# Patient Record
Sex: Female | Born: 1965 | Race: Black or African American | Hispanic: No | Marital: Married | Smoking: Never smoker
Health system: Southern US, Community
[De-identification: ages and names within clinical notes are randomized; demographics above are authoritative.]

## PROBLEM LIST (undated history)

## (undated) DIAGNOSIS — I10 Essential (primary) hypertension: Secondary | ICD-10-CM

## (undated) DIAGNOSIS — K219 Gastro-esophageal reflux disease without esophagitis: Secondary | ICD-10-CM

## (undated) DIAGNOSIS — Z8489 Family history of other specified conditions: Secondary | ICD-10-CM

## (undated) HISTORY — PX: TUBAL LIGATION: SHX77

## (undated) HISTORY — DX: Gastro-esophageal reflux disease without esophagitis: K21.9

---
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
## 2007-08-26 ENCOUNTER — Ambulatory Visit: Payer: Self-pay

## 2007-09-29 ENCOUNTER — Ambulatory Visit: Payer: Self-pay

## 2008-07-21 ENCOUNTER — Ambulatory Visit: Payer: Self-pay

## 2009-09-23 ENCOUNTER — Ambulatory Visit: Payer: Self-pay | Admitting: Internal Medicine

## 2010-03-29 ENCOUNTER — Emergency Department: Payer: Self-pay | Admitting: Emergency Medicine

## 2010-07-21 ENCOUNTER — Ambulatory Visit: Payer: Self-pay | Admitting: Family Medicine

## 2011-06-06 ENCOUNTER — Ambulatory Visit: Payer: Self-pay | Admitting: Family Medicine

## 2011-06-07 ENCOUNTER — Ambulatory Visit: Payer: Self-pay | Admitting: Family Medicine

## 2011-06-26 ENCOUNTER — Ambulatory Visit: Payer: Self-pay | Admitting: Family Medicine

## 2011-09-20 ENCOUNTER — Ambulatory Visit: Payer: Self-pay | Admitting: Family Medicine

## 2012-05-21 LAB — HM PAP SMEAR: HM PAP: NEGATIVE

## 2012-09-26 ENCOUNTER — Ambulatory Visit: Payer: Self-pay | Admitting: Family Medicine

## 2013-01-06 ENCOUNTER — Ambulatory Visit: Payer: Self-pay | Admitting: Family Medicine

## 2013-01-10 ENCOUNTER — Ambulatory Visit: Payer: Self-pay | Admitting: Family Medicine

## 2013-01-15 ENCOUNTER — Ambulatory Visit: Payer: Self-pay | Admitting: Family Medicine

## 2013-10-02 ENCOUNTER — Ambulatory Visit: Payer: Self-pay | Admitting: Family Medicine

## 2013-10-17 ENCOUNTER — Emergency Department: Payer: Self-pay | Admitting: Emergency Medicine

## 2013-10-17 LAB — CBC
HCT: 39.1 % (ref 35.0–47.0)
HGB: 13.3 g/dL (ref 12.0–16.0)
MCV: 93 fL (ref 80–100)
Platelet: 247 10*3/uL (ref 150–440)
RDW: 13 % (ref 11.5–14.5)
WBC: 5.3 10*3/uL (ref 3.6–11.0)

## 2013-10-17 LAB — TROPONIN I: Troponin-I: <0.02 ng/mL

## 2013-10-17 LAB — BASIC METABOLIC PANEL
Calcium, Total: 8.4 mg/dL — ABNORMAL LOW (ref 8.5–10.1)
Chloride: 108 mmol/L — ABNORMAL HIGH (ref 98–107)
Co2: 25 mmol/L (ref 21–32)
Glucose: 99 mg/dL (ref 65–99)
Osmolality: 275 (ref 275–301)

## 2014-01-20 ENCOUNTER — Ambulatory Visit: Payer: Self-pay | Admitting: Family Medicine

## 2014-01-21 HISTORY — PX: BIOPSY ENDOMETRIAL: PRO11

## 2014-02-26 ENCOUNTER — Emergency Department: Payer: Self-pay | Admitting: Emergency Medicine

## 2014-02-26 LAB — CBC
HCT: 37.9 % (ref 35.0–47.0)
HGB: 12.7 g/dL (ref 12.0–16.0)
MCH: 32.2 pg (ref 26.0–34.0)
MCHC: 33.4 g/dL (ref 32.0–36.0)
MCV: 96 fL (ref 80–100)
PLATELETS: 228 10*3/uL (ref 150–440)
RBC: 3.93 10*6/uL (ref 3.80–5.20)
RDW: 13.2 % (ref 11.5–14.5)
WBC: 6.8 10*3/uL (ref 3.6–11.0)

## 2014-02-26 LAB — COMPREHENSIVE METABOLIC PANEL
AST: 34 U/L (ref 15–37)
Albumin: 3.8 g/dL (ref 3.4–5.0)
Alkaline Phosphatase: 85 U/L
Anion Gap: 4 — ABNORMAL LOW (ref 7–16)
BUN: 10 mg/dL (ref 7–18)
Bilirubin,Total: 0.6 mg/dL (ref 0.2–1.0)
CO2: 26 mmol/L (ref 21–32)
Calcium, Total: 9 mg/dL (ref 8.5–10.1)
Chloride: 109 mmol/L — ABNORMAL HIGH (ref 98–107)
Creatinine: 0.77 mg/dL (ref 0.60–1.30)
EGFR (African American): >60
EGFR (Non-African Amer.): >60
GLUCOSE: 90 mg/dL (ref 65–99)
Osmolality: 276 (ref 275–301)
Potassium: 3.8 mmol/L (ref 3.5–5.1)
SGPT (ALT): 30 U/L (ref 12–78)
Sodium: 139 mmol/L (ref 136–145)
Total Protein: 7.4 g/dL (ref 6.4–8.2)

## 2014-02-26 LAB — TROPONIN I: Troponin-I: <0.02 ng/mL

## 2014-02-26 LAB — LIPASE, BLOOD: Lipase: 146 U/L (ref 73–393)

## 2014-03-17 LAB — HM COLONOSCOPY

## 2014-10-12 ENCOUNTER — Ambulatory Visit: Payer: Self-pay | Admitting: Family Medicine

## 2015-02-28 ENCOUNTER — Emergency Department
Admission: EM | Admit: 2015-02-28 | Discharge: 2015-02-28 | Disposition: A | Discharge status: Home / Self Care | Payer: 59 | Attending: Emergency Medicine | Admitting: Emergency Medicine

## 2015-02-28 ENCOUNTER — Emergency Department: Payer: 59

## 2015-02-28 ENCOUNTER — Encounter: Payer: Self-pay | Admitting: Emergency Medicine

## 2015-02-28 ENCOUNTER — Ambulatory Visit: Admit: 2015-02-28 | Payer: 59

## 2015-02-28 DIAGNOSIS — S99921A Unspecified injury of right foot, initial encounter: Secondary | ICD-10-CM | POA: Diagnosis present

## 2015-02-28 DIAGNOSIS — Y9389 Activity, other specified: Secondary | ICD-10-CM | POA: Insufficient documentation

## 2015-02-28 DIAGNOSIS — Y9289 Other specified places as the place of occurrence of the external cause: Secondary | ICD-10-CM | POA: Diagnosis not present

## 2015-02-28 DIAGNOSIS — Y998 Other external cause status: Secondary | ICD-10-CM | POA: Diagnosis not present

## 2015-02-28 DIAGNOSIS — M79671 Pain in right foot: Secondary | ICD-10-CM

## 2015-02-28 DIAGNOSIS — X58XXXA Exposure to other specified factors, initial encounter: Secondary | ICD-10-CM | POA: Diagnosis not present

## 2015-02-28 NOTE — ED Provider Notes (Signed)
[facility] Emergency Department Provider Note  ____________________________________________  Time seen: Approximately 3:15 AM  I have reviewed the triage vital signs and the nursing notes.   HISTORY  Chief Complaint Foot Pain    HPI Ashley Ballard is a 49 y.o. female with a medical history of only obesity who presents with right foot pain after rolling her foot about 20 hours ago.  She is ambulatory with a slight limp.  She describes the pain is mild to moderate.  She is able to weight-bear without difficulty.  She did not hear or feel a pop or snap.  She does not have any swelling of the foot or joint.  She did not sustain any other injuries.   History reviewed. No pertinent past medical history.  There are no active problems to display for this patient.   Past Surgical History  Procedure Laterality Date  . Cholecystectomy    . Tubal ligation      No current outpatient prescriptions on file.  Allergies Review of patient's allergies indicates no known allergies.  History reviewed. No pertinent family history.  Social History History  Substance Use Topics  . Smoking status: Never Smoker   . Smokeless tobacco: Not on file  . Alcohol Use: No    Review of Systems Cardiovascular: Denies chest pain. Respiratory: Denies shortness of breath. Gastrointestinal: No abdominal pain.  No nausea, no vomiting.  No diarrhea.  No constipation. Musculoskeletal: Right foot pain, no other injuries  ____________________________________________   PHYSICAL EXAM:  VITAL SIGNS: ED Triage Vitals  Enc Vitals Group     BP 02/28/15 0214 145/92 mmHg     Pulse Rate 02/28/15 0214 93     Resp 02/28/15 0214 18     Temp 02/28/15 0214 98.2 F (36.8 C)     Temp Source 02/28/15 0214 Oral     SpO2 02/28/15 0214 100 %     Weight 02/28/15 0214 230 lb (104.327 kg)     Height 02/28/15 0214 5\' 5"  (1.651 m)     Head Cir --      Peak Flow --      Pain Score 02/28/15  0214 6     Pain Loc --      Pain Edu? --      Excl. in GC? --     Constitutional: Alert and oriented. Well appearing and in no acute distress. Eyes: Conjunctivae are normal. PERRL. EOMI. Head: Atraumatic. Cardiovascular: Normal rate, regular rhythm.  Respiratory: Normal respiratory effort.  No retractions. Lungs CTAB. Musculoskeletal: Mild tenderness to palpation of the medial aspect of the right foot.  No swelling, no ecchymosis.  Normal range of motion.  Slight limp with ambulation.  No ligamentous laxity.   Neurologic:  Normal speech and language. No gross focal neurologic deficits are appreciated. Speech is normal. No gait instability. Skin:  Skin is warm, dry and intact. No rash or injury noted. Psychiatric: Mood and affect are normal. Speech and behavior are normal.  ____________________________________________   LABS (all labs ordered are listed, but only abnormal results are displayed)  Labs Reviewed - No data to display ____________________________________________  RADIOLOGY  Dg Foot Complete Right  02/28/2015   CLINICAL DATA:  Midfoot pain after walking in heels and twisting foot 1 day ago.  EXAM: RIGHT FOOT COMPLETE - 3+ VIEW  COMPARISON:  None.  FINDINGS: There is no evidence of fracture or dislocation. There is no evidence of arthropathy or other focal bone abnormality. Soft tissues are  unremarkable.  IMPRESSION: Negative.   Electronically Signed   By: Burman NievesWilliam  Stevens M.D.   On: 02/28/2015 02:40   ____________________________________________   INITIAL IMPRESSION / ASSESSMENT AND PLAN / ED COURSE  Pertinent labs & imaging results that were available during my care of the patient were reviewed by me and considered in my medical decision making (see chart for details).  Foot pain without any evidence of sprain, fracture, or ligamentous injury.  No concern for a Lisfranc fracture given minor mechanism.  Recommended RICE and outpatient  follow-up. ____________________________________________   FINAL CLINICAL IMPRESSION(S) / ED DIAGNOSES  Final diagnoses:  Foot pain, right     Loleta Roseory Fabrice Dyal, MD 02/28/15 [id number]

## 2015-02-28 NOTE — ED Notes (Signed)
Ice Pack applied to Right Foot and elevated on pillow.

## 2015-02-28 NOTE — ED Notes (Signed)
Pt states she rolled her foot accidently and is having pain. Pt ambulatory.

## 2015-02-28 NOTE — Discharge Instructions (Signed)
Fortunately did not sustain any fractures of the bones in your foot and it does not appear that you have an ankle sprain.  Please read through the included information about rest, ice, compression, elevation, and follow up with your primary care doctor as needed.  Take Tylenol and ibuprofen as needed for pain.

## 2015-04-07 DIAGNOSIS — M5440 Lumbago with sciatica, unspecified side: Secondary | ICD-10-CM | POA: Insufficient documentation

## 2015-04-07 DIAGNOSIS — R9431 Abnormal electrocardiogram [ECG] [EKG]: Secondary | ICD-10-CM | POA: Insufficient documentation

## 2015-04-07 DIAGNOSIS — R030 Elevated blood-pressure reading, without diagnosis of hypertension: Secondary | ICD-10-CM

## 2015-04-07 DIAGNOSIS — E669 Obesity, unspecified: Secondary | ICD-10-CM | POA: Insufficient documentation

## 2015-04-07 DIAGNOSIS — E559 Vitamin D deficiency, unspecified: Secondary | ICD-10-CM | POA: Insufficient documentation

## 2015-04-07 DIAGNOSIS — N6019 Diffuse cystic mastopathy of unspecified breast: Secondary | ICD-10-CM | POA: Insufficient documentation

## 2015-04-07 DIAGNOSIS — IMO0001 Reserved for inherently not codable concepts without codable children: Secondary | ICD-10-CM | POA: Insufficient documentation

## 2015-04-07 DIAGNOSIS — R52 Pain, unspecified: Secondary | ICD-10-CM | POA: Insufficient documentation

## 2015-04-07 DIAGNOSIS — K219 Gastro-esophageal reflux disease without esophagitis: Secondary | ICD-10-CM | POA: Insufficient documentation

## 2015-04-07 DIAGNOSIS — N950 Postmenopausal bleeding: Secondary | ICD-10-CM | POA: Insufficient documentation

## 2015-04-08 ENCOUNTER — Ambulatory Visit (INDEPENDENT_AMBULATORY_CARE_PROVIDER_SITE_OTHER): Payer: 59 | Admitting: Physician Assistant

## 2015-04-08 ENCOUNTER — Encounter: Payer: Self-pay | Admitting: Physician Assistant

## 2015-04-08 VITALS — BP 144/98 | HR 92 | Temp 97.9°F | Resp 18 | Wt 231.6 lb

## 2015-04-08 DIAGNOSIS — R030 Elevated blood-pressure reading, without diagnosis of hypertension: Secondary | ICD-10-CM | POA: Diagnosis not present

## 2015-04-08 DIAGNOSIS — IMO0001 Reserved for inherently not codable concepts without codable children: Secondary | ICD-10-CM

## 2015-04-08 DIAGNOSIS — J019 Acute sinusitis, unspecified: Secondary | ICD-10-CM | POA: Diagnosis not present

## 2015-04-08 DIAGNOSIS — B9689 Other specified bacterial agents as the cause of diseases classified elsewhere: Secondary | ICD-10-CM

## 2015-04-08 MED ORDER — AMOXICILLIN 500 MG PO CAPS: 500.0000 mg | ORAL_CAPSULE | Freq: Three times a day (TID) | ORAL | Status: DC

## 2015-04-08 MED ORDER — MONTELUKAST SODIUM 10 MG PO TABS: 10.0000 mg | ORAL_TABLET | Freq: Every day | ORAL | Status: DC

## 2015-04-08 MED ORDER — FLUTICASONE PROPIONATE 50 MCG/ACT NA SUSP: 2.0000 | Freq: Every day | NASAL | Status: DC

## 2015-04-08 NOTE — Patient Instructions (Signed)

## 2015-04-08 NOTE — Progress Notes (Signed)
   Subjective:    Patient ID: Ashley Ballard, female    DOB: Sep 14, 1966, 49 y.o.   MRN: [id number]  Sinusitis This is a new problem. The current episode started in the past 7 days. The problem is unchanged. There has been no fever. She is experiencing no pain. Associated symptoms include congestion, headaches and sinus pressure. Pertinent negatives include no chills, coughing, diaphoresis, ear pain, hoarse voice, neck pain, shortness of breath, sneezing, sore throat or swollen glands. Past treatments include oral decongestants. The treatment provided no relief.      Review of Systems  Constitutional: Negative for fever, chills, diaphoresis and fatigue.  HENT: Positive for congestion, sinus pressure and voice change (mild hoarseness). Negative for ear discharge, ear pain, hoarse voice, postnasal drip, rhinorrhea, sneezing, sore throat, tinnitus and trouble swallowing.   Eyes: Negative for photophobia, pain, discharge and redness.  Respiratory: Negative for cough, chest tightness, shortness of breath and wheezing.   Cardiovascular: Negative for chest pain and palpitations.  Gastrointestinal: Negative for nausea, vomiting, diarrhea and constipation.  Musculoskeletal: Negative for arthralgias, neck pain and neck stiffness.  Neurological: Positive for headaches. Negative for dizziness, seizures, syncope, weakness and light-headedness.       Objective:   Physical Exam  Constitutional: She appears well-developed and well-nourished. No distress.  HENT:  Head: Normocephalic and atraumatic.  Right Ear: Hearing, tympanic membrane, external ear and ear canal normal.  Left Ear: Hearing, tympanic membrane, external ear and ear canal normal.  Nose: Mucosal edema present. No rhinorrhea. Right sinus exhibits maxillary sinus tenderness. Right sinus exhibits no frontal sinus tenderness. Left sinus exhibits maxillary sinus tenderness. Left sinus exhibits no frontal sinus tenderness.  Mouth/Throat: Uvula is  midline, oropharynx is clear and moist and mucous membranes are normal. No oropharyngeal exudate.  Eyes: Conjunctivae are normal. Pupils are equal, round, and reactive to light. Right eye exhibits no discharge. Left eye exhibits no discharge.  Neck: Normal range of motion. Neck supple. No tracheal deviation present. No thyromegaly present.  Cardiovascular: Normal rate, regular rhythm and intact distal pulses.  Exam reveals no gallop and no friction rub.   No murmur heard. Pulmonary/Chest: Effort normal and breath sounds normal. No respiratory distress. She has no wheezes. She has no rales. She exhibits no tenderness.  Lymphadenopathy:    She has no cervical adenopathy.  Skin: She is not diaphoretic.  Vitals reviewed.         Assessment & Plan:  1. Acute bacterial sinusitis Will treat with amoxicillin to help clear sinuses.  Advised to try afrin nose spray but to not use for more than 3 consecutive days.  Added singulair to daily allergy routine of flonase and claritin for better symptom control. - fluticasone (FLONASE) 50 MCG/ACT nasal spray; Place 2 sprays into both nostrils daily.  Dispense: 120 g; Refill: 3 - amoxicillin (AMOXIL) 500 MG capsule; Take 1 capsule (500 mg total) by mouth 3 (three) times daily.  Dispense: 30 capsule; Refill: 0 - montelukast (SINGULAIR) 10 MG tablet; Take 1 tablet (10 mg total) by mouth at bedtime.  Dispense: 30 tablet; Refill: 3  2. Elevated BP Advised to check BP at home and keep a log of readings.  If consistently elevated like today she is to call the office for further discussion.

## 2015-04-15 ENCOUNTER — Encounter: Payer: Self-pay | Admitting: Family Medicine

## 2015-04-15 ENCOUNTER — Ambulatory Visit (INDEPENDENT_AMBULATORY_CARE_PROVIDER_SITE_OTHER): Payer: 59 | Admitting: Family Medicine

## 2015-04-15 VITALS — BP 126/84 | HR 100 | Temp 98.0°F | Resp 16 | Wt 226.0 lb

## 2015-04-15 DIAGNOSIS — J0110 Acute frontal sinusitis, unspecified: Secondary | ICD-10-CM

## 2015-04-15 MED ORDER — CEFDINIR 300 MG PO CAPS: 300.0000 mg | ORAL_CAPSULE | Freq: Two times a day (BID) | ORAL | Status: DC

## 2015-04-15 NOTE — Progress Notes (Signed)
Subjective:     Patient ID: Ashley Ballard, female   DOB: [birth year], 49 y.o.   MRN: [id number]  HPI  Chief Complaint  Patient presents with  . Sinusitis    Pt was seen on 04/08/2015 by Antony Contras for sinusitis. Pt was prescribed Amoxicillin, without relief.  States sinus pressure and congestion are still present but not draining. Used Afrin Nasal Spray for two days only.   Review of Systems  Constitutional: Negative for fever and chills.  Respiratory: Negative for cough.        Objective:   Physical Exam  Constitutional: She appears well-developed and well-nourished. No distress.  HENT:  Right Ear: Tympanic membrane is not erythematous (dull in appearance).  Left Ear: Tympanic membrane normal.  Pulmonary/Chest: Breath sounds normal.  mild frontal sinus tenderness on percussion     Assessment:    1. Acute frontal sinusitis, recurrence not specified - cefdinir (OMNICEF) 300 MG capsule; Take 1 capsule (300 mg total) by mouth 2 (two) times daily.  Dispense: 20 capsule; Refill: 0    Plan:     Add Mucinex D

## 2015-04-15 NOTE — Patient Instructions (Signed)
Discussed use of Mucinex D and holding Singulair for now

## 2015-04-27 ENCOUNTER — Ambulatory Visit (INDEPENDENT_AMBULATORY_CARE_PROVIDER_SITE_OTHER): Payer: 59 | Admitting: Family Medicine

## 2015-04-27 ENCOUNTER — Encounter: Payer: Self-pay | Admitting: Family Medicine

## 2015-04-27 VITALS — BP 128/88 | HR 80 | Temp 98.0°F | Resp 16 | Ht 65.0 in | Wt 231.0 lb

## 2015-04-27 DIAGNOSIS — J0100 Acute maxillary sinusitis, unspecified: Secondary | ICD-10-CM

## 2015-04-27 DIAGNOSIS — M5440 Lumbago with sciatica, unspecified side: Secondary | ICD-10-CM

## 2015-04-27 MED ORDER — IBUPROFEN 800 MG PO TABS: 800.0000 mg | ORAL_TABLET | Freq: Three times a day (TID) | ORAL | Status: DC | PRN

## 2015-04-27 MED ORDER — PREDNISONE 10 MG PO TABS: ORAL_TABLET | ORAL | Status: DC

## 2015-04-27 MED ORDER — DOXYCYCLINE HYCLATE 100 MG PO TABS: 100.0000 mg | ORAL_TABLET | Freq: Two times a day (BID) | ORAL | Status: DC

## 2015-04-27 NOTE — Progress Notes (Signed)
Subjective:    Patient ID: Ashley Ballard, female    DOB: 09-Dec-1965, 49 y.o.   MRN: [id number]  Sinusitis This is a recurrent problem. The current episode started 1 to 4 weeks ago. There has been no fever. Associated symptoms include congestion, coughing, headaches and sinus pressure. Pertinent negatives include no ear pain, hoarse voice, shortness of breath, sore throat or swollen glands. Past treatments include antibiotics. The treatment provided moderate relief.  Patient reports that Omnicef did help her symptoms but has not resolved completly. Patient reports congestion is mostly in her head. Patient reports she is using OTC Mucinex D once a day as needed.    Review of Systems  HENT: Positive for congestion and sinus pressure. Negative for ear pain, hoarse voice and sore throat.   Respiratory: Positive for cough. Negative for shortness of breath.   Neurological: Positive for headaches.   Patient Active Problem List   Diagnosis Date Noted  . Abnormal ECG 04/07/2015  . Blood pressure elevated 04/07/2015  . 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 Acid reflux 04/07/2015  . Low back pain with sciatica 04/07/2015  . Adiposity 04/07/2015  . Hemorrhage, postmenopausal 04/07/2015  . Pain 04/07/2015  . Avitaminosis D 04/07/2015   Past Medical History  Diagnosis Date  . GERD (gastroesophageal reflux disease)    Current Outpatient Prescriptions on File Prior to Visit  Medication Sig  . calcium carbonate (OS-CAL) 600 MG TABS tablet Take 1 tablet by mouth daily.  . cholecalciferol (VITAMIN D) 1000 UNITS tablet Take 5 tablets by mouth daily.  . cyclobenzaprine (FLEXERIL) 5 MG tablet 1 tablet daily as needed.  Marland Kitchen dexlansoprazole (DEXILANT) 60 MG capsule Take 1 capsule by mouth daily.  . fluticasone (FLONASE) 50 MCG/ACT nasal spray Place 2 sprays into both nostrils daily.  Marland Kitchen ibuprofen (ADVIL,MOTRIN) 800 MG tablet Take 1 tablet by mouth 3 (three) times daily.  Marland Kitchen loratadine (CLARITIN REDITABS) 10  MG dissolvable tablet Take 1 tablet by mouth daily.  . montelukast (SINGULAIR) 10 MG tablet Take 1 tablet (10 mg total) by mouth at bedtime.  Marland Kitchen amoxicillin (AMOXIL) 500 MG capsule Take 1 capsule (500 mg total) by mouth 3 (three) times daily. (Patient not taking: Reported on 04/27/2015)  . cefdinir (OMNICEF) 300 MG capsule Take 1 capsule (300 mg total) by mouth 2 (two) times daily. (Patient not taking: Reported on 04/27/2015)   No current facility-administered medications on file prior to visit.   No Known Allergies Past Surgical History  Procedure Laterality Date  . Cholecystectomy    . Tubal ligation    . Biopsy endometrial  01/21/2014  . Breast cyst excision Right 1983   History   Social History  . Marital Status: Married    Spouse Name: Casimiro Needle  . Number of Children: 2  . Years of Education: N/A   Occupational History  . full time    Social History Main Topics  . Smoking status: Never Smoker   . Smokeless tobacco: Never Used  . Alcohol Use: No  . Drug Use: No  . Sexual Activity: Not on file   Other Topics Concern  . Not on file   Social History Narrative   Family History  Problem Relation Age of Onset  . Hypertension Mother   . Diabetes Mother   . Heart attack Paternal Uncle   . Kidney cancer Maternal Grandfather   . Pneumonia Paternal Grandmother   . Leukemia Paternal Grandfather        Objective:   Physical Exam  Constitutional: She is oriented to person, place, and time. She appears well-developed and well-nourished.  HENT:  Head: Normocephalic and atraumatic.  Right Ear: External ear normal. Tympanic membrane is retracted.  Left Ear: Tympanic membrane and external ear normal.  Nose: Mucosal edema and rhinorrhea present. Right sinus exhibits no maxillary sinus tenderness. Left sinus exhibits no maxillary sinus tenderness.  Mouth/Throat: Uvula is midline and oropharynx is clear and moist.  Eyes: Conjunctivae and EOM are normal. Pupils are equal, round, and  reactive to light.  Neck: Normal range of motion. Neck supple.  Cardiovascular: Normal rate and regular rhythm.   Pulmonary/Chest: Effort normal and breath sounds normal. She has no wheezes. She has no rales.  Neurological: She is alert and oriented to person, place, and time.    Blood pressure 128/88, pulse 80, temperature 98 F (36.7 C), temperature source Oral, resp. rate 16, height 5\' 5"  (1.651 m), weight 231 lb (104.781 kg), last menstrual period 03/14/2015, SpO2 98 %.       Assessment & Plan:   1. Acute maxillary sinusitis, recurrence not specified Better but not resolved. Will try medication as below. Consider repeat Prednisone if improves.  ENT referral if not improved.  - doxycycline (VIBRA-TABS) 100 MG tablet; Take 1 tablet (100 mg total) by mouth 2 (two) times daily.  Dispense: 20 tablet; Refill: 0 - predniSONE (DELTASONE) 10 MG tablet; 6 po for 1 day and then 5 po for 1 day and then 4 po for 1 day and 3 po for 1 day and then 2 po for 1 day and then 1 po for 1 day.  Dispense: 21 tablet; Refill: 0  2. Low back pain with sciatica, sciatica laterality unspecified, unspecified back pain laterality To be used as needed. Knows not to take with Prednisone.  - ibuprofen (ADVIL,MOTRIN) 800 MG tablet; Take 1 tablet (800 mg total) by mouth every 8 (eight) hours as needed.  Dispense: 90 tablet; Refill: 0  Lorie PhenixNancy Aunya Lemler, MD

## 2015-05-02 ENCOUNTER — Telehealth: Payer: Self-pay | Admitting: Family Medicine

## 2015-05-02 DIAGNOSIS — J328 Other chronic sinusitis: Secondary | ICD-10-CM

## 2015-05-02 DIAGNOSIS — J329 Chronic sinusitis, unspecified: Secondary | ICD-10-CM | POA: Insufficient documentation

## 2015-05-02 NOTE — Telephone Encounter (Signed)
Not sure what else to do. Put in referral to ENT to evaluate and treat.

## 2015-05-02 NOTE — Telephone Encounter (Signed)
Patient advised and will attend ENT appointment.

## 2015-05-02 NOTE — Telephone Encounter (Signed)
Pt would like to come in for a OV with Dr. Elease HashimotoMaloney this week to recheck her sinus problems. Can see be worked in if so, when? Thanks TNP

## 2015-05-02 NOTE — Telephone Encounter (Signed)
Pt returned call. Thanks TNP °

## 2015-05-03 ENCOUNTER — Telehealth: Payer: Self-pay | Admitting: Family Medicine

## 2015-05-03 ENCOUNTER — Encounter: Payer: Self-pay | Admitting: Family Medicine

## 2015-05-03 ENCOUNTER — Ambulatory Visit (INDEPENDENT_AMBULATORY_CARE_PROVIDER_SITE_OTHER): Payer: 59 | Admitting: Family Medicine

## 2015-05-03 VITALS — BP 136/80 | HR 92 | Temp 98.1°F | Resp 16 | Wt 230.0 lb

## 2015-05-03 DIAGNOSIS — J328 Other chronic sinusitis: Secondary | ICD-10-CM | POA: Diagnosis not present

## 2015-05-03 MED ORDER — CEFDINIR 300 MG PO CAPS: 300.0000 mg | ORAL_CAPSULE | Freq: Two times a day (BID) | ORAL | Status: DC

## 2015-05-03 NOTE — Telephone Encounter (Signed)
Made appointment for 05/03/2015 at 3:45.   Thanks,   -Vernona RiegerLaura

## 2015-05-03 NOTE — Progress Notes (Signed)
Patient ID: Ashley NavyFelicia B Nowakowski, female   DOB: 26-Jun-1966, 49 y.o.   MRN: [id number]       Patient: Ashley Ballard Female    DOB: 26-Jun-1966   49 y.o.   MRN: [id number] Visit Date: 05/03/2015  Today's Provider: Lorie PhenixNancy Culver Feighner, MD   Chief Complaint  Patient presents with  . Sinusitis   Subjective:    Sinusitis This is a chronic problem. The current episode started more than 1 month ago. The problem has been gradually worsening since onset. There has been no fever. Associated symptoms include chills, congestion, coughing, ear pain (Worse on the right.), headaches, neck pain (Right sided neck pain), sinus pressure and a sore throat. Pertinent negatives include no diaphoresis, shortness of breath or sneezing. Past treatments include antibiotics and spray decongestants.   Did stop Claritin and Singulair while on antibiotics.      No Known Allergies Previous Medications   CALCIUM CARBONATE (OS-CAL) 600 MG TABS TABLET    Take 1 tablet by mouth daily.   CHOLECALCIFEROL (VITAMIN D) 1000 UNITS TABLET    Take 5 tablets by mouth daily.   CYCLOBENZAPRINE (FLEXERIL) 5 MG TABLET    1 tablet daily as needed.   DEXLANSOPRAZOLE (DEXILANT) 60 MG CAPSULE    Take 1 capsule by mouth daily.   DOXYCYCLINE (VIBRA-TABS) 100 MG TABLET    Take 1 tablet (100 mg total) by mouth 2 (two) times daily.   FLUTICASONE (FLONASE) 50 MCG/ACT NASAL SPRAY    Place 2 sprays into both nostrils daily.   IBUPROFEN (ADVIL,MOTRIN) 800 MG TABLET    Take 1 tablet (800 mg total) by mouth every 8 (eight) hours as needed.   LORATADINE (CLARITIN REDITABS) 10 MG DISSOLVABLE TABLET    Take 1 tablet by mouth daily.   MONTELUKAST (SINGULAIR) 10 MG TABLET    Take 1 tablet (10 mg total) by mouth at bedtime.    Review of Systems  Constitutional: Positive for chills and fatigue. Negative for fever, diaphoresis, activity change, appetite change and unexpected weight change.  HENT: Positive for congestion, ear pain (Worse on the right.), postnasal  drip, sinus pressure and sore throat. Negative for ear discharge, hearing loss, mouth sores, nosebleeds, rhinorrhea, sneezing, tinnitus, trouble swallowing and voice change.   Respiratory: Positive for cough. Negative for apnea, choking, chest tightness, shortness of breath, wheezing and stridor.   Cardiovascular: Negative for chest pain, palpitations and leg swelling.  Gastrointestinal: Negative for nausea, vomiting, abdominal pain, diarrhea, constipation, blood in stool, abdominal distention, anal bleeding and rectal pain.  Musculoskeletal: Positive for neck pain (Right sided neck pain).  Neurological: Positive for headaches. Negative for dizziness, facial asymmetry and light-headedness.    History  Substance Use Topics  . Smoking status: Never Smoker   . Smokeless tobacco: Never Used  . Alcohol Use: No   Objective:   BP 136/80 mmHg  Pulse 92  Temp(Src) 98.1 F (36.7 C) (Oral)  Resp 16  Wt 230 lb (104.327 kg)  LMP 03/14/2015 (Exact Date)  Physical Exam  Constitutional: She is oriented to person, place, and time. She appears well-developed and well-nourished.  HENT:  Head: Normocephalic and atraumatic.  Right Ear: External ear normal.  Left Ear: Tympanic membrane and external ear normal.  Nose: Mucosal edema and rhinorrhea present. Right sinus exhibits no maxillary sinus tenderness. Left sinus exhibits no maxillary sinus tenderness.  Mouth/Throat: Uvula is midline and oropharynx is clear and moist.  Eyes: Conjunctivae and EOM are normal. Pupils are equal, round, and reactive to  light.  Neck: Normal range of motion. Neck supple.  Tender over right sternocleidomastoid.   Cardiovascular: Normal rate and regular rhythm.   Pulmonary/Chest: Effort normal and breath sounds normal. She has no wheezes. She has no rales.  Neurological: She is alert and oriented to person, place, and time.        Assessment & Plan:     1. Other chronic sinusitis Will finish antibiotic and add back  the Singulair and Mucinex D. Will also restart Omnicef for 2 weeks course. Follow with ENT as scheduled.   Follow up: Follow up as needed.       Lorie Phenix, MD  [facility]

## 2015-05-03 NOTE — Telephone Encounter (Signed)
Pt called wantingto be seen this afternoon for sinus problems.  When can we work her in .  Thanks, TP

## 2015-07-15 ENCOUNTER — Ambulatory Visit: Payer: 59 | Admitting: Family Medicine

## 2015-07-22 ENCOUNTER — Encounter: Payer: Self-pay | Admitting: Family Medicine

## 2015-07-22 ENCOUNTER — Ambulatory Visit (INDEPENDENT_AMBULATORY_CARE_PROVIDER_SITE_OTHER): Payer: 59 | Admitting: Family Medicine

## 2015-07-22 VITALS — BP 122/98 | HR 99 | Temp 97.9°F | Resp 18 | Wt 237.6 lb

## 2015-07-22 DIAGNOSIS — IMO0001 Reserved for inherently not codable concepts without codable children: Secondary | ICD-10-CM

## 2015-07-22 DIAGNOSIS — E669 Obesity, unspecified: Secondary | ICD-10-CM | POA: Diagnosis not present

## 2015-07-22 DIAGNOSIS — J019 Acute sinusitis, unspecified: Secondary | ICD-10-CM | POA: Diagnosis not present

## 2015-07-22 DIAGNOSIS — J328 Other chronic sinusitis: Secondary | ICD-10-CM

## 2015-07-22 DIAGNOSIS — R030 Elevated blood-pressure reading, without diagnosis of hypertension: Secondary | ICD-10-CM | POA: Diagnosis not present

## 2015-07-22 DIAGNOSIS — B9689 Other specified bacterial agents as the cause of diseases classified elsewhere: Secondary | ICD-10-CM

## 2015-07-22 MED ORDER — MONTELUKAST SODIUM 10 MG PO TABS: 10.0000 mg | ORAL_TABLET | Freq: Every day | ORAL | Status: DC

## 2015-07-22 NOTE — Progress Notes (Signed)
Patient ID: Ashley Ballard, female   DOB: 1965-10-30, 49 y.o.   MRN: [id number]    Subjective:  HPI 49 yo female here regarding her increased BMI. [facility] [facility]. Also, walking at work. Also, starting to count her calories.   Is very frustrated as she has not lost any weight. Does think related to menopause. Has form for work. Also with increased blood pressure since she has gained weight  Does monitor as home but not sure if cuff is accurate.  Was elevated at last visit. Did not have increased blood pressure when weight was lower . Very  Motivated to loose weight.    Prior to Admission medications   Medication Sig Start Date End Date Taking? Authorizing Provider  calcium carbonate (OS-CAL) 600 MG TABS tablet Take 1 tablet by mouth daily. 11/08/11  Yes Historical Provider, MD  cholecalciferol (VITAMIN D) 1000 UNITS tablet Take 5 tablets by mouth daily. 05/26/12  Yes Historical Provider, MD  cyclobenzaprine (FLEXERIL) 5 MG tablet 1 tablet daily as needed. 01/22/15  Yes Historical Provider, MD  dexlansoprazole (DEXILANT) 60 MG capsule Take 1 capsule by mouth daily. 02/19/14  Yes Historical Provider, MD  fluticasone (FLONASE) 50 MCG/ACT nasal spray Place 2 sprays into both nostrils daily. 04/08/15  Yes Alessandra Bevels Burnette, PA-C  ibuprofen (ADVIL,MOTRIN) 800 MG tablet Take 1 tablet (800 mg total) by mouth every 8 (eight) hours as needed. 04/27/15  Yes Lorie Phenix, MD  loratadine (CLARITIN REDITABS) 10 MG dissolvable tablet Take 1 tablet by mouth daily.   Yes Historical Provider, MD  montelukast (SINGULAIR) 10 MG tablet Take 1 tablet (10 mg total) by mouth at bedtime. 04/08/15  Yes Margaretann Loveless, PA-C    Patient Active Problem List   Diagnosis Date Noted  . Chronic sinusitis 05/02/2015  . Abnormal ECG 04/07/2015  . Blood pressure elevated 04/07/2015  . 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 Acid reflux 04/07/2015  . Low back pain with sciatica 04/07/2015  . Adiposity 04/07/2015  .  Hemorrhage, postmenopausal 04/07/2015  . Pain 04/07/2015  . Avitaminosis D 04/07/2015    Past Medical History  Diagnosis Date  . GERD (gastroesophageal reflux disease)     Social History   Social History  . Marital Status: Married    Spouse Name: Casimiro Needle  . Number of Children: 2  . Years of Education: N/A   Occupational History  . full time    Social History Main Topics  . Smoking status: Never Smoker   . Smokeless tobacco: Never Used  . Alcohol Use: No  . Drug Use: No  . Sexual Activity: Not on file   Other Topics Concern  . Not on file   Social History Narrative    No Known Allergies  Review of Systems  Constitutional: Negative.   HENT: Negative.   Eyes: Negative.   Respiratory: Negative.   Cardiovascular: Negative.   Gastrointestinal: Negative.   Genitourinary: Negative.   Musculoskeletal: Negative.   Skin: Negative.   Neurological: Negative.   Endo/Heme/Allergies: Negative.   Psychiatric/Behavioral: Negative.     Immunization History  Administered Date(s) Administered  . Tdap 10/27/2008   Objective:  BP 122/98 mmHg  Pulse 99  Temp(Src) 97.9 F (36.6 C) (Oral)  Resp 18  Wt 237 lb 9.6 oz (107.775 kg)  SpO2 99%  Physical Exam  Constitutional: She is oriented to person, place, and time and well-developed, well-nourished, and in no distress.  Cardiovascular: Normal rate and regular rhythm.  Pulmonary/Chest: Effort normal and breath sounds normal.  Neurological: She is alert and oriented to person, place, and time.    Lab Results  Component Value Date   WBC 6.8 02/26/2014   HGB 12.7 02/26/2014   HCT 37.9 02/26/2014   PLT 228 02/26/2014   GLUCOSE 90 02/26/2014     Assessment and Plan :  1. Acute bacterial sinusitis Improved. Continue medication.  - montelukast (SINGULAIR) 10 MG tablet; Take 1 tablet (10 mg total) by mouth at bedtime.  Dispense: 90 tablet; Refill: 3  2. Other chronic sinusitis Improved as above.   3.  Adiposity Spent greater than 25 minutes discussing and counseling regarding healthy diet and exercise and ways to make appropriated lifestyle changes. Going to try higher protein, low carbohydrate diet.  Also filled out form for work. Recheck in one month.  4. Blood pressure elevated Assuming will improve with weight loss. Will recheck in 4 weeks.      Lorie Phenix MD [facility] 07/22/2015 4:05 PM

## 2015-08-19 ENCOUNTER — Ambulatory Visit (INDEPENDENT_AMBULATORY_CARE_PROVIDER_SITE_OTHER): Payer: 59 | Admitting: Family Medicine

## 2015-08-19 ENCOUNTER — Encounter: Payer: Self-pay | Admitting: Family Medicine

## 2015-08-19 VITALS — BP 140/92 | HR 72 | Temp 97.8°F | Resp 16 | Ht 65.5 in | Wt 231.0 lb

## 2015-08-19 DIAGNOSIS — E669 Obesity, unspecified: Secondary | ICD-10-CM | POA: Diagnosis not present

## 2015-08-19 DIAGNOSIS — R030 Elevated blood-pressure reading, without diagnosis of hypertension: Secondary | ICD-10-CM | POA: Diagnosis not present

## 2015-08-19 DIAGNOSIS — IMO0001 Reserved for inherently not codable concepts without codable children: Secondary | ICD-10-CM

## 2015-08-19 NOTE — Progress Notes (Signed)
Patient ID: Ashley Ballard, female   DOB: 02-11-1966, 49 y.o.   MRN: [id number]         Patient: Ashley Ballard Female    DOB: 02-11-1966   49 y.o.   MRN: [id number] Visit Date: 08/19/2015  Today's Provider: Lorie PhenixNancy Shakai Dolley, MD   Chief Complaint  Patient presents with  . Hypertension  . Obesity   Subjective:    Hypertension This is a new problem. The current episode started 1 to 4 weeks ago. The problem is unchanged. Condition status: Home blood pressure readings are 120-140's/ 80's. Pertinent negatives include no anxiety, blurred vision, chest pain, headaches, malaise/fatigue, neck pain, orthopnea, palpitations, peripheral edema, PND, shortness of breath or sweats.   Obesity: Patient complains of obesity. Patient cites health as reasons for wanting to lose weight.  Obesity History Weight in late teens: 130's lb. Period of greatest weight gain: at least 25 lb during mid adult years Lowest adult weight: 135 Highest adult weight: 237 Amount of time at present weight: 231 lb.   History of Weight Loss Efforts Greatest amount of weight lost: 100 lb over 18 months (that was about 30 years ago) Amount of time that loss was maintained: Pt maintained that weight until her last child.  Successful weight loss techniques attempted: Eating low carb and exercising regularly. Unsuccessful weight loss techniques attempted: None  Current Exercise Habits Pt is exercising 5-6 days a week.  Goes to the gym in the evening.    Current Eating Habits Number of regular meals per day: 3 Number of snacking episodes per day: 2 Who shops for food? patient Who prepares food? patient Who eats with patient? patient and spouse Binge behavior?: no Purge behavior? no Anorexic behavior? no Eating precipitated by stress? no  Other Potential Contributing Factors Use of alcohol: average 0 drinks/week Use of medications that may cause weight gain none History of past abuse? none Psych History:  None Comorbidities: none       No Known Allergies Previous Medications   CALCIUM CARBONATE (OS-CAL) 600 MG TABS TABLET    Take 1 tablet by mouth daily.   CHOLECALCIFEROL (VITAMIN D) 1000 UNITS TABLET    Take 5 tablets by mouth daily.   CYCLOBENZAPRINE (FLEXERIL) 5 MG TABLET    1 tablet daily as needed.   DEXLANSOPRAZOLE (DEXILANT) 60 MG CAPSULE    Take 1 capsule by mouth daily.   FLUTICASONE (FLONASE) 50 MCG/ACT NASAL SPRAY    Place 2 sprays into both nostrils daily.   IBUPROFEN (ADVIL,MOTRIN) 800 MG TABLET    Take 1 tablet (800 mg total) by mouth every 8 (eight) hours as needed.   LORATADINE (CLARITIN REDITABS) 10 MG DISSOLVABLE TABLET    Take 1 tablet by mouth daily.   MONTELUKAST (SINGULAIR) 10 MG TABLET    Take 1 tablet (10 mg total) by mouth at bedtime.    Review of Systems  Constitutional: Negative for fever, chills, malaise/fatigue, diaphoresis, activity change, appetite change, fatigue and unexpected weight change.  Eyes: Negative for blurred vision.  Respiratory: Negative for apnea, cough, choking, chest tightness, shortness of breath, wheezing and stridor.   Cardiovascular: Negative for chest pain, palpitations, orthopnea, leg swelling and PND.  Gastrointestinal: Negative.   Musculoskeletal: Negative for neck pain.  Neurological: Negative for dizziness, light-headedness and headaches.    Social History  Substance Use Topics  . Smoking status: Never Smoker   . Smokeless tobacco: Never Used  . Alcohol Use: No   Objective:   BP 140/92 mmHg  Pulse 72  Temp(Src) 97.8 F (36.6 C) (Oral)  Resp 16  Ht 5' 5.5" (1.664 m)  Wt 231 lb (104.781 kg)  BMI 37.84 kg/m2  LMP 03/14/2015 (Exact Date)  Physical Exam  Constitutional: She is oriented to person, place, and time. She appears well-developed and well-nourished.  Neurological: She is alert and oriented to person, place, and time.  Psychiatric: She has a normal mood and affect. Her behavior is normal. Judgment and  thought content normal.       Assessment & Plan:     1. Adiposity Improving with lifestyle changes. Continue lifestyle changes.   2. Blood pressure elevated Improved. Recheck in 6 weeks.   Lorie Phenix, MD        Lorie Phenix, MD  [facility]

## 2015-09-27 ENCOUNTER — Ambulatory Visit (INDEPENDENT_AMBULATORY_CARE_PROVIDER_SITE_OTHER): Payer: 59 | Admitting: Family Medicine

## 2015-09-27 ENCOUNTER — Encounter: Payer: Self-pay | Admitting: Family Medicine

## 2015-09-27 VITALS — BP 146/98 | HR 80 | Temp 97.7°F | Resp 16 | Wt 234.0 lb

## 2015-09-27 DIAGNOSIS — E669 Obesity, unspecified: Secondary | ICD-10-CM

## 2015-09-27 DIAGNOSIS — I10 Essential (primary) hypertension: Secondary | ICD-10-CM | POA: Diagnosis not present

## 2015-09-27 MED ORDER — HYDROCHLOROTHIAZIDE 12.5 MG PO TABS: 12.5000 mg | ORAL_TABLET | Freq: Every day | ORAL | Status: DC

## 2015-09-27 NOTE — Progress Notes (Signed)
Subjective:     Patient ID: Ashley Ballard, female   DOB: 1966-04-28, 49 y.o.   MRN: [id number]  Chief Complaint  Patient presents with  . Hypertension  . Obesity   Follow-up for hypertension and weight loss. Last seen 08/19/15 140/92 and 231 lbs.  Hypertension  Keeping high protein, low carb diet. Cooking all meals day before, eating greens. Drinking water, decaffinated tea with Splenda. Rare: diet soda  Exercising 5-6 days/week for 1 hr with water aerobics, ellipticals, walking track.  Sleeping: 7-8 hrs, restful. Never diagnosed with sleep apnea; denies snoring; denies waking multiple times at night gasping for breath.   Would be open to starting a blood pressure medication. Has never been on hypertension meds before. Denies fluid retention problems.   Weight loss See diet and exercise above. Measures her weight at home, and has been constant.   Review of Systems  Constitutional: Negative.   HENT: Negative.   Eyes: Negative.   Respiratory: Negative.   Cardiovascular: Negative.   Gastrointestinal: Negative.   Endocrine: Negative.   Genitourinary: Negative.   Musculoskeletal: Negative.   Skin: Negative.   Neurological: Negative.   Psychiatric/Behavioral: Negative.     Patient Active Problem List   Diagnosis Date Noted  . Chronic sinusitis 05/02/2015  . Abnormal ECG 04/07/2015  . Blood pressure elevated 04/07/2015  . 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 Acid reflux 04/07/2015  . Low back pain with sciatica 04/07/2015  . Adiposity 04/07/2015  . Hemorrhage, postmenopausal 04/07/2015  . Pain 04/07/2015  . Avitaminosis D 04/07/2015   Previous Medications   CALCIUM CARBONATE (OS-CAL) 600 MG TABS TABLET    Take 1 tablet by mouth daily.   CHOLECALCIFEROL (VITAMIN D) 1000 UNITS TABLET    Take 5 tablets by mouth daily.   CYCLOBENZAPRINE (FLEXERIL) 5 MG TABLET    1 tablet daily as needed.   DEXLANSOPRAZOLE (DEXILANT) 60 MG CAPSULE    Take 1 capsule by mouth daily.   FLUTICASONE (FLONASE) 50 MCG/ACT NASAL SPRAY    Place 2 sprays into both nostrils daily.   IBUPROFEN (ADVIL,MOTRIN) 800 MG TABLET    Take 1 tablet (800 mg total) by mouth every 8 (eight) hours as needed.   LORATADINE (CLARITIN REDITABS) 10 MG DISSOLVABLE TABLET    Take 1 tablet by mouth daily.   MONTELUKAST (SINGULAIR) 10 MG TABLET    Take 1 tablet (10 mg total) by mouth at bedtime.   No Known Allergies Past Surgical History  Procedure Laterality Date  . Cholecystectomy    . Tubal ligation    . Biopsy endometrial  01/21/2014  . Breast cyst excision Right 1983   Family History  Problem Relation Age of Onset  . Hypertension Mother   . Diabetes Mother   . Heart attack Paternal Uncle   . Kidney cancer Maternal Grandfather   . Pneumonia Paternal Grandmother   . Leukemia Paternal Grandfather    Social History   Social History  . Marital Status: Married    Spouse Name: Casimiro NeedleMichael  . Number of Children: 2  . Years of Education: N/A   Occupational History  . full time    Social History Main Topics  . Smoking status: Never Smoker   . Smokeless tobacco: Never Used  . Alcohol Use: No  . Drug Use: No  . Sexual Activity: Not on file   Other Topics Concern  . Not on file   Social History Narrative       Objective:   Physical Exam  Constitutional: She is oriented to person, place, and time. She appears well-developed and well-nourished. No distress.  HENT:  Head: Normocephalic and atraumatic.  Right Ear: External ear normal.  Left Ear: External ear normal.  Mouth/Throat: Oropharynx is clear and moist. No oropharyngeal exudate.  Eyes: EOM are normal. Pupils are equal, round, and reactive to light. Right eye exhibits no discharge. Left eye exhibits no discharge.  Neck: Normal range of motion. Neck supple.  Cardiovascular: Normal rate, regular rhythm and normal heart sounds.  Exam reveals no gallop and no friction rub.   No murmur heard. Pulmonary/Chest: Effort normal and breath  sounds normal. No respiratory distress. She has no wheezes.  Abdominal: Soft. Bowel sounds are normal. She exhibits no distension. There is no tenderness.  Musculoskeletal: Normal range of motion. She exhibits no edema.  Neurological: She is alert and oriented to person, place, and time. No cranial nerve deficit. She exhibits normal muscle tone.  Skin: Skin is warm and dry. She is not diaphoretic. No erythema.  Psychiatric: She has a normal mood and affect.  Vitals reviewed.   BP 146/98 mmHg  Pulse 80  Temp(Src) 97.7 F (36.5 C) (Oral)  Resp 16  Wt 234 lb (106.142 kg)  LMP 03/14/2015 (Exact Date)     Assessment:     Elevated blood pressure still, will diagnose hypertension at this time. Also, working on weight loss, but less successful than previously.     Plan:     1. Essential hypertension New problem. Unchanged.  Will start medication and recheck labs in 4 weeks. Will monitor BP, recheck OV in 3 months.   - hydrochlorothiazide (HYDRODIURIL) 12.5 MG tablet; Take 1 tablet (12.5 mg total) by mouth daily.  Dispense: 90 tablet; Refill: 3  2. Adiposity Will try Weight Watchers.   Recheck OV in 3 months.   Lorie Phenix, MD

## 2015-10-18 ENCOUNTER — Telehealth: Payer: Self-pay

## 2015-10-18 DIAGNOSIS — I10 Essential (primary) hypertension: Secondary | ICD-10-CM

## 2015-10-18 NOTE — Telephone Encounter (Signed)
Dr. Venia Minks i printed a lab order for cbc and met c. Do you need anything else added? sd

## 2015-10-18 NOTE — Telephone Encounter (Signed)
Ok to print out. Thanks.

## 2015-10-18 NOTE — Telephone Encounter (Signed)
Pt advised to pick up lab order at front office. sd

## 2015-10-18 NOTE — Telephone Encounter (Signed)
Patient needs lab slip to recheck her levels per Dr. Elease HashimotoMaloney patient states-aa

## 2015-10-22 LAB — COMPREHENSIVE METABOLIC PANEL
A/G RATIO: 1.6 (ref 1.1–2.5)
ALK PHOS: 92 IU/L (ref 39–117)
ALT: 16 IU/L (ref 0–32)
AST: 14 IU/L (ref 0–40)
Albumin: 4.4 g/dL (ref 3.5–5.5)
BUN / CREAT RATIO: 16 (ref 9–23)
BUN: 12 mg/dL (ref 6–24)
Bilirubin Total: 0.9 mg/dL (ref 0.0–1.2)
CO2: 21 mmol/L (ref 18–29)
Calcium: 9.1 mg/dL (ref 8.7–10.2)
Chloride: 100 mmol/L (ref 96–106)
Creatinine, Ser: 0.75 mg/dL (ref 0.57–1.00)
GFR calc Af Amer: 107 mL/min/{1.73_m2} (ref >59)
GFR, EST NON AFRICAN AMERICAN: 93 mL/min/{1.73_m2} (ref >59)
GLOBULIN, TOTAL: 2.7 g/dL (ref 1.5–4.5)
Glucose: 80 mg/dL (ref 65–99)
Potassium: 4.3 mmol/L (ref 3.5–5.2)
SODIUM: 140 mmol/L (ref 134–144)
Total Protein: 7.1 g/dL (ref 6.0–8.5)

## 2015-10-22 LAB — CBC WITH DIFFERENTIAL/PLATELET
BASOS: 0 %
Basophils Absolute: 0 10*3/uL (ref 0.0–0.2)
EOS (ABSOLUTE): 0.2 10*3/uL (ref 0.0–0.4)
EOS: 4 %
HEMATOCRIT: 36.8 % (ref 34.0–46.6)
Hemoglobin: 12.4 g/dL (ref 11.1–15.9)
Immature Grans (Abs): 0 10*3/uL (ref 0.0–0.1)
Immature Granulocytes: 0 %
LYMPHS ABS: 2 10*3/uL (ref 0.7–3.1)
Lymphs: 32 %
MCH: 31.6 pg (ref 26.6–33.0)
MCHC: 33.7 g/dL (ref 31.5–35.7)
MCV: 94 fL (ref 79–97)
MONOCYTES: 10 %
MONOS ABS: 0.7 10*3/uL (ref 0.1–0.9)
NEUTROS ABS: 3.4 10*3/uL (ref 1.4–7.0)
Neutrophils: 54 %
Platelets: 267 10*3/uL (ref 150–379)
RBC: 3.92 x10E6/uL (ref 3.77–5.28)
RDW: 12.8 % (ref 12.3–15.4)
WBC: 6.2 10*3/uL (ref 3.4–10.8)

## 2015-10-25 ENCOUNTER — Telehealth: Payer: Self-pay

## 2015-10-25 ENCOUNTER — Other Ambulatory Visit: Payer: Self-pay | Admitting: Family Medicine

## 2015-10-25 DIAGNOSIS — Z1231 Encounter for screening mammogram for malignant neoplasm of breast: Secondary | ICD-10-CM

## 2015-10-25 NOTE — Telephone Encounter (Signed)
-----   Message from Lorie PhenixNancy Maloney, MD sent at 10/22/2015  7:52 AM EST ----- Labs stable. Please notify patient. Thanks.

## 2015-10-25 NOTE — Telephone Encounter (Signed)
Advised pt of lab results. Pt verbally acknowledges understanding. Cydni Reddoch Drozdowski, CMA   

## 2015-10-25 NOTE — Telephone Encounter (Signed)
LMTCB Thaddeus Evitts Drozdowski, CMA  

## 2015-10-28 ENCOUNTER — Ambulatory Visit
Admission: RE | Admit: 2015-10-28 | Discharge: 2015-10-28 | Disposition: A | Discharge status: Home / Self Care | Payer: 59 | Source: Ambulatory Visit | Attending: Family Medicine | Admitting: Family Medicine

## 2015-10-28 DIAGNOSIS — Z1231 Encounter for screening mammogram for malignant neoplasm of breast: Secondary | ICD-10-CM | POA: Diagnosis not present

## 2015-11-09 ENCOUNTER — Telehealth: Payer: Self-pay | Admitting: Family Medicine

## 2015-11-09 ENCOUNTER — Encounter: Payer: Self-pay | Admitting: Physician Assistant

## 2015-11-09 ENCOUNTER — Ambulatory Visit (INDEPENDENT_AMBULATORY_CARE_PROVIDER_SITE_OTHER): Payer: 59 | Admitting: Physician Assistant

## 2015-11-09 VITALS — BP 128/90 | HR 79 | Temp 98.2°F | Resp 16 | Wt 233.8 lb

## 2015-11-09 DIAGNOSIS — S39012A Strain of muscle, fascia and tendon of lower back, initial encounter: Secondary | ICD-10-CM | POA: Diagnosis not present

## 2015-11-09 DIAGNOSIS — M5440 Lumbago with sciatica, unspecified side: Secondary | ICD-10-CM | POA: Diagnosis not present

## 2015-11-09 DIAGNOSIS — M545 Low back pain, unspecified: Secondary | ICD-10-CM

## 2015-11-09 MED ORDER — CYCLOBENZAPRINE HCL 5 MG PO TABS: 5.0000 mg | ORAL_TABLET | Freq: Every day | ORAL | Status: DC

## 2015-11-09 MED ORDER — IBUPROFEN 800 MG PO TABS: 800.0000 mg | ORAL_TABLET | Freq: Three times a day (TID) | ORAL | Status: DC | PRN

## 2015-11-09 NOTE — Telephone Encounter (Signed)
I can see her another day. Stop HCTZ and Antony Contras can check some labs today if needed. Thanks

## 2015-11-09 NOTE — Progress Notes (Signed)
Patient: Ashley Ballard Female    DOB: 07/15/1966   50 y.o.   MRN: [id number] Visit Date: 11/09/2015  Today's Provider: Margaretann Loveless, PA-C   Chief Complaint  Patient presents with  . Back Pain   Subjective:    Back Pain This is a new problem. The current episode started in the past 7 days. The problem has been gradually improving since onset. Pain location: Right side back pain. The quality of the pain is described as aching. The pain does not radiate. The pain is at a severity of 2/10 (Saturday was like 10). The symptoms are aggravated by sitting (sitting for a long period of time). Stiffness is present in the morning. Pertinent negatives include no leg pain, numbness, tingling or weakness. She has tried muscle relaxant, heat, ice and walking for the symptoms. The treatment provided moderate relief.  Patient also states has not been taken the Hydrochlorothiazide for the last two days it was causing her to have tingling on her hands and feet.    No Known Allergies Previous Medications   CALCIUM CARBONATE (OS-CAL) 600 MG TABS TABLET    Take 1 tablet by mouth daily.   CHOLECALCIFEROL (VITAMIN D) 1000 UNITS TABLET    Take 5 tablets by mouth daily.   CYCLOBENZAPRINE (FLEXERIL) 5 MG TABLET    1 tablet daily as needed.   DEXLANSOPRAZOLE (DEXILANT) 60 MG CAPSULE    Take 1 capsule by mouth daily.   FLUTICASONE (FLONASE) 50 MCG/ACT NASAL SPRAY    Place 2 sprays into both nostrils daily.   HYDROCHLOROTHIAZIDE (HYDRODIURIL) 12.5 MG TABLET    Take 1 tablet (12.5 mg total) by mouth daily.   IBUPROFEN (ADVIL,MOTRIN) 800 MG TABLET    Take 1 tablet (800 mg total) by mouth every 8 (eight) hours as needed.   LORATADINE (CLARITIN REDITABS) 10 MG DISSOLVABLE TABLET    Take 1 tablet by mouth daily.   MONTELUKAST (SINGULAIR) 10 MG TABLET    Take 1 tablet (10 mg total) by mouth at bedtime.    Review of Systems  Respiratory: Negative.   Cardiovascular: Negative.   Gastrointestinal: Negative.    Musculoskeletal: Positive for back pain. Negative for arthralgias and gait problem.  Neurological: Positive for dizziness. Negative for tingling, weakness and numbness.    Social History  Substance Use Topics  . Smoking status: Never Smoker   . Smokeless tobacco: Never Used  . Alcohol Use: No   Objective:   BP 128/90 mmHg  Pulse 79  Temp(Src) 98.2 F (36.8 C) (Oral)  Resp 16  Wt 233 lb 12.8 oz (106.051 kg)  LMP 10/21/2015  Physical Exam  Constitutional: She appears well-developed and well-nourished. No distress.  Neck: Normal range of motion. Neck supple.  Cardiovascular: Normal rate, regular rhythm and normal heart sounds.  Exam reveals no gallop and no friction rub.   No murmur heard. Pulmonary/Chest: Effort normal and breath sounds normal. No respiratory distress. She has no wheezes. She has no rales.  Musculoskeletal:       Lumbar back: She exhibits tenderness and pain. She exhibits normal range of motion, no bony tenderness, no spasm and normal pulse.  Tenderness in muscles in lumbar region on right side. No radiating symptoms. No loss of motion. No weakness. Neg SLR.  Skin: She is not diaphoretic.  Vitals reviewed.     Assessment & Plan:     1. Right-sided low back pain without sciatica I do feel it is a muscle  strain.  She states it has been drastically improving since initial injury on Saturday. She has been taking flexeril and IBU for the pain. I will refill these medications as below. I also advised for her to use heating pad over the area for 15-20 minutes 3 times daily. I also advised she could use epsom salt soaks for inflammation also.  She is to call the office if symptoms worsen or do not improve. - cyclobenzaprine (FLEXERIL) 5 MG tablet; Take 1 tablet (5 mg total) by mouth at bedtime.  Dispense: 30 tablet; Refill: 0  2. Strain of lumbar paraspinal muscle, initial encounter See above medical treatment.  3. Low back pain with sciatica, sciatica laterality  unspecified, unspecified back pain laterality See above medical treatment. - ibuprofen (ADVIL,MOTRIN) 800 MG tablet; Take 1 tablet (800 mg total) by mouth every 8 (eight) hours as needed.  Dispense: 90 tablet; Refill: 0       Margaretann Loveless, PA-C  [facility]

## 2015-11-09 NOTE — Patient Instructions (Signed)

## 2015-11-09 NOTE — Telephone Encounter (Signed)
Pt stated that she thinks that the hydrochlorothiazide (HYDRODIURIL) 12.5 MG tablet is causing her hands and feet to tingle off and on. Pt is coming in to see Jenni at 3:30 for back pain. Pt wanted to see Dr. Elease Hashimoto but she has an appt and couldn't come to when Dr. Elease Hashimoto had an open appt. Please advise. Thanks TNP

## 2015-12-26 ENCOUNTER — Ambulatory Visit: Payer: 59 | Admitting: Family Medicine

## 2015-12-30 ENCOUNTER — Ambulatory Visit (INDEPENDENT_AMBULATORY_CARE_PROVIDER_SITE_OTHER): Payer: 59 | Admitting: Family Medicine

## 2015-12-30 ENCOUNTER — Encounter: Payer: Self-pay | Admitting: Family Medicine

## 2015-12-30 VITALS — BP 124/88 | HR 88 | Temp 98.0°F | Resp 16 | Wt 226.0 lb

## 2015-12-30 DIAGNOSIS — E669 Obesity, unspecified: Secondary | ICD-10-CM

## 2015-12-30 NOTE — Progress Notes (Signed)
Subjective:    Patient ID: Ashley NavyFelicia B Ballard, female    DOB: 04-28-1966, 50 y.o.   MRN: [id number]  HPI   Follow up for Hypertension  The patient was last seen for this 3 months ago. BP at LOV was 146/98. Changes made at last visit include start HCTZ 12.5 mg. Labs checked 10/25/2015 and were stable.  She reports poor compliance with treatment. Pt had to D/C HCTZ due to paresthesias. She feels that condition is Improved. Average BP readings at home have ranges from 106-127 /71-97.   ------------------------------------------------------------------------------------   Follow up for Adiposity  The patient was last seen for this 3 months ago. Weight at LOV was 234 lbs. Changes made at last visit include trying weight watchers.  She reports excellent compliance with treatment. She feels that condition is Improved. Pt has lost 8 pounds since LOV.   ------------------------------------------------------------------------------------     Review of Systems  Constitutional: Negative for fever, chills, diaphoresis, activity change, appetite change and fatigue.  Eyes: Negative for visual disturbance.  Respiratory: Negative for cough, shortness of breath and wheezing.   Cardiovascular: Negative for chest pain, palpitations and leg swelling.  Neurological: Negative for headaches.   BP 124/88 mmHg  Pulse 88  Temp(Src) 98 F (36.7 C) (Oral)  Resp 16  Wt 226 lb (102.513 kg)  LMP 11/19/2015   Patient Active Problem List   Diagnosis Date Noted  . Hypertension 09/27/2015  . Chronic sinusitis 05/02/2015  . Abnormal ECG 04/07/2015  . 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 Acid reflux 04/07/2015  . Low back pain with sciatica 04/07/2015  . Adiposity 04/07/2015  . Hemorrhage, postmenopausal 04/07/2015  . Pain 04/07/2015  . Avitaminosis D 04/07/2015   Past Medical History  Diagnosis Date  . GERD (gastroesophageal reflux disease)    Current Outpatient Prescriptions on File Prior to  Visit  Medication Sig  . calcium carbonate (OS-CAL) 600 MG TABS tablet Take 1 tablet by mouth daily.  . cholecalciferol (VITAMIN D) 1000 UNITS tablet Take 5 tablets by mouth daily.  . cyclobenzaprine (FLEXERIL) 5 MG tablet Take 1 tablet (5 mg total) by mouth at bedtime.  . fluticasone (FLONASE) 50 MCG/ACT nasal spray Place 2 sprays into both nostrils daily.  Marland Kitchen. ibuprofen (ADVIL,MOTRIN) 800 MG tablet Take 1 tablet (800 mg total) by mouth every 8 (eight) hours as needed.  . loratadine (CLARITIN REDITABS) 10 MG dissolvable tablet Take 1 tablet by mouth daily.  Marland Kitchen. dexlansoprazole (DEXILANT) 60 MG capsule Take 1 capsule by mouth daily. Reported on 12/30/2015  . montelukast (SINGULAIR) 10 MG tablet Take 1 tablet (10 mg total) by mouth at bedtime. (Patient not taking: Reported on 12/30/2015)   No current facility-administered medications on file prior to visit.   No Known Allergies Past Surgical History  Procedure Laterality Date  . Cholecystectomy    . Tubal ligation    . Biopsy endometrial  01/21/2014  . Breast cyst excision Right 1983   Social History   Social History  . Marital Status: Married    Spouse Name: Casimiro NeedleMichael  . Number of Children: 2  . Years of Education: N/A   Occupational History  . full time    Social History Main Topics  . Smoking status: Never Smoker   . Smokeless tobacco: Never Used  . Alcohol Use: No  . Drug Use: No  . Sexual Activity: Not on file   Other Topics Concern  . Not on file   Social History Narrative   Family History  Problem  Relation Age of Onset  . Hypertension Mother   . Diabetes Mother   . Breast cancer Mother 38  . Heart attack Paternal Uncle   . Kidney cancer Maternal Grandfather   . Pneumonia Paternal Grandmother   . Leukemia Paternal Grandfather   . Breast cancer Maternal Grandmother 85      Objective:   Physical Exam  Constitutional: She appears well-developed and well-nourished.  Cardiovascular: Normal rate and regular rhythm.     Pulmonary/Chest: Effort normal and breath sounds normal.  Psychiatric: She has a normal mood and affect. Her behavior is normal.  BP 124/88 mmHg  Pulse 88  Temp(Src) 98 F (36.7 C) (Oral)  Resp 16  Wt 226 lb (102.513 kg)  LMP 11/19/2015     Assessment & Plan:  1. Adiposity Greatly improved. Hypertension resolved with weight loss. Continue weight loss and exercise. Continue Weight Watchers.  Talked over 15 minutes about healthy lifestyle and lifestyle changes she has made and will continue to make over next few months. Recheck ov in 3 months.    Spent over 15 minutes in direct patient care.    Patient was seen and examined by Leo Grosser, MD, and note scribed by Allene Dillon, CMA. I have reviewed the document for accuracy and completeness and I agree with above. Leo Grosser, MD   Lorie Phenix, MD

## 2016-01-03 ENCOUNTER — Other Ambulatory Visit: Payer: Self-pay | Admitting: Physician Assistant

## 2016-01-06 ENCOUNTER — Encounter: Payer: Self-pay | Admitting: Physician Assistant

## 2016-01-06 ENCOUNTER — Telehealth: Payer: Self-pay | Admitting: Physician Assistant

## 2016-01-06 ENCOUNTER — Ambulatory Visit (INDEPENDENT_AMBULATORY_CARE_PROVIDER_SITE_OTHER): Payer: 59 | Admitting: Physician Assistant

## 2016-01-06 VITALS — BP 138/94 | HR 112 | Temp 98.5°F | Resp 18 | Wt 224.0 lb

## 2016-01-06 DIAGNOSIS — I10 Essential (primary) hypertension: Secondary | ICD-10-CM

## 2016-01-06 MED ORDER — LISINOPRIL 5 MG PO TABS: 5.0000 mg | ORAL_TABLET | Freq: Every day | ORAL | Status: DC

## 2016-01-06 NOTE — Telephone Encounter (Signed)
Pt was calling to get an RX but made an appt with ParmaJenni instead. Thanks TNP

## 2016-01-06 NOTE — Patient Instructions (Signed)
Hypertension Hypertension, commonly called high blood pressure, is when the force of blood pumping through your arteries is too strong. Your arteries are the blood vessels that carry blood from your heart throughout your body. A blood pressure reading consists of a higher number over a lower number, such as 110/72. The higher number (systolic) is the pressure inside your arteries when your heart pumps. The lower number (diastolic) is the pressure inside your arteries when your heart relaxes. Ideally you want your blood pressure below 120/80. Hypertension forces your heart to work harder to pump blood. Your arteries may become narrow or stiff. Having untreated or uncontrolled hypertension can cause heart attack, stroke, kidney disease, and other problems. RISK FACTORS Some risk factors for high blood pressure are controllable. Others are not.  Risk factors you cannot control include:   Race. You may be at higher risk if you are African American.  Age. Risk increases with age.  Gender. Men are at higher risk than women before age 45 years. After age 65, women are at higher risk than men. Risk factors you can control include:  Not getting enough exercise or physical activity.  Being overweight.  Getting too much fat, sugar, calories, or salt in your diet.  Drinking too much alcohol. SIGNS AND SYMPTOMS Hypertension does not usually cause signs or symptoms. Extremely high blood pressure (hypertensive crisis) may cause headache, anxiety, shortness of breath, and nosebleed. DIAGNOSIS To check if you have hypertension, your health care provider will measure your blood pressure while you are seated, with your arm held at the level of your heart. It should be measured at least twice using the same arm. Certain conditions can cause a difference in blood pressure between your right and left arms. A blood pressure reading that is higher than normal on one occasion does not mean that you need treatment. If  it is not clear whether you have high blood pressure, you may be asked to return on a different day to have your blood pressure checked again. Or, you may be asked to monitor your blood pressure at home for 1 or more weeks. TREATMENT Treating high blood pressure includes making lifestyle changes and possibly taking medicine. Living a healthy lifestyle can help lower high blood pressure. You may need to change some of your habits. Lifestyle changes may include:  Following the DASH diet. This diet is high in fruits, vegetables, and whole grains. It is low in salt, red meat, and added sugars.  Keep your sodium intake below 2,300 mg per day.  Getting at least 30-45 minutes of aerobic exercise at least 4 times per week.  Losing weight if necessary.  Not smoking.  Limiting alcoholic beverages.  Learning ways to reduce stress. Your health care provider may prescribe medicine if lifestyle changes are not enough to get your blood pressure under control, and if one of the following is true:  You are 18-59 years of age and your systolic blood pressure is above 140.  You are 60 years of age or older, and your systolic blood pressure is above 150.  Your diastolic blood pressure is above 90.  You have diabetes, and your systolic blood pressure is over 140 or your diastolic blood pressure is over 90.  You have kidney disease and your blood pressure is above 140/90.  You have heart disease and your blood pressure is above 140/90. Your personal target blood pressure may vary depending on your medical conditions, your age, and other factors. HOME CARE INSTRUCTIONS    Have your blood pressure rechecked as directed by your health care provider.   Take medicines only as directed by your health care provider. Follow the directions carefully. Blood pressure medicines must be taken as prescribed. The medicine does not work as well when you skip doses. Skipping doses also puts you at risk for  problems.  Do not smoke.   Monitor your blood pressure at home as directed by your health care provider. SEEK MEDICAL CARE IF:   You think you are having a reaction to medicines taken.  You have recurrent headaches or feel dizzy.  You have swelling in your ankles.  You have trouble with your vision. SEEK IMMEDIATE MEDICAL CARE IF:  You develop a severe headache or confusion.  You have unusual weakness, numbness, or feel faint.  You have severe chest or abdominal pain.  You vomit repeatedly.  You have trouble breathing. MAKE SURE YOU:   Understand these instructions.  Will watch your condition.  Will get help right away if you are not doing well or get worse.   This information is not intended to replace advice given to you by your health care provider. Make sure you discuss any questions you have with your health care provider.   Document Released: 10/08/2005 Document Revised: 02/22/2015 Document Reviewed: 07/31/2013 Elsevier Interactive Patient Education 2016 Elsevier Inc. DASH Eating Plan DASH stands for "Dietary Approaches to Stop Hypertension." The DASH eating plan is a healthy eating plan that has been shown to reduce high blood pressure (hypertension). Additional health benefits may include reducing the risk of type 2 diabetes mellitus, heart disease, and stroke. The DASH eating plan may also help with weight loss. WHAT DO I NEED TO KNOW ABOUT THE DASH EATING PLAN? For the DASH eating plan, you will follow these general guidelines:  Choose foods with a percent daily value for sodium of less than 5% (as listed on the food label).  Use salt-free seasonings or herbs instead of table salt or sea salt.  Check with your health care provider or pharmacist before using salt substitutes.  Eat lower-sodium products, often labeled as "lower sodium" or "no salt added."  Eat fresh foods.  Eat more vegetables, fruits, and low-fat dairy products.  Choose whole grains.  Look for the word "whole" as the first word in the ingredient list.  Choose fish and skinless chicken or turkey more often than red meat. Limit fish, poultry, and meat to 6 oz (170 g) each day.  Limit sweets, desserts, sugars, and sugary drinks.  Choose heart-healthy fats.  Limit cheese to 1 oz (28 g) per day.  Eat more home-cooked food and less restaurant, buffet, and fast food.  Limit fried foods.  Cook foods using methods other than frying.  Limit canned vegetables. If you do use them, rinse them well to decrease the sodium.  When eating at a restaurant, ask that your food be prepared with less salt, or no salt if possible. WHAT FOODS CAN I EAT? Seek help from a dietitian for individual calorie needs. Grains Whole grain or whole wheat bread. Brown rice. Whole grain or whole wheat pasta. Quinoa, bulgur, and whole grain cereals. Low-sodium cereals. Corn or whole wheat flour tortillas. Whole grain cornbread. Whole grain crackers. Low-sodium crackers. Vegetables Fresh or frozen vegetables (raw, steamed, roasted, or grilled). Low-sodium or reduced-sodium tomato and vegetable juices. Low-sodium or reduced-sodium tomato sauce and paste. Low-sodium or reduced-sodium canned vegetables.  Fruits All fresh, canned (in natural juice), or frozen fruits. Meat and Other   Protein Products Ground beef (85% or leaner), grass-fed beef, or beef trimmed of fat. Skinless chicken or turkey. Ground chicken or turkey. Pork trimmed of fat. All fish and seafood. Eggs. Dried beans, peas, or lentils. Unsalted nuts and seeds. Unsalted canned beans. Dairy Low-fat dairy products, such as skim or 1% milk, 2% or reduced-fat cheeses, low-fat ricotta or cottage cheese, or plain low-fat yogurt. Low-sodium or reduced-sodium cheeses. Fats and Oils Tub margarines without trans fats. Light or reduced-fat mayonnaise and salad dressings (reduced sodium). Avocado. Safflower, olive, or canola oils. Natural peanut or almond  butter. Other Unsalted popcorn and pretzels. The items listed above may not be a complete list of recommended foods or beverages. Contact your dietitian for more options. WHAT FOODS ARE NOT RECOMMENDED? Grains White bread. White pasta. White rice. Refined cornbread. Bagels and croissants. Crackers that contain trans fat. Vegetables Creamed or fried vegetables. Vegetables in a cheese sauce. Regular canned vegetables. Regular canned tomato sauce and paste. Regular tomato and vegetable juices. Fruits Dried fruits. Canned fruit in light or heavy syrup. Fruit juice. Meat and Other Protein Products Fatty cuts of meat. Ribs, chicken wings, bacon, sausage, bologna, salami, chitterlings, fatback, hot dogs, bratwurst, and packaged luncheon meats. Salted nuts and seeds. Canned beans with salt. Dairy Whole or 2% milk, cream, half-and-half, and cream cheese. Whole-fat or sweetened yogurt. Full-fat cheeses or blue cheese. Nondairy creamers and whipped toppings. Processed cheese, cheese spreads, or cheese curds. Condiments Onion and garlic salt, seasoned salt, table salt, and sea salt. Canned and packaged gravies. Worcestershire sauce. Tartar sauce. Barbecue sauce. Teriyaki sauce. Soy sauce, including reduced sodium. Steak sauce. Fish sauce. Oyster sauce. Cocktail sauce. Horseradish. Ketchup and mustard. Meat flavorings and tenderizers. Bouillon cubes. Hot sauce. Tabasco sauce. Marinades. Taco seasonings. Relishes. Fats and Oils Butter, stick margarine, lard, shortening, ghee, and bacon fat. Coconut, palm kernel, or palm oils. Regular salad dressings. Other Pickles and olives. Salted popcorn and pretzels. The items listed above may not be a complete list of foods and beverages to avoid. Contact your dietitian for more information. WHERE CAN I FIND MORE INFORMATION? National Heart, Lung, and [employer] Institute: www.nhlbi.nih.gov/health/health-topics/topics/dash/   This information is not intended to replace  advice given to you by your health care provider. Make sure you discuss any questions you have with your health care provider.   Document Released: 09/27/2011 Document Revised: 10/29/2014 Document Reviewed: 08/12/2013 Elsevier Interactive Patient Education 2016 Elsevier Inc.  

## 2016-01-06 NOTE — Progress Notes (Signed)
Patient ID: Ashley NavyFelicia B Dimon, female   DOB: 1966/03/24, 50 y.o.   MRN: [id number]     Patient: Ashley Ballard Female    DOB: 1966/03/24   50 y.o.   MRN: [id number] Visit Date: 01/06/2016  Today's Provider: Margaretann LovelessJennifer M Hanin Decook, PA-C   Chief Complaint  Patient presents with  . Hypertension   Subjective:    HPI Pt is here for blood pressure follow up. She has seen Dr. Elease HashimotoMaloney for her blood pressure and she had started her on HCTZ, it gave her tingling in her hands and feet. Her and Dr. Elease HashimotoMaloney discussed since pt is exercising and losing weight that they were going to not take any medications. But her blood pressure has been running 130's/90's the last few days and she would rather go ahead and start another medication instead of it running the way it has been.     No Known Allergies Previous Medications   CALCIUM CARBONATE (OS-CAL) 600 MG TABS TABLET    Take 1 tablet by mouth daily.   CHOLECALCIFEROL (VITAMIN D) 1000 UNITS TABLET    Take 5 tablets by mouth daily.   CYCLOBENZAPRINE (FLEXERIL) 5 MG TABLET    TAKE 1 TABLET (5 MG TOTAL) BY MOUTH AT BEDTIME.   DEXLANSOPRAZOLE (DEXILANT) 60 MG CAPSULE    Take 1 capsule by mouth daily. Reported on 12/30/2015   FLUTICASONE (FLONASE) 50 MCG/ACT NASAL SPRAY    Place 2 sprays into both nostrils daily.   IBUPROFEN (ADVIL,MOTRIN) 800 MG TABLET    Take 1 tablet (800 mg total) by mouth every 8 (eight) hours as needed.   LORATADINE (CLARITIN REDITABS) 10 MG DISSOLVABLE TABLET    Take 1 tablet by mouth daily.   MONTELUKAST (SINGULAIR) 10 MG TABLET    Take 1 tablet (10 mg total) by mouth at bedtime.    Review of Systems  Constitutional: Negative.   Eyes: Negative.   Respiratory: Negative.   Cardiovascular: Negative.   Gastrointestinal: Negative.   Endocrine: Negative.   Genitourinary: Negative.   Musculoskeletal: Negative.   Skin: Negative.   Allergic/Immunologic: Negative.   Neurological: Positive for headaches (thinks it is sinuses ).    Hematological: Negative.   Psychiatric/Behavioral: Negative.     Social History  Substance Use Topics  . Smoking status: Never Smoker   . Smokeless tobacco: Never Used  . Alcohol Use: No   Objective:   BP 138/94 mmHg  Pulse 112  Temp(Src) 98.5 F (36.9 C) (Oral)  Resp 18  Wt 224 lb (101.606 kg)  LMP 11/19/2015  Physical Exam  Constitutional: She appears well-developed and well-nourished. No distress.  Cardiovascular: Normal rate, regular rhythm and normal heart sounds.  Exam reveals no gallop and no friction rub.   No murmur heard. Pulmonary/Chest: Effort normal and breath sounds normal. No respiratory distress. She has no wheezes. She has no rales.  Musculoskeletal: She exhibits edema (very mild edema).  Skin: She is not diaphoretic.  Vitals reviewed.       Assessment & Plan:     1. Essential hypertension Will add lisinopril 5mg  to help with BP control. She does check her BP regularly at home approx 3 times per week. She will call if her BP continues to run high for increase in dose. She will also call if she has any adverse reactions to this medication.  If so may consider switching to a CCB for better control and tolerance.  She does not want to come back in for a 4 week  f/u but will call the office if needed. She already has a scheduled visit with Dr. Elease Hashimoto in the beginning of June where she can be seen for BP recheck if stable. - lisinopril (PRINIVIL,ZESTRIL) 5 MG tablet; Take 1 tablet (5 mg total) by mouth daily.  Dispense: 90 tablet; Refill: 1       Margaretann Loveless, PA-C  [facility]

## 2016-01-10 ENCOUNTER — Telehealth: Payer: Self-pay

## 2016-01-10 NOTE — Telephone Encounter (Signed)
Patient reports a red rash on breast area. Patient states she noticed the rash this morning, but denies itching. Patient started taking Lisinopril on Friday. Patient was concerned the rash may be a reaction to Lisinopril. Patient wanted to know if she could take Benadryl. Discussed with Maurine Ministerennis. Per Maurine Ministerennis advised patient that Benadryl may not help rash since she is not itching. Advised patient if rash is still present tomorrow she needs to follow up in office. Patient verbalized understanding.

## 2016-01-11 NOTE — Telephone Encounter (Signed)
Agreed.  Thank you

## 2016-01-17 ENCOUNTER — Emergency Department
Admission: EM | Admit: 2016-01-17 | Discharge: 2016-01-17 | Disposition: A | Discharge status: Home / Self Care | Payer: 59 | Attending: Emergency Medicine | Admitting: Emergency Medicine

## 2016-01-17 ENCOUNTER — Emergency Department: Payer: 59

## 2016-01-17 ENCOUNTER — Encounter: Payer: Self-pay | Admitting: Emergency Medicine

## 2016-01-17 DIAGNOSIS — Z7951 Long term (current) use of inhaled steroids: Secondary | ICD-10-CM | POA: Diagnosis not present

## 2016-01-17 DIAGNOSIS — Z79899 Other long term (current) drug therapy: Secondary | ICD-10-CM | POA: Insufficient documentation

## 2016-01-17 DIAGNOSIS — R109 Unspecified abdominal pain: Secondary | ICD-10-CM | POA: Diagnosis present

## 2016-01-17 DIAGNOSIS — Z5321 Procedure and treatment not carried out due to patient leaving prior to being seen by health care provider: Secondary | ICD-10-CM | POA: Diagnosis not present

## 2016-01-17 HISTORY — DX: Essential (primary) hypertension: I10

## 2016-01-17 LAB — BASIC METABOLIC PANEL
Anion gap: 9 (ref 5–15)
BUN: 13 mg/dL (ref 6–20)
CALCIUM: 9.1 mg/dL (ref 8.9–10.3)
CHLORIDE: 106 mmol/L (ref 101–111)
CO2: 20 mmol/L — ABNORMAL LOW (ref 22–32)
CREATININE: 0.73 mg/dL (ref 0.44–1.00)
GFR calc non Af Amer: >60 mL/min (ref >60)
Glucose, Bld: 96 mg/dL (ref 65–99)
Potassium: 3.9 mmol/L (ref 3.5–5.1)
SODIUM: 135 mmol/L (ref 135–145)

## 2016-01-17 LAB — CBC
HCT: 37.2 % (ref 35.0–47.0)
Hemoglobin: 12.8 g/dL (ref 12.0–16.0)
MCH: 32.2 pg (ref 26.0–34.0)
MCHC: 34.4 g/dL (ref 32.0–36.0)
MCV: 93.6 fL (ref 80.0–100.0)
PLATELETS: 292 10*3/uL (ref 150–440)
RBC: 3.97 MIL/uL (ref 3.80–5.20)
RDW: 12.6 % (ref 11.5–14.5)
WBC: 7.4 10*3/uL (ref 3.6–11.0)

## 2016-01-17 LAB — LIPASE, BLOOD: Lipase: 20 U/L (ref 11–51)

## 2016-01-17 LAB — TROPONIN I

## 2016-01-17 NOTE — ED Notes (Signed)
Patient presents to the ED with epigastric pain that woke her up out of sleep at 4am this morning.  Patient states, "It feels like something is stuck in my chest."  Patient reports history of acid reflux and states, "that always feels like a burning pain but this isn't like that."  Patient denies shortness of breath, dizziness, and nausea.  Patient denies pain radiating anywhere. Patient reports a history of htn.  Denies any other medical problems.

## 2016-03-09 ENCOUNTER — Ambulatory Visit: Payer: 59 | Admitting: Family Medicine

## 2016-03-30 ENCOUNTER — Ambulatory Visit (INDEPENDENT_AMBULATORY_CARE_PROVIDER_SITE_OTHER): Payer: 59 | Admitting: Family Medicine

## 2016-03-30 ENCOUNTER — Encounter: Payer: Self-pay | Admitting: Family Medicine

## 2016-03-30 VITALS — BP 122/84 | HR 80 | Temp 98.3°F | Resp 16 | Wt 219.0 lb

## 2016-03-30 DIAGNOSIS — Z13220 Encounter for screening for lipoid disorders: Secondary | ICD-10-CM

## 2016-03-30 DIAGNOSIS — R202 Paresthesia of skin: Secondary | ICD-10-CM

## 2016-03-30 DIAGNOSIS — J328 Other chronic sinusitis: Secondary | ICD-10-CM

## 2016-03-30 DIAGNOSIS — E559 Vitamin D deficiency, unspecified: Secondary | ICD-10-CM

## 2016-03-30 DIAGNOSIS — M5440 Lumbago with sciatica, unspecified side: Secondary | ICD-10-CM | POA: Diagnosis not present

## 2016-03-30 DIAGNOSIS — I10 Essential (primary) hypertension: Secondary | ICD-10-CM | POA: Diagnosis not present

## 2016-03-30 MED ORDER — IBUPROFEN 800 MG PO TABS: 800.0000 mg | ORAL_TABLET | Freq: Three times a day (TID) | ORAL | Status: DC | PRN

## 2016-03-30 MED ORDER — HYDROCHLOROTHIAZIDE 12.5 MG PO TABS: 12.5000 mg | ORAL_TABLET | Freq: Every day | ORAL | Status: DC

## 2016-03-30 MED ORDER — CYCLOBENZAPRINE HCL 5 MG PO TABS: 5.0000 mg | ORAL_TABLET | Freq: Every day | ORAL | Status: DC

## 2016-03-30 MED ORDER — FLUTICASONE PROPIONATE 50 MCG/ACT NA SUSP: 2.0000 | Freq: Every day | NASAL | Status: DC

## 2016-03-30 NOTE — Progress Notes (Addendum)
Subjective:    Patient ID: Ashley NavyFelicia B Moll, female    DOB: 1966-05-09, 50 y.o.   MRN: [id number]  HPI   Hypertension, follow-up:  BP Readings from Last 3 Encounters:  01/17/16 152/91  01/06/16 138/94  12/30/15 124/88    She was last seen for hypertension by Antony ContrasJenni 3 months ago.  BP at that visit was 138/94. Management since that visit includes adding Lisinopril 5 mg. She reports fair compliance with treatment. Pt reports the Lisinopril caused "mouth itching". Has since restarted HCTZ 12.5 mg. Needs refills on this. She is exercising 5 days a week x 1 hour. She is adherent to low salt diet.   Outside blood pressures are fluctuating, typically are 117-125/75-84.  She is experiencing none.  Patient denies chest pain, claudication, dyspnea, exertional chest pressure/discomfort, fatigue, irregular heart beat, lower extremity edema, near-syncope, orthopnea, palpitations and syncope.   Cardiovascular risk factors include hypertension.    Weight trend: stable Wt Readings from Last 3 Encounters:  01/17/16 224 lb (101.606 kg)  01/06/16 224 lb (101.606 kg)  12/30/15 226 lb (102.513 kg)    Current diet: in general, a "healthy" diet    ------------------------------------------------------------------------    Follow up for Back Pain  Pt currently taking Flexeril 5 mg and ibuprofen 800 mg PRN for this problem.  She reports excellent compliance with treatment. She feels that condition is Improved. She is not having side effects.  ------------------------------------------------------------------------------------       Follow up for Allergic Rhinitis  Pt currently is taking Singulair and Flonase for this problem.  She reports excellent compliance with treatment. She feels that condition is Improved.  ------------------------------------------------------------------------------------ Patient Active Problem List   Diagnosis Date Noted  . Essential hypertension  01/06/2016  . Chronic sinusitis 05/02/2015  . Abnormal ECG 04/07/2015  . 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 Acid reflux 04/07/2015  . Low back pain with sciatica 04/07/2015  . Adiposity 04/07/2015  . Hemorrhage, postmenopausal 04/07/2015  . Pain 04/07/2015  . Avitaminosis D 04/07/2015   Past Medical History  Diagnosis Date  . GERD (gastroesophageal reflux disease)   . Hypertension    Current Outpatient Prescriptions on File Prior to Visit  Medication Sig  . calcium carbonate (OS-CAL) 600 MG TABS tablet Take 1 tablet by mouth daily.  . cholecalciferol (VITAMIN D) 1000 UNITS tablet Take 5 tablets by mouth daily.  Marland Kitchen. dexlansoprazole (DEXILANT) 60 MG capsule Take 1 capsule by mouth daily. Reported on 12/30/2015  . loratadine (CLARITIN REDITABS) 10 MG dissolvable tablet Take 1 tablet by mouth daily.  . montelukast (SINGULAIR) 10 MG tablet Take 1 tablet (10 mg total) by mouth at bedtime.   No current facility-administered medications on file prior to visit.   Allergies  Allergen Reactions  . Lisinopril Itching    Mouth Itching   Past Surgical History  Procedure Laterality Date  . Tubal ligation    . Biopsy endometrial  01/21/2014  . Breast cyst excision Right 1983   Social History   Social History  . Marital Status: Married    Spouse Name: Casimiro NeedleMichael  . Number of Children: 2  . Years of Education: N/A   Occupational History  . full time    Social History Main Topics  . Smoking status: Never Smoker   . Smokeless tobacco: Never Used  . Alcohol Use: No  . Drug Use: No  . Sexual Activity: Not on file   Other Topics Concern  . Not on file   Social History Narrative  Family History  Problem Relation Age of Onset  . Hypertension Mother   . Diabetes Mother   . Breast cancer Mother 84  . Heart attack Paternal Uncle   . Kidney cancer Maternal Grandfather   . Pneumonia Paternal Grandmother   . Leukemia Paternal Grandfather   . Breast cancer Maternal Grandmother 30      .result    Review of Systems  Constitutional: Negative for fever, chills, diaphoresis, activity change, appetite change, fatigue and unexpected weight change.  HENT: Negative for postnasal drip, rhinorrhea, sinus pressure, sneezing and sore throat.   Respiratory: Negative for cough, shortness of breath and wheezing.   Cardiovascular: Negative for chest pain, palpitations and leg swelling.       Objective:   Physical Exam  Constitutional: She appears well-developed and well-nourished.  Cardiovascular: Normal rate and regular rhythm.   Pulmonary/Chest: Effort normal and breath sounds normal. No respiratory distress.  Psychiatric: She has a normal mood and affect. Her behavior is normal.   BP 122/84 mmHg  Pulse 80  Temp(Src) 98.3 F (36.8 C) (Oral)  Resp 16  Wt 219 lb (99.338 kg)     Assessment & Plan:  1. Essential hypertension Reaction to Ace inhibitor. Added to allergy list. Doing okay on HCTZ. Was concerned because she experienced paresthesias in the past while on this medication. Labs at that time were WNL. Paresthesias were most likely secondary to sciatica. Restart HCTZ. Check labs. - hydrochlorothiazide (HYDRODIURIL) 12.5 MG tablet; Take 1 tablet (12.5 mg total) by mouth daily.  Dispense: 90 tablet; Refill: 3 - TSH - CBC with Differential/Platelet - Comprehensive metabolic panel  2. Other chronic sinusitis Stable. Refills provided. - fluticasone (FLONASE) 50 MCG/ACT nasal spray; Place 2 sprays into both nostrils daily.  Dispense: 16 g; Refill: 11  3. Low back pain with sciatica, sciatica laterality unspecified, unspecified back pain laterality Stable. Refills provided.  - ibuprofen (ADVIL,MOTRIN) 800 MG tablet; Take 1 tablet (800 mg total) by mouth every 8 (eight) hours as needed.  Dispense: 90 tablet; Refill: 1 - cyclobenzaprine (FLEXERIL) 5 MG tablet; Take 1 tablet (5 mg total) by mouth at bedtime.  Dispense: 30 tablet; Refill: 5  4. Avitaminosis D FU  pending lab results. - VITAMIN D 25 Hydroxy (Vit-D Deficiency, Fractures)  5. Paresthesia Improved. Most likely was sciatica related. Will check labs to rule out deficiency. - Vitamin B12  6. Lipid screening FU pending lab results. - Lipid panel   Patient seen and examined by Leo Grosser, MD, and note scribed by Allene Dillon, CMA.   I have reviewed the document for accuracy and completeness and I agree with above. Leo Grosser, MD   Lorie Phenix, MD

## 2016-03-31 ENCOUNTER — Other Ambulatory Visit: Payer: Self-pay | Admitting: Family Medicine

## 2016-03-31 LAB — COMPREHENSIVE METABOLIC PANEL WITH GFR
ALT: 20 IU/L (ref 0–32)
AST: 19 IU/L (ref 0–40)
Albumin/Globulin Ratio: 1.9 (ref 1.2–2.2)
Albumin: 4.5 g/dL (ref 3.5–5.5)
Alkaline Phosphatase: 72 IU/L (ref 39–117)
BUN/Creatinine Ratio: 19 (ref 9–23)
BUN: 14 mg/dL (ref 6–24)
Bilirubin Total: 0.9 mg/dL (ref 0.0–1.2)
CO2: 23 mmol/L (ref 18–29)
Calcium: 9.5 mg/dL (ref 8.7–10.2)
Chloride: 101 mmol/L (ref 96–106)
Creatinine, Ser: 0.73 mg/dL (ref 0.57–1.00)
GFR calc Af Amer: 110 mL/min/1.73 (ref >59)
GFR calc non Af Amer: 95 mL/min/1.73 (ref >59)
Globulin, Total: 2.4 g/dL (ref 1.5–4.5)
Glucose: 82 mg/dL (ref 65–99)
Potassium: 4.3 mmol/L (ref 3.5–5.2)
Sodium: 141 mmol/L (ref 134–144)
Total Protein: 6.9 g/dL (ref 6.0–8.5)

## 2016-03-31 LAB — CBC WITH DIFFERENTIAL/PLATELET
BASOS ABS: 0 10*3/uL (ref 0.0–0.2)
BASOS: 0 %
EOS (ABSOLUTE): 0.1 10*3/uL (ref 0.0–0.4)
Eos: 2 %
Hematocrit: 36.4 % (ref 34.0–46.6)
Hemoglobin: 12.3 g/dL (ref 11.1–15.9)
Immature Grans (Abs): 0 10*3/uL (ref 0.0–0.1)
Immature Granulocytes: 0 %
LYMPHS ABS: 1.8 10*3/uL (ref 0.7–3.1)
Lymphs: 32 %
MCH: 31.4 pg (ref 26.6–33.0)
MCHC: 33.8 g/dL (ref 31.5–35.7)
MCV: 93 fL (ref 79–97)
MONOS ABS: 0.4 10*3/uL (ref 0.1–0.9)
Monocytes: 7 %
NEUTROS ABS: 3.2 10*3/uL (ref 1.4–7.0)
Neutrophils: 59 %
PLATELETS: 234 10*3/uL (ref 150–379)
RBC: 3.92 x10E6/uL (ref 3.77–5.28)
RDW: 13.2 % (ref 12.3–15.4)
WBC: 5.5 10*3/uL (ref 3.4–10.8)

## 2016-03-31 LAB — VITAMIN B12: Vitamin B-12: 309 pg/mL (ref 211–946)

## 2016-03-31 LAB — LIPID PANEL
CHOL/HDL RATIO: 2.8 ratio (ref 0.0–4.4)
CHOLESTEROL TOTAL: 131 mg/dL (ref 100–199)
HDL: 46 mg/dL (ref >39)
LDL CALC: 73 mg/dL (ref 0–99)
Triglycerides: 60 mg/dL (ref 0–149)
VLDL Cholesterol Cal: 12 mg/dL (ref 5–40)

## 2016-03-31 LAB — TSH: TSH: 1.42 u[IU]/mL (ref 0.450–4.500)

## 2016-03-31 LAB — VITAMIN D 25 HYDROXY (VIT D DEFICIENCY, FRACTURES): Vit D, 25-Hydroxy: 49.6 ng/mL (ref 30.0–100.0)

## 2016-03-31 NOTE — Addendum Note (Signed)
Addended by: Leo GrosserMALONEY, Gracia Saggese J on: 03/31/2016 06:44 AM   Modules accepted: Kipp BroodSmartSet

## 2016-04-02 ENCOUNTER — Telehealth: Payer: Self-pay

## 2016-04-02 NOTE — Telephone Encounter (Signed)
LMTCB

## 2016-04-02 NOTE — Telephone Encounter (Signed)
-----   Message from Lorie PhenixNancy Maloney, MD sent at 03/31/2016  6:45 AM EDT ----- Labs look very good except for low normal B12. Would add B12 supplement 1000 mg daily. Thanks.

## 2016-04-02 NOTE — Telephone Encounter (Signed)
Advised pt of lab results. Pt verbally acknowledges understanding. Emily Drozdowski, CMA   

## 2016-06-09 ENCOUNTER — Other Ambulatory Visit: Payer: Self-pay | Admitting: Family Medicine

## 2016-06-09 DIAGNOSIS — M5440 Lumbago with sciatica, unspecified side: Secondary | ICD-10-CM

## 2016-07-27 ENCOUNTER — Encounter: Payer: Self-pay | Admitting: Physician Assistant

## 2016-07-27 ENCOUNTER — Ambulatory Visit (INDEPENDENT_AMBULATORY_CARE_PROVIDER_SITE_OTHER): Payer: 59 | Admitting: Physician Assistant

## 2016-07-27 VITALS — BP 130/80 | HR 87 | Temp 97.5°F | Resp 16 | Wt 221.8 lb

## 2016-07-27 DIAGNOSIS — E6609 Other obesity due to excess calories: Secondary | ICD-10-CM | POA: Diagnosis not present

## 2016-07-27 DIAGNOSIS — E538 Deficiency of other specified B group vitamins: Secondary | ICD-10-CM | POA: Diagnosis not present

## 2016-07-27 DIAGNOSIS — Z6836 Body mass index (BMI) 36.0-36.9, adult: Secondary | ICD-10-CM

## 2016-07-27 DIAGNOSIS — I10 Essential (primary) hypertension: Secondary | ICD-10-CM | POA: Diagnosis not present

## 2016-07-27 NOTE — Patient Instructions (Signed)
DASH Eating Plan  DASH stands for "Dietary Approaches to Stop Hypertension." The DASH eating plan is a healthy eating plan that has been shown to reduce high blood pressure (hypertension). Additional health benefits may include reducing the risk of type 2 diabetes mellitus, heart disease, and stroke. The DASH eating plan may also help with weight loss.  WHAT DO I NEED TO KNOW ABOUT THE DASH EATING PLAN?  For the DASH eating plan, you will follow these general guidelines:  · Choose foods with a percent daily value for sodium of less than 5% (as listed on the food label).  · Use salt-free seasonings or herbs instead of table salt or sea salt.  · Check with your health care provider or pharmacist before using salt substitutes.  · Eat lower-sodium products, often labeled as "lower sodium" or "no salt added."  · Eat fresh foods.  · Eat more vegetables, fruits, and low-fat dairy products.  · Choose whole grains. Look for the word "whole" as the first word in the ingredient list.  · Choose fish and skinless chicken or turkey more often than red meat. Limit fish, poultry, and meat to 6 oz (170 g) each day.  · Limit sweets, desserts, sugars, and sugary drinks.  · Choose heart-healthy fats.  · Limit cheese to 1 oz (28 g) per day.  · Eat more home-cooked food and less restaurant, buffet, and fast food.  · Limit fried foods.  · Cook foods using methods other than frying.  · Limit canned vegetables. If you do use them, rinse them well to decrease the sodium.  · When eating at a restaurant, ask that your food be prepared with less salt, or no salt if possible.  WHAT FOODS CAN I EAT?  Seek help from a dietitian for individual calorie needs.  Grains  Whole grain or whole wheat bread. Brown rice. Whole grain or whole wheat pasta. Quinoa, bulgur, and whole grain cereals. Low-sodium cereals. Corn or whole wheat flour tortillas. Whole grain cornbread. Whole grain crackers. Low-sodium crackers.  Vegetables  Fresh or frozen vegetables  (raw, steamed, roasted, or grilled). Low-sodium or reduced-sodium tomato and vegetable juices. Low-sodium or reduced-sodium tomato sauce and paste. Low-sodium or reduced-sodium canned vegetables.   Fruits  All fresh, canned (in natural juice), or frozen fruits.  Meat and Other Protein Products  Ground beef (85% or leaner), grass-fed beef, or beef trimmed of fat. Skinless chicken or turkey. Ground chicken or turkey. Pork trimmed of fat. All fish and seafood. Eggs. Dried beans, peas, or lentils. Unsalted nuts and seeds. Unsalted canned beans.  Dairy  Low-fat dairy products, such as skim or 1% milk, 2% or reduced-fat cheeses, low-fat ricotta or cottage cheese, or plain low-fat yogurt. Low-sodium or reduced-sodium cheeses.  Fats and Oils  Tub margarines without trans fats. Light or reduced-fat mayonnaise and salad dressings (reduced sodium). Avocado. Safflower, olive, or canola oils. Natural peanut or almond butter.  Other  Unsalted popcorn and pretzels.  The items listed above may not be a complete list of recommended foods or beverages. Contact your dietitian for more options.  WHAT FOODS ARE NOT RECOMMENDED?  Grains  White bread. White pasta. White rice. Refined cornbread. Bagels and croissants. Crackers that contain trans fat.  Vegetables  Creamed or fried vegetables. Vegetables in a cheese sauce. Regular canned vegetables. Regular canned tomato sauce and paste. Regular tomato and vegetable juices.  Fruits  Dried fruits. Canned fruit in light or heavy syrup. Fruit juice.  Meat and Other Protein   Products  Fatty cuts of meat. Ribs, chicken wings, bacon, sausage, bologna, salami, chitterlings, fatback, hot dogs, bratwurst, and packaged luncheon meats. Salted nuts and seeds. Canned beans with salt.  Dairy  Whole or 2% milk, cream, half-and-half, and cream cheese. Whole-fat or sweetened yogurt. Full-fat cheeses or blue cheese. Nondairy creamers and whipped toppings. Processed cheese, cheese spreads, or cheese  curds.  Condiments  Onion and garlic salt, seasoned salt, table salt, and sea salt. Canned and packaged gravies. Worcestershire sauce. Tartar sauce. Barbecue sauce. Teriyaki sauce. Soy sauce, including reduced sodium. Steak sauce. Fish sauce. Oyster sauce. Cocktail sauce. Horseradish. Ketchup and mustard. Meat flavorings and tenderizers. Bouillon cubes. Hot sauce. Tabasco sauce. Marinades. Taco seasonings. Relishes.  Fats and Oils  Butter, stick margarine, lard, shortening, ghee, and bacon fat. Coconut, palm kernel, or palm oils. Regular salad dressings.  Other  Pickles and olives. Salted popcorn and pretzels.  The items listed above may not be a complete list of foods and beverages to avoid. Contact your dietitian for more information.  WHERE CAN I FIND MORE INFORMATION?  National Heart, [employer], [employer] Institute: www.nhlbi.nih.gov/health/health-topics/topics/dash/     This information is not intended to replace advice given to you by your health care provider. Make sure you discuss any questions you have with your health care provider.     Document Released: 09/27/2011 Document Revised: 10/29/2014 Document Reviewed: 08/12/2013  Elsevier Interactive Patient Education ©2016 Elsevier Inc.

## 2016-07-27 NOTE — Progress Notes (Signed)
Patient: Ashley Ballard Female    DOB: 03/16/1966   50 y.o.   MRN: [id number] Visit Date: 07/27/2016  Today's Provider: Margaretann Loveless, PA-C   Chief Complaint  Patient presents with  . Health Screening Form  . Hypertension    Follow-up   Subjective:    HPI Patient is here to get her health screening appeal form to be fill out and also to follow-up on Hypertension. Patient reports she gets her Influenza vaccine through work.   Hypertension, follow-up:  BP Readings from Last 3 Encounters:  07/27/16 130/80  03/30/16 122/84  01/17/16 (!) 152/91    She was last seen for hypertension 3 months ago.  BP at that visit was 122/84. Management since that visit includes Rest. HCTZ. She reports excellent compliance with treatment. She is not having side effects.  She is exercising. Walks 5 days a week. She is doing weight watchers. She is adherent to low salt diet.   Outside blood pressures are 120's/80's. She is experiencing none.  Patient denies chest pain, chest pressure/discomfort, exertional chest pressure/discomfort, fatigue, irregular heart beat, lower extremity edema and palpitations.   Cardiovascular risk factors include hypertension.   Weight trend: fluctuating a bit Wt Readings from Last 3 Encounters:  07/27/16 221 lb 12.8 oz (100.6 kg)  03/30/16 219 lb (99.3 kg)  01/17/16 224 lb (101.6 kg)    Current diet: in general, a "healthy" diet    ------------------------------------------------------------------------     Allergies  Allergen Reactions  . Lisinopril Itching    Mouth Itching     Current Outpatient Prescriptions:  .  calcium carbonate (OS-CAL) 600 MG TABS tablet, Take 1 tablet by mouth daily., Disp: , Rfl:  .  cholecalciferol (VITAMIN D) 1000 UNITS tablet, Take 5 tablets by mouth daily., Disp: , Rfl:  .  cyclobenzaprine (FLEXERIL) 5 MG tablet, Take 1 tablet (5 mg total) by mouth at bedtime., Disp: 30 tablet, Rfl: 5 .  dexlansoprazole  (DEXILANT) 60 MG capsule, Take 1 capsule by mouth daily. Reported on 12/30/2015, Disp: , Rfl:  .  fluticasone (FLONASE) 50 MCG/ACT nasal spray, Place 2 sprays into both nostrils daily., Disp: 16 g, Rfl: 11 .  hydrochlorothiazide (HYDRODIURIL) 12.5 MG tablet, Take 1 tablet (12.5 mg total) by mouth daily., Disp: 90 tablet, Rfl: 3 .  ibuprofen (ADVIL,MOTRIN) 800 MG tablet, Take 1 tablet by mouth  every 8 hours as needed, Disp: 180 tablet, Rfl: 1 .  loratadine (CLARITIN REDITABS) 10 MG dissolvable tablet, Take 1 tablet by mouth daily., Disp: , Rfl:  .  montelukast (SINGULAIR) 10 MG tablet, Take 1 tablet (10 mg total) by mouth at bedtime., Disp: 90 tablet, Rfl: 3  Review of Systems  Constitutional: Negative for fatigue.  Respiratory: Negative for cough, chest tightness and shortness of breath.   Cardiovascular: Negative for chest pain, palpitations and leg swelling.  Gastrointestinal: Negative for abdominal pain.  Neurological: Negative for dizziness, syncope, light-headedness and headaches.    Social History  Substance Use Topics  . Smoking status: Never Smoker  . Smokeless tobacco: Never Used  . Alcohol use No   Objective:   BP 130/80 (BP Location: Right Arm, Patient Position: Sitting, Cuff Size: Normal)   Pulse 87   Temp 97.5 F (36.4 C) (Oral)   Resp 16   Wt 221 lb 12.8 oz (100.6 kg)   BMI 36.91 kg/m   Physical Exam  Constitutional: She appears well-developed and well-nourished. No distress.  Neck: Normal range of  motion. Neck supple. No tracheal deviation present. No thyromegaly present.  Cardiovascular: Normal rate, regular rhythm and normal heart sounds.  Exam reveals no gallop and no friction rub.   No murmur heard. Pulmonary/Chest: Effort normal and breath sounds normal. No respiratory distress. She has no wheezes. She has no rales.  Musculoskeletal: She exhibits no edema.  Lymphadenopathy:    She has no cervical adenopathy.  Skin: She is not diaphoretic.  Vitals  reviewed.     Assessment & Plan:     1. Essential hypertension Stable. Continue current medical treatment plan. Will check labs as below and f/u pending results. - Comprehensive Metabolic Panel (CMET) - CBC with Differential  2. Class 2 obesity due to excess calories without serious comorbidity with body mass index (BMI) of 36.0 to 36.9 in adult Appeal form completed. Patient is walking 5 days per week x 1 hour each day. She is also using weight watchers plan through her employer. - Comprehensive Metabolic Panel (CMET) - CBC with Differential  3. B12 deficiency H/O low normal and now on B12 supplement. Will check labs as below and f/u pending results. - B12       Margaretann LovelessJennifer M Athziri Freundlich, PA-C  [facility]

## 2016-07-28 LAB — COMPREHENSIVE METABOLIC PANEL
A/G RATIO: 1.5 (ref 1.2–2.2)
ALK PHOS: 74 IU/L (ref 39–117)
ALT: 20 IU/L (ref 0–32)
AST: 18 IU/L (ref 0–40)
Albumin: 4.3 g/dL (ref 3.5–5.5)
BUN/Creatinine Ratio: 18 (ref 9–23)
BUN: 14 mg/dL (ref 6–24)
Bilirubin Total: 0.8 mg/dL (ref 0.0–1.2)
CHLORIDE: 104 mmol/L (ref 96–106)
CO2: 25 mmol/L (ref 18–29)
Calcium: 9.2 mg/dL (ref 8.7–10.2)
Creatinine, Ser: 0.79 mg/dL (ref 0.57–1.00)
GFR calc Af Amer: 100 mL/min/{1.73_m2} (ref >59)
GFR calc non Af Amer: 86 mL/min/{1.73_m2} (ref >59)
GLOBULIN, TOTAL: 2.8 g/dL (ref 1.5–4.5)
Glucose: 86 mg/dL (ref 65–99)
POTASSIUM: 4.1 mmol/L (ref 3.5–5.2)
SODIUM: 142 mmol/L (ref 134–144)
Total Protein: 7.1 g/dL (ref 6.0–8.5)

## 2016-07-28 LAB — CBC WITH DIFFERENTIAL/PLATELET
BASOS ABS: 0 10*3/uL (ref 0.0–0.2)
Basos: 0 %
EOS (ABSOLUTE): 0.2 10*3/uL (ref 0.0–0.4)
Eos: 3 %
Hematocrit: 35.8 % (ref 34.0–46.6)
Hemoglobin: 12.2 g/dL (ref 11.1–15.9)
IMMATURE GRANULOCYTES: 0 %
Immature Grans (Abs): 0 10*3/uL (ref 0.0–0.1)
LYMPHS: 35 %
Lymphocytes Absolute: 2.1 10*3/uL (ref 0.7–3.1)
MCH: 32.1 pg (ref 26.6–33.0)
MCHC: 34.1 g/dL (ref 31.5–35.7)
MCV: 94 fL (ref 79–97)
MONOS ABS: 0.4 10*3/uL (ref 0.1–0.9)
Monocytes: 7 %
NEUTROS PCT: 55 %
Neutrophils Absolute: 3.4 10*3/uL (ref 1.4–7.0)
PLATELETS: 242 10*3/uL (ref 150–379)
RBC: 3.8 x10E6/uL (ref 3.77–5.28)
RDW: 12.8 % (ref 12.3–15.4)
WBC: 6.1 10*3/uL (ref 3.4–10.8)

## 2016-07-28 LAB — VITAMIN B12

## 2016-07-30 ENCOUNTER — Telehealth: Payer: Self-pay

## 2016-07-30 NOTE — Telephone Encounter (Signed)
-----   Message from Margaretann LovelessJennifer M Burnette, PA-C sent at 07/28/2016  9:45 AM EDT ----- B12 is normal now. All other labs stable and WNL as well.

## 2016-07-30 NOTE — Telephone Encounter (Signed)
Tried calling pt. Phone was off and VM was full. Will try again later. Allene DillonEmily Drozdowski, CMA

## 2016-07-31 NOTE — Telephone Encounter (Signed)
Patient has been advised. KW 

## 2016-07-31 NOTE — Telephone Encounter (Signed)
Unable to reach patient at this time, home phone/cell phone state that patient is unavailable at this time. Will try contacting patient again at a later time. KW

## 2016-09-12 ENCOUNTER — Ambulatory Visit (INDEPENDENT_AMBULATORY_CARE_PROVIDER_SITE_OTHER): Payer: 59 | Admitting: Physician Assistant

## 2016-09-12 ENCOUNTER — Encounter: Payer: Self-pay | Admitting: Physician Assistant

## 2016-09-12 VITALS — BP 138/80 | HR 101 | Temp 98.3°F | Resp 16 | Wt 222.2 lb

## 2016-09-12 DIAGNOSIS — J0140 Acute pansinusitis, unspecified: Secondary | ICD-10-CM | POA: Diagnosis not present

## 2016-09-12 MED ORDER — PREDNISONE 10 MG (21) PO TBPK: ORAL_TABLET | ORAL | 0 refills | Status: DC

## 2016-09-12 MED ORDER — AZITHROMYCIN 250 MG PO TABS
ORAL_TABLET | ORAL | 0 refills | Status: DC
Start: 2016-09-12 — End: 2017-03-29

## 2016-09-12 NOTE — Progress Notes (Signed)
Patient: Ashley NavyFelicia B Bantz Female    DOB: 22-Feb-1966   50 y.o.   MRN: [id number] Visit Date: 09/12/2016  Today's Provider: Margaretann LovelessJennifer M Burnette, PA-C   Chief Complaint  Patient presents with  . Sinusitis   Subjective:    HPI Patient is here with c/o of her sinusitis. She reports that she has sinus pain and headache with neck pain. The symptoms are not constantly. She denies cough,ear pain or fever.  Symptoms started with diarrhea and nausea/vomitng on Sunday.She was at a family gathering on Saturday and had some meat that had been sitting out at room temperature. She feels that this is what caused the GI and Tums that she developed Sunday. She did seclude herself in a room with no air circulation and dry air for all of Sunday and Monday. She feels that this is most likely what has caused her sinus symptoms as stated above.     Allergies  Allergen Reactions  . Lisinopril Itching    Mouth Itching     Current Outpatient Prescriptions:  .  calcium carbonate (OS-CAL) 600 MG TABS tablet, Take 1 tablet by mouth daily., Disp: , Rfl:  .  cholecalciferol (VITAMIN D) 1000 UNITS tablet, Take 5 tablets by mouth daily., Disp: , Rfl:  .  cyclobenzaprine (FLEXERIL) 5 MG tablet, Take 1 tablet (5 mg total) by mouth at bedtime., Disp: 30 tablet, Rfl: 5 .  dexlansoprazole (DEXILANT) 60 MG capsule, Take 1 capsule by mouth daily. Reported on 12/30/2015, Disp: , Rfl:  .  fluticasone (FLONASE) 50 MCG/ACT nasal spray, Place 2 sprays into both nostrils daily., Disp: 16 g, Rfl: 11 .  hydrochlorothiazide (HYDRODIURIL) 12.5 MG tablet, Take 1 tablet (12.5 mg total) by mouth daily., Disp: 90 tablet, Rfl: 3 .  ibuprofen (ADVIL,MOTRIN) 800 MG tablet, Take 1 tablet by mouth  every 8 hours as needed, Disp: 180 tablet, Rfl: 1 .  loratadine (CLARITIN REDITABS) 10 MG dissolvable tablet, Take 1 tablet by mouth daily., Disp: , Rfl:  .  montelukast (SINGULAIR) 10 MG tablet, Take 1 tablet (10 mg total) by mouth at  bedtime., Disp: 90 tablet, Rfl: 3  Review of Systems  HENT: Positive for congestion, sinus pain, sinus pressure and sore throat (off and on). Negative for ear pain, hearing loss, postnasal drip, rhinorrhea, sneezing, tinnitus and trouble swallowing.   Eyes: Negative for visual disturbance.  Respiratory: Negative for cough, chest tightness, shortness of breath and wheezing.   Cardiovascular: Negative for chest pain, palpitations and leg swelling.  Gastrointestinal: Negative for abdominal pain and nausea (improved now).  Neurological: Positive for headaches. Negative for dizziness.    Social History  Substance Use Topics  . Smoking status: Never Smoker  . Smokeless tobacco: Never Used  . Alcohol use No   Objective:   BP 138/80 (BP Location: Right Arm, Patient Position: Sitting, Cuff Size: Normal)   Pulse (!) 101   Temp 98.3 F (36.8 C) (Oral)   Resp 16   Wt 222 lb 3.2 oz (100.8 kg)   BMI 36.98 kg/m   Physical Exam  Constitutional: She appears well-developed and well-nourished. No distress.  HENT:  Head: Normocephalic and atraumatic.    Right Ear: Hearing, tympanic membrane, external ear and ear canal normal.  Left Ear: Hearing, tympanic membrane, external ear and ear canal normal.  Nose: Mucosal edema present. No rhinorrhea. Right sinus exhibits maxillary sinus tenderness and frontal sinus tenderness. Left sinus exhibits maxillary sinus tenderness and frontal sinus tenderness.  Mouth/Throat: Uvula is midline, oropharynx is clear and moist and mucous membranes are normal. No oropharyngeal exudate, posterior oropharyngeal edema or posterior oropharyngeal erythema.  Neck: Normal range of motion. Neck supple. No tracheal deviation present. No thyromegaly present.  Cardiovascular: Normal rate, regular rhythm and normal heart sounds.  Exam reveals no gallop and no friction rub.   No murmur heard. Pulmonary/Chest: Effort normal and breath sounds normal. No stridor. No respiratory  distress. She has no wheezes. She has no rales.  Lymphadenopathy:    She has no cervical adenopathy.  Skin: She is not diaphoretic.  Vitals reviewed.     Assessment & Plan:     1. Acute pansinusitis, recurrence not specified Worsening symptoms that have not responded to OTC medications. Will give Z-Pak and prednisone as below. Continue allergy medications. Stay well hydrated and get plenty of rest. Call if no symptom improvement or if symptoms worsen. - predniSONE (STERAPRED UNI-PAK 21 TAB) 10 MG (21) TBPK tablet; Take as directed on package directions.  Dispense: 21 tablet; Refill: 0 - azithromycin (ZITHROMAX) 250 MG tablet; Take 2 tablets PO on day one, and one tablet PO daily thereafter until completed.  Dispense: 6 tablet; Refill: 0       Margaretann LovelessJennifer M Burnette, PA-C  [facility]

## 2016-09-12 NOTE — Patient Instructions (Signed)

## 2016-10-23 IMAGING — MG MM DIGITAL SCREENING BILAT W/ CAD
4 series · 4 of 4 positions shown · non-contrast
Comparison: Previous exam(s).

CLINICAL DATA: Screening.

EXAM:
DIGITAL SCREENING BILATERAL MAMMOGRAM WITH CAD

[R MLO]
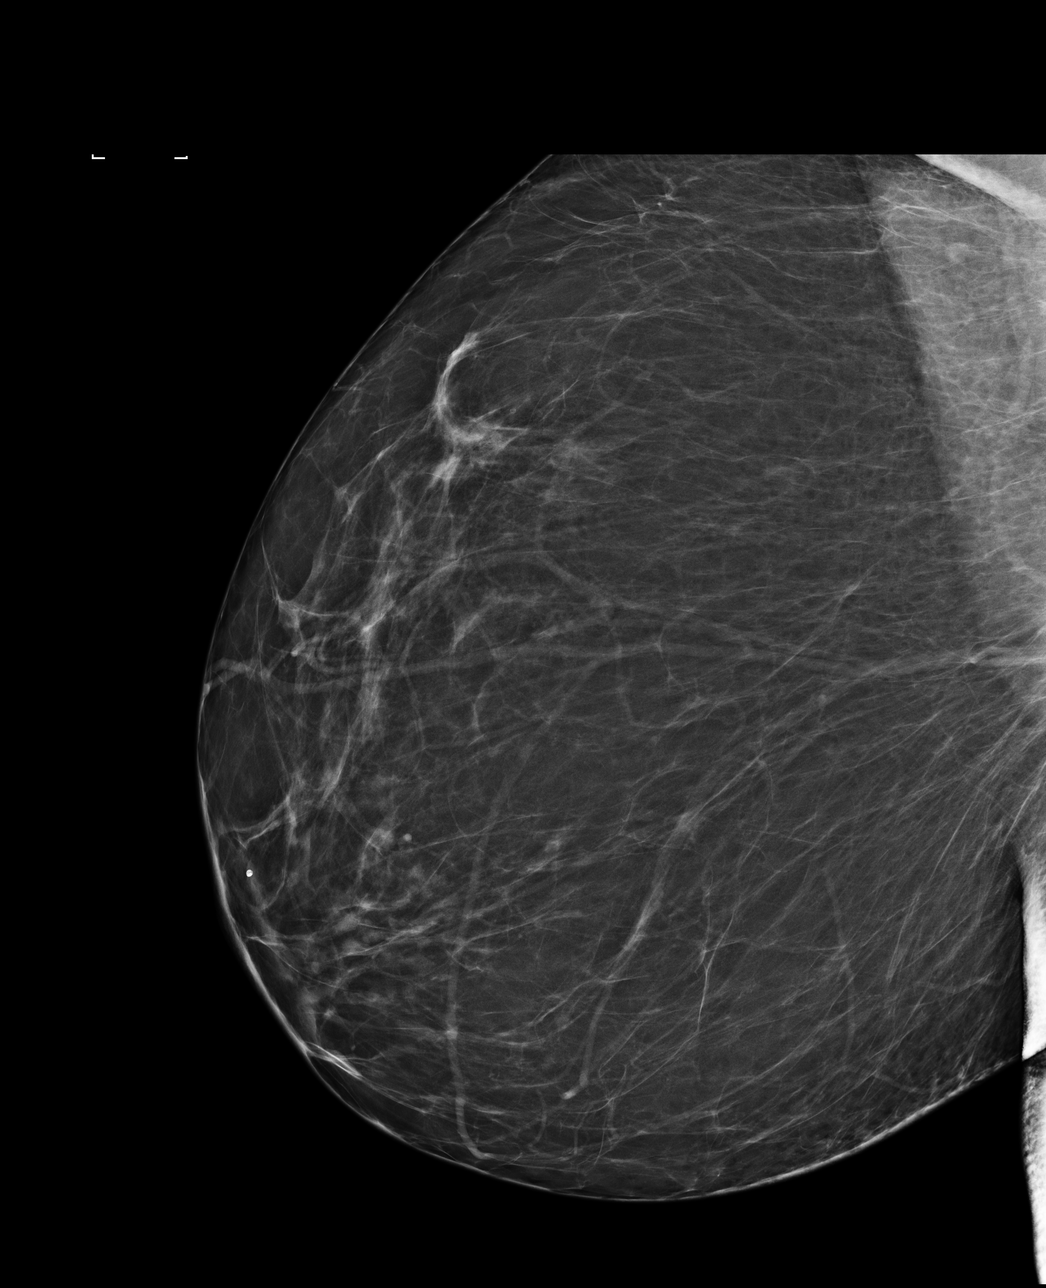

[L CC]
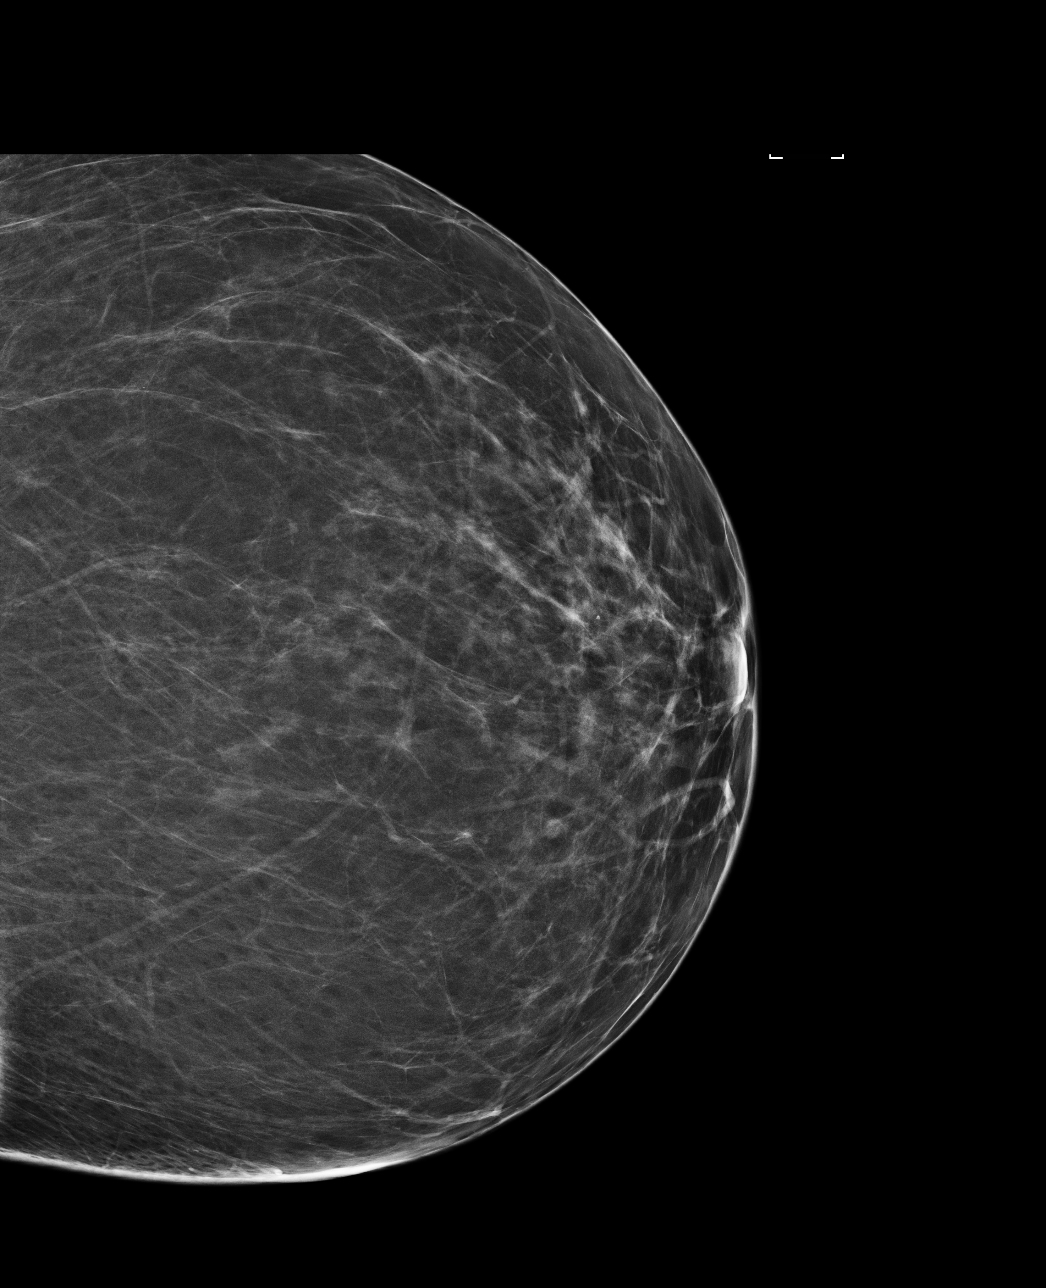

[L MLO]
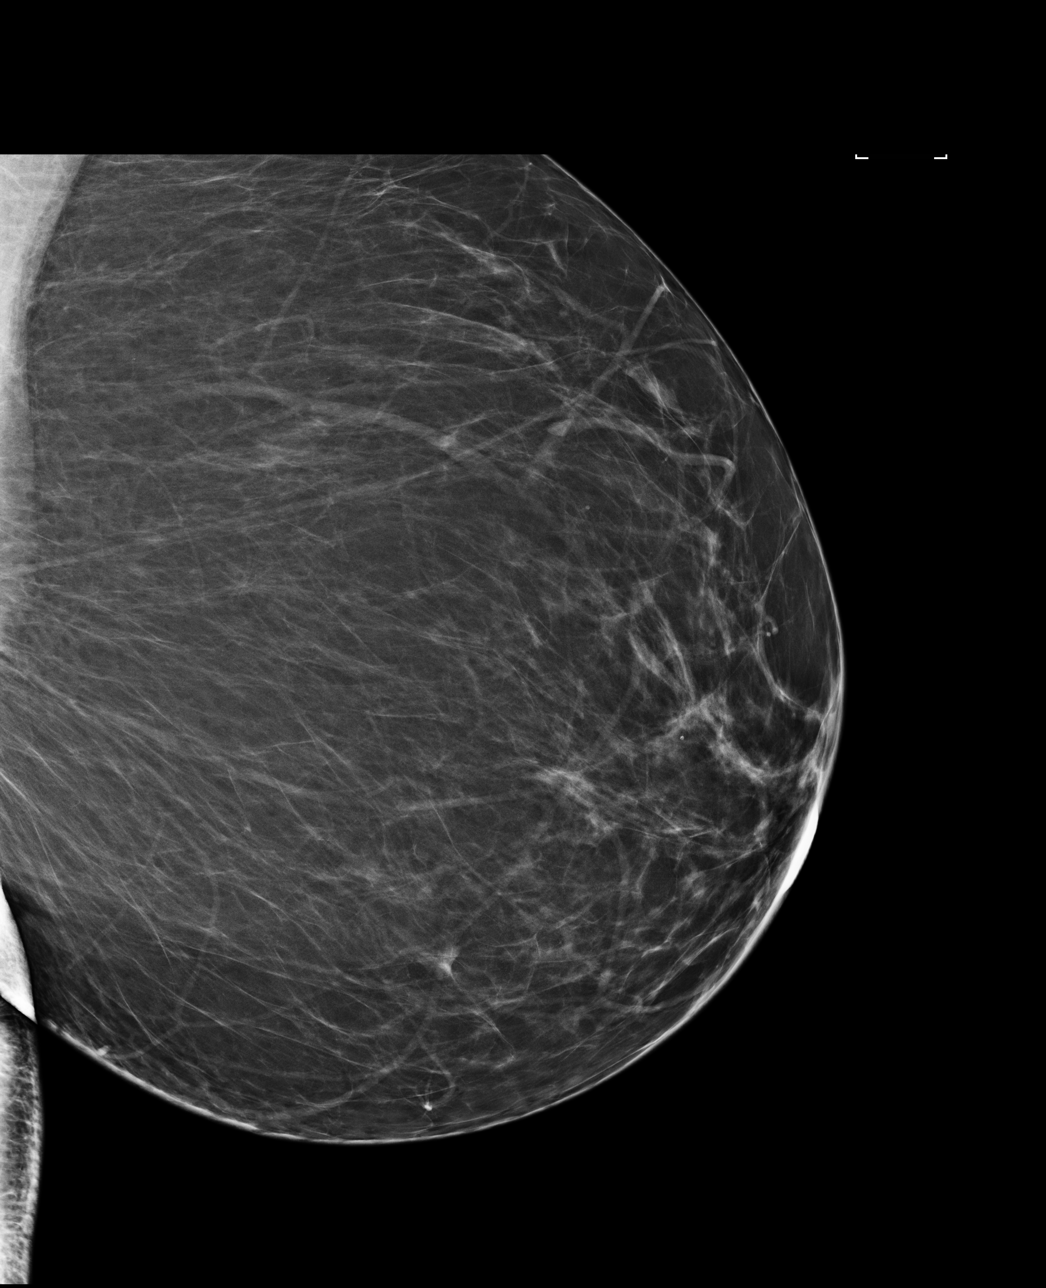

[R CC]
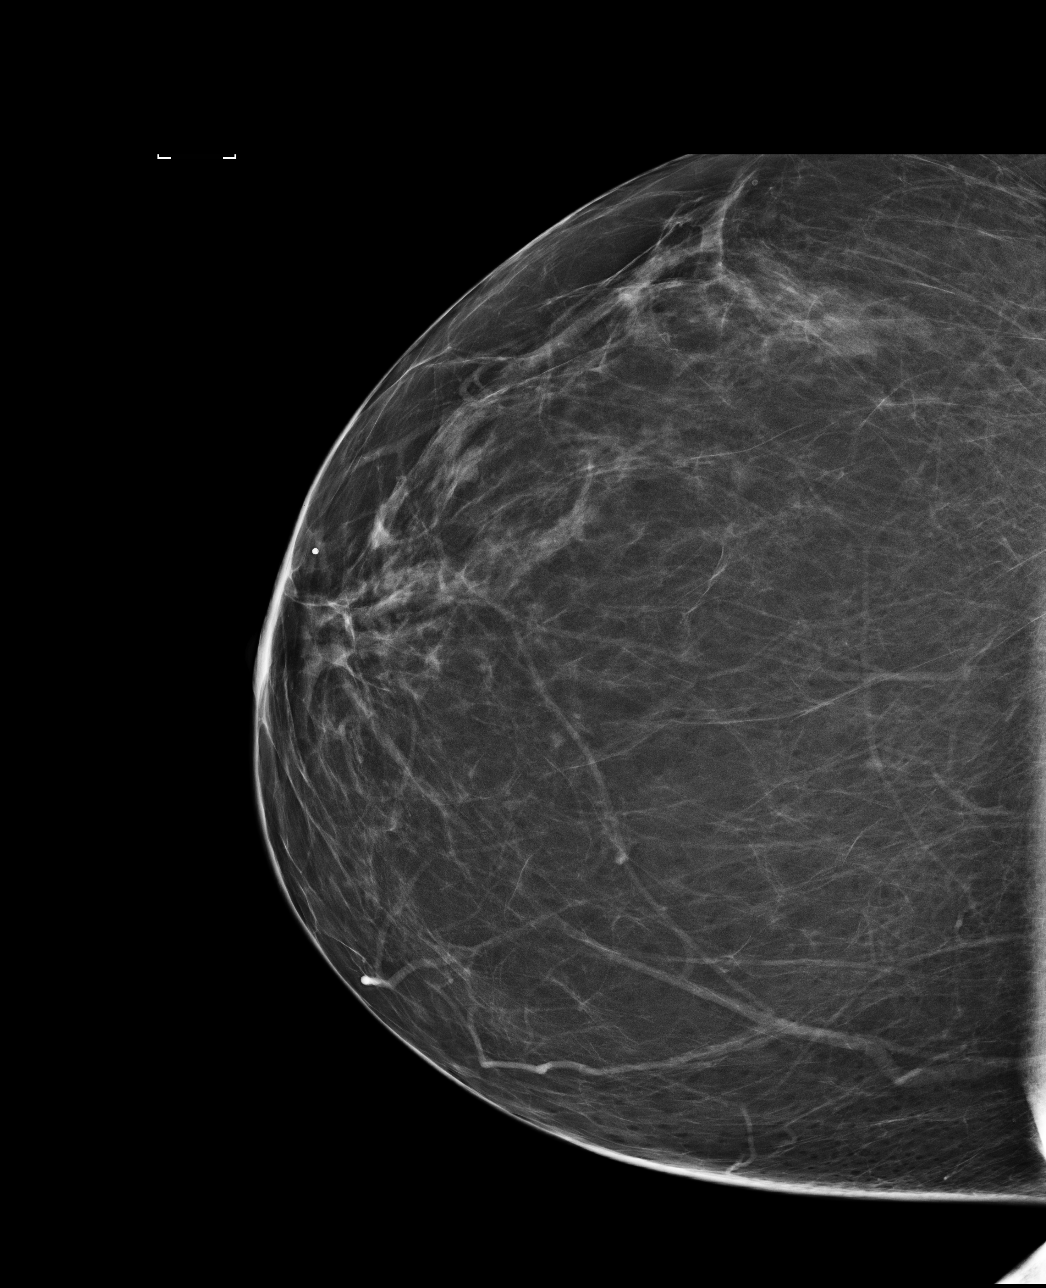

[4 of 4 positions shown; findings below may reference images not displayed]

ACR Breast Density Category b: There are scattered areas of
fibroglandular density.
FINDINGS: There are no findings suspicious for malignancy. Images were
processed with CAD.
IMPRESSION: No mammographic evidence of malignancy. A result letter of this
screening mammogram will be mailed directly to the patient.

RECOMMENDATION:
Screening mammogram in one year. (Code:AS-G-LCT)

BI-RADS CATEGORY  1: Negative.

## 2016-10-31 ENCOUNTER — Other Ambulatory Visit: Payer: Self-pay | Admitting: Physician Assistant

## 2016-10-31 DIAGNOSIS — Z1231 Encounter for screening mammogram for malignant neoplasm of breast: Secondary | ICD-10-CM

## 2016-11-30 ENCOUNTER — Ambulatory Visit
Admission: RE | Admit: 2016-11-30 | Discharge: 2016-11-30 | Disposition: A | Discharge status: Home / Self Care | Payer: 59 | Source: Ambulatory Visit | Attending: Physician Assistant | Admitting: Physician Assistant

## 2016-11-30 ENCOUNTER — Telehealth: Payer: Self-pay

## 2016-11-30 DIAGNOSIS — Z1231 Encounter for screening mammogram for malignant neoplasm of breast: Secondary | ICD-10-CM | POA: Diagnosis present

## 2016-11-30 NOTE — Telephone Encounter (Signed)
Pt advised. Emily Drozdowski, CMA  

## 2016-11-30 NOTE — Telephone Encounter (Signed)
-----   Message from Margaretann LovelessJennifer M Burnette, [place]PA-C sent at 11/30/2016 11:15 AM EST ----- Normal mammogram. Repeat screening in one year.

## 2017-02-11 ENCOUNTER — Other Ambulatory Visit: Payer: Self-pay | Admitting: Physician Assistant

## 2017-02-11 DIAGNOSIS — I10 Essential (primary) hypertension: Secondary | ICD-10-CM

## 2017-02-11 MED ORDER — HYDROCHLOROTHIAZIDE 12.5 MG PO TABS: 12.5000 mg | ORAL_TABLET | Freq: Every day | ORAL | 3 refills | Status: DC

## 2017-02-11 NOTE — Telephone Encounter (Signed)
[facility] faxed a request on the following medication. Thanks CC  hydrochlorothiazide (HYDRODIURIL) 12.5 MG tablet  Take 1 tablet by mouth daily.

## 2017-03-28 ENCOUNTER — Ambulatory Visit: Payer: 59 | Admitting: Physician Assistant

## 2017-03-29 ENCOUNTER — Ambulatory Visit: Payer: 59 | Admitting: Physician Assistant

## 2017-03-29 ENCOUNTER — Encounter: Payer: Self-pay | Admitting: Physician Assistant

## 2017-03-29 ENCOUNTER — Ambulatory Visit (INDEPENDENT_AMBULATORY_CARE_PROVIDER_SITE_OTHER): Payer: 59 | Admitting: Physician Assistant

## 2017-03-29 VITALS — BP 110/76 | HR 76 | Temp 97.9°F | Resp 16 | Wt 228.0 lb

## 2017-03-29 DIAGNOSIS — R233 Spontaneous ecchymoses: Secondary | ICD-10-CM | POA: Diagnosis not present

## 2017-03-29 NOTE — Progress Notes (Signed)
Patient: Ashley NavyFelicia B Ballard Female    DOB: [birth year]   51 y.o.   MRN: [id number] Visit Date: 03/29/2017  Today's Provider: Margaretann LovelessJennifer M Burnette, PA-C   Chief Complaint  Patient presents with  . Rash   Subjective:    Rash  This is a new problem. The current episode started in the past 7 days (x 3 days, after going for a walk outside). The problem has been gradually improving (is now itching) since onset. Location: where the sun is exposed. The rash is characterized by itchiness and redness. Pertinent negatives include no anorexia, congestion, cough, diarrhea, eye pain, facial edema, fatigue, fever, joint pain, nail changes, rhinorrhea, shortness of breath, sore throat or vomiting. Treatments tried: wearing clothes that protect skin from sun. The treatment provided moderate relief.    [facility] by Dr. Hyacinth MeekerMiller on 03/27/2017 for this rash. CBC was checked, and was normal. Dr. Hyacinth MeekerMiller believes this rash was secondary to sun exposure, and considered changing her HCTZ. Patient does not wish to discontinue HCTZ as her BP has been well controlled on it.   Allergies  Allergen Reactions  . Lisinopril Itching    Mouth Itching     Current Outpatient Prescriptions:  .  calcium carbonate (OS-CAL) 600 MG TABS tablet, Take 1 tablet by mouth daily., Disp: , Rfl:  .  cholecalciferol (VITAMIN D) 1000 UNITS tablet, Take 5 tablets by mouth daily., Disp: , Rfl:  .  cyanocobalamin 1000 MCG tablet, Take 1,000 mcg by mouth daily., Disp: , Rfl:  .  cyclobenzaprine (FLEXERIL) 5 MG tablet, Take 1 tablet (5 mg total) by mouth at bedtime., Disp: 30 tablet, Rfl: 5 .  fluticasone (FLONASE) 50 MCG/ACT nasal spray, Place 2 sprays into both nostrils daily., Disp: 16 g, Rfl: 11 .  hydrochlorothiazide (HYDRODIURIL) 12.5 MG tablet, Take 1 tablet (12.5 mg total) by mouth daily., Disp: 90 tablet, Rfl: 3 .  ibuprofen (ADVIL,MOTRIN) 800 MG tablet, Take 1 tablet by mouth  every 8 hours as needed, Disp:  180 tablet, Rfl: 1 .  loratadine (CLARITIN REDITABS) 10 MG dissolvable tablet, Take 1 tablet by mouth daily., Disp: , Rfl:  .  montelukast (SINGULAIR) 10 MG tablet, Take 1 tablet (10 mg total) by mouth at bedtime., Disp: 90 tablet, Rfl: 3  Review of Systems  Constitutional: Negative for fatigue and fever.  HENT: Negative for congestion, rhinorrhea and sore throat.   Eyes: Negative for pain.  Respiratory: Negative for cough and shortness of breath.   Gastrointestinal: Negative for anorexia, diarrhea and vomiting.  Musculoskeletal: Negative for joint pain.  Skin: Positive for rash. Negative for nail changes.    Social History  Substance Use Topics  . Smoking status: Never Smoker  . Smokeless tobacco: Never Used  . Alcohol use No   Objective:   BP 110/76 (BP Location: Right Arm, Patient Position: Sitting, Cuff Size: Large)   Pulse 76   Temp 97.9 F (36.6 C) (Oral)   Resp 16   Wt 228 lb (103.4 kg)   BMI 37.94 kg/m  Vitals:   03/29/17 1133  BP: 110/76  Pulse: 76  Resp: 16  Temp: 97.9 F (36.6 C)  TempSrc: Oral  Weight: 228 lb (103.4 kg)     Physical Exam  Constitutional: She appears well-developed and well-nourished. No distress.  Neck: Normal range of motion. Neck supple.  Cardiovascular: Normal rate, regular rhythm and normal heart sounds.  Exam reveals no gallop and no  friction rub.   No murmur heard. Pulmonary/Chest: Effort normal and breath sounds normal. No respiratory distress. She has no wheezes. She has no rales.  Skin: Rash (faint petechial rash still remains on right forearm and hand, some noted on the tops of feet bilaterally) noted. She is not diaphoretic.  Vitals reviewed.       Assessment & Plan:      1. Petechial rash Improving. Continue to push fluids and use protective clothing or sunscreen when in the sun. Will continue HCTZ for now and will discuss changing if petechial rash continues with sun exposure.        Margaretann Loveless, PA-C    [facility]

## 2017-03-29 NOTE — Patient Instructions (Signed)
Hydrochlorothiazide, HCTZ capsules or tablets What is this medicine? HYDROCHLOROTHIAZIDE (hye droe klor oh THYE a zide) is a diuretic. It increases the amount of urine passed, which causes the body to lose salt and water. This medicine is used to treat high blood pressure. It is also reduces the swelling and water retention caused by various medical conditions, such as heart, liver, or kidney disease. This medicine may be used for other purposes; ask your health care provider or pharmacist if you have questions. COMMON BRAND NAME(S): Esidrix, Ezide, HydroDIURIL, Microzide, Oretic, Zide What should I tell my health care provider before I take this medicine? They need to know if you have any of these conditions: -diabetes -gout -immune system problems, like lupus -kidney disease or kidney stones -liver disease -pancreatitis -small amount of urine or difficulty passing urine -an unusual or allergic reaction to hydrochlorothiazide, sulfa drugs, other medicines, foods, dyes, or preservatives -pregnant or trying to get pregnant -breast-feeding How should I use this medicine? Take this medicine by mouth with a glass of water. Follow the directions on the prescription label. Take your medicine at regular intervals. Remember that you will need to pass urine frequently after taking this medicine. Do not take your doses at a time of day that will cause you problems. Do not stop taking your medicine unless your doctor tells you to. Talk to your pediatrician regarding the use of this medicine in children. Special care may be needed. Overdosage: If you think you have taken too much of this medicine contact a poison control center or emergency room at once. NOTE: This medicine is only for you. Do not share this medicine with others. What if I miss a dose? If you miss a dose, take it as soon as you can. If it is almost time for your next dose, take only that dose. Do not take double or extra doses. What may  interact with this medicine? -cholestyramine -colestipol -digoxin -dofetilide -lithium -medicines for blood pressure -medicines for diabetes -medicines that relax muscles for surgery -other diuretics -steroid medicines like prednisone or cortisone This list may not describe all possible interactions. Give your health care provider a list of all the medicines, herbs, non-prescription drugs, or dietary supplements you use. Also tell them if you smoke, drink alcohol, or use illegal drugs. Some items may interact with your medicine. What should I watch for while using this medicine? Visit your doctor or health care professional for regular checks on your progress. Check your blood pressure as directed. Ask your doctor or health care professional what your blood pressure should be and when you should contact him or her. You may need to be on a special diet while taking this medicine. Ask your doctor. Check with your doctor or health care professional if you get an attack of severe diarrhea, nausea and vomiting, or if you sweat a lot. The loss of too much body fluid can make it dangerous for you to take this medicine. You may get drowsy or dizzy. Do not drive, use machinery, or do anything that needs mental alertness until you know how this medicine affects you. Do not stand or sit up quickly, especially if you are an older patient. This reduces the risk of dizzy or fainting spells. Alcohol may interfere with the effect of this medicine. Avoid alcoholic drinks. This medicine may affect your blood sugar level. If you have diabetes, check with your doctor or health care professional before changing the dose of your diabetic medicine. This medicine   can make you more sensitive to the sun. Keep out of the sun. If you cannot avoid being in the sun, wear protective clothing and use sunscreen. Do not use sun lamps or tanning beds/booths. What side effects may I notice from receiving this medicine? Side effects  that you should report to your doctor or health care professional as soon as possible: -allergic reactions such as skin rash or itching, hives, swelling of the lips, mouth, tongue, or throat -changes in vision -chest pain -eye pain -fast or irregular heartbeat -feeling faint or lightheaded, falls -gout attack -muscle pain or cramps -pain or difficulty when passing urine -pain, tingling, numbness in the hands or feet -redness, blistering, peeling or loosening of the skin, including inside the mouth -unusually weak or tired Side effects that usually do not require medical attention (report to your doctor or health care professional if they continue or are bothersome): -change in sex drive or performance -dry mouth -headache -stomach upset This list may not describe all possible side effects. Call your doctor for medical advice about side effects. You may report side effects to FDA at 1-800-FDA-1088. Where should I keep my medicine? Keep out of the reach of children. Store at room temperature between 15 and 30 degrees C (59 and 86 degrees F). Do not freeze. Protect from light and moisture. Keep container closed tightly. Throw away any unused medicine after the expiration date. NOTE: This sheet is a summary. It may not cover all possible information. If you have questions about this medicine, talk to your doctor, pharmacist, or health care provider.  2018 Elsevier/Gold Standard (2010-06-02 12:57:37)  

## 2017-04-12 ENCOUNTER — Encounter: Payer: Self-pay | Admitting: Physician Assistant

## 2017-04-12 ENCOUNTER — Ambulatory Visit (INDEPENDENT_AMBULATORY_CARE_PROVIDER_SITE_OTHER): Payer: 59 | Admitting: Physician Assistant

## 2017-04-12 ENCOUNTER — Other Ambulatory Visit: Payer: Self-pay | Admitting: Physician Assistant

## 2017-04-12 VITALS — BP 128/82 | Temp 98.1°F | Resp 16 | Ht 65.0 in | Wt 229.0 lb

## 2017-04-12 DIAGNOSIS — E559 Vitamin D deficiency, unspecified: Secondary | ICD-10-CM

## 2017-04-12 DIAGNOSIS — Z0000 Encounter for general adult medical examination without abnormal findings: Secondary | ICD-10-CM

## 2017-04-12 DIAGNOSIS — R233 Spontaneous ecchymoses: Secondary | ICD-10-CM | POA: Diagnosis not present

## 2017-04-12 DIAGNOSIS — Z6838 Body mass index (BMI) 38.0-38.9, adult: Secondary | ICD-10-CM

## 2017-04-12 DIAGNOSIS — E538 Deficiency of other specified B group vitamins: Secondary | ICD-10-CM | POA: Insufficient documentation

## 2017-04-12 DIAGNOSIS — I10 Essential (primary) hypertension: Secondary | ICD-10-CM | POA: Diagnosis not present

## 2017-04-12 NOTE — Progress Notes (Signed)
Patient: Ashley Ballard, Female    DOB: 09-01-1966, 51 y.o.   MRN: [id number] Visit Date: 04/12/2017  Today's Provider: Margaretann Loveless, PA-C   Chief Complaint  Patient presents with  . Annual Exam  . Rash   Subjective:    Annual physical exam Ashley Ballard is a 51 y.o. female who presents today for health maintenance and complete physical. She feels well. She reports exercising daily by walking. She reports she is sleeping well.   Rash: Patient was seen in the office about 2 weeks ago and was diagnosed with petechial rash. She thinks her rash worsens when she is out in the sun. She has been applying sunscreen and staying out of the sun, and it has helped a little. Her daughter does have ITP but when the petechia showed she went to a walk in clinic and her platelets were normal.   Review of Systems  Constitutional: Negative.   HENT: Negative.   Eyes: Negative.   Respiratory: Negative.   Cardiovascular: Negative.   Gastrointestinal: Negative.  Negative for abdominal distention.  Endocrine: Negative.   Genitourinary: Negative.   Musculoskeletal: Negative.   Skin: Negative.   Allergic/Immunologic: Negative.   Neurological: Negative.   Hematological: Negative.   Psychiatric/Behavioral: Negative.     Social History      She  reports that she has never smoked. She has never used smokeless tobacco. She reports that she does not drink alcohol or use drugs.       Social History   Social History  . Marital status: Married    Spouse name: Casimiro Needle  . Number of children: 2  . Years of education: N/A   Occupational History  . full time    Social History Main Topics  . Smoking status: Never Smoker  . Smokeless tobacco: Never Used  . Alcohol use No  . Drug use: No  . Sexual activity: Not Asked   Other Topics Concern  . None   Social History Narrative  . None    Past Medical History:  Diagnosis Date  . GERD (gastroesophageal reflux disease)   .  Hypertension      Patient Active Problem List   Diagnosis Date Noted  . Essential hypertension 01/06/2016  . Chronic sinusitis 05/02/2015  . Abnormal ECG 04/07/2015  . 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 Acid reflux 04/07/2015  . Low back pain with sciatica 04/07/2015  . Adiposity 04/07/2015  . Hemorrhage, postmenopausal 04/07/2015  . Pain 04/07/2015  . Avitaminosis D 04/07/2015    Past Surgical History:  Procedure Laterality Date  . BIOPSY ENDOMETRIAL  01/21/2014  . BREAST CYST EXCISION Right 1983  . TUBAL LIGATION      Family History        Family Status  Relation Status  . Mother Alive  . PGM Deceased  . PGF Deceased  . Sister Alive  . Brother Alive  . MGM Deceased  . Sister Alive  . Oneal Grout (Not Specified)  . MGF (Not Specified)        Her family history includes Breast cancer (age of onset: 27) in her mother; Breast cancer (age of onset: 76) in her maternal grandmother; Diabetes in her mother; Heart attack in her paternal uncle; Hypertension in her mother; Kidney cancer in her maternal grandfather; Leukemia in her paternal grandfather; Pneumonia in her paternal grandmother.     Allergies  Allergen Reactions  . Lisinopril Itching    Mouth Itching  Current Outpatient Prescriptions:  .  calcium carbonate (OS-CAL) 600 MG TABS tablet, Take 1 tablet by mouth daily., Disp: , Rfl:  .  cholecalciferol (VITAMIN D) 1000 UNITS tablet, Take 5 tablets by mouth daily., Disp: , Rfl:  .  cyanocobalamin 1000 MCG tablet, Take 1,000 mcg by mouth daily., Disp: , Rfl:  .  cyclobenzaprine (FLEXERIL) 5 MG tablet, Take 1 tablet (5 mg total) by mouth at bedtime., Disp: 30 tablet, Rfl: 5 .  fluticasone (FLONASE) 50 MCG/ACT nasal spray, Place 2 sprays into both nostrils daily., Disp: 16 g, Rfl: 11 .  hydrochlorothiazide (HYDRODIURIL) 12.5 MG tablet, Take 1 tablet (12.5 mg total) by mouth daily., Disp: 90 tablet, Rfl: 3 .  ibuprofen (ADVIL,MOTRIN) 800 MG tablet, Take 1 tablet by mouth   every 8 hours as needed, Disp: 180 tablet, Rfl: 1 .  loratadine (CLARITIN REDITABS) 10 MG dissolvable tablet, Take 1 tablet by mouth daily., Disp: , Rfl:  .  montelukast (SINGULAIR) 10 MG tablet, Take 1 tablet (10 mg total) by mouth at bedtime., Disp: 90 tablet, Rfl: 3   Patient Care Team: Margaretann LovelessBurnette, Stefon Ramthun M, PA-C as PCP - General (Physician Assistant)      Objective:   Vitals: BP 128/82 (BP Location: Right Arm, Patient Position: Sitting, Cuff Size: Normal)   Temp 98.1 F (36.7 C)   Resp 16   Ht 5\' 5"  (1.651 m)   Wt 229 lb (103.9 kg)   BMI 38.11 kg/m    Vitals:   04/12/17 0900  BP: 128/82  Resp: 16  Temp: 98.1 F (36.7 C)  Weight: 229 lb (103.9 kg)  Height: 5\' 5"  (1.651 m)     Physical Exam  Constitutional: She is oriented to person, place, and time. She appears well-developed and well-nourished. No distress.  HENT:  Head: Normocephalic and atraumatic.  Right Ear: Hearing, tympanic membrane, external ear and ear canal normal.  Left Ear: Hearing, tympanic membrane, external ear and ear canal normal.  Nose: Nose normal.  Mouth/Throat: Uvula is midline, oropharynx is clear and moist and mucous membranes are normal. No oropharyngeal exudate.  Eyes: Conjunctivae and EOM are normal. Pupils are equal, round, and reactive to light. Right eye exhibits no discharge. Left eye exhibits no discharge. No scleral icterus.  Neck: Normal range of motion. Neck supple. No JVD present. Carotid bruit is not present. No tracheal deviation present. No thyromegaly present.  Cardiovascular: Normal rate, regular rhythm, normal heart sounds and intact distal pulses.  Exam reveals no gallop and no friction rub.   No murmur heard. Pulmonary/Chest: Effort normal and breath sounds normal. No respiratory distress. She has no wheezes. She has no rales. She exhibits no tenderness.  Deferred breast exam due to normal mammogram and patient does self breast checks  Abdominal: Soft. Bowel sounds are normal.  She exhibits no distension and no mass. There is no tenderness. There is no rebound and no guarding.  Musculoskeletal: Normal range of motion. She exhibits no edema or tenderness.  Lymphadenopathy:    She has no cervical adenopathy.  Neurological: She is alert and oriented to person, place, and time.  Skin: Skin is warm and dry. Petechiae (on forearms, backs of hands, fronts of lower legs (knee down) and tops of feet; fading) noted. No rash noted. She is not diaphoretic.  Psychiatric: She has a normal mood and affect. Her behavior is normal. Judgment and thought content normal.  Vitals reviewed.    Depression Screen PHQ 2/9 Scores 04/12/2017  PHQ - 2 Score  0  PHQ- 9 Score 0      Assessment & Plan:     Routine Health Maintenance and Physical Exam  Exercise Activities and Dietary recommendations Goals    None      Immunization History  Administered Date(s) Administered  . Tdap 10/27/2008    Health Maintenance  Topic Date Due  . Hepatitis C Screening  [birth year]  . HIV Screening  01/04/1979  . PAP SMEAR  01/03/1985  . COLONOSCOPY  01/03/2014  . INFLUENZA VACCINE  05/22/2017  . TETANUS/TDAP  10/27/2018  . MAMMOGRAM  11/30/2018     Discussed health benefits of physical activity, and encouraged her to engage in regular exercise appropriate for her age and condition.    1. Annual physical exam Normal physical exam today. Will check labs as below and f/u pending lab results. If labs are stable and WNL she will not need to have these rechecked for one year at her next annual physical exam. She is to call the office in the meantime if she has any acute issue, questions or concerns. - CBC w/Diff/Platelet - Comprehensive Metabolic Panel (CMET) - TSH - Lipid Profile - B12 - Vitamin D (25 hydroxy) - HgB A1c  2. Essential hypertension Stable. Continue HCTZ 12.5mg . Will check labs as below and f/u pending results. - CBC w/Diff/Platelet - Comprehensive Metabolic Panel  (CMET) - TSH - Lipid Profile - B12 - Vitamin D (25 hydroxy) - HgB A1c  3. Avitaminosis D On Vit D3 1000 IU daily. Will check labs as below and f/u pending results. - CBC w/Diff/Platelet - Comprehensive Metabolic Panel (CMET) - TSH - Lipid Profile - B12 - Vitamin D (25 hydroxy) - HgB A1c  4. BMI 38.0-38.9,adult Discussed in great detail weight loss and healthy dieting habits. She is going to start a food diary and limit to 1200 calories daily. She is to continue her exercise 30-60 minutes 4-5 days per week.  - CBC w/Diff/Platelet - Comprehensive Metabolic Panel (CMET) - TSH - Lipid Profile - B12 - Vitamin D (25 hydroxy) - HgB A1c  5. B12 deficiency On daily supplementation. Will check labs as below and f/u pending results. - CBC w/Diff/Platelet - Comprehensive Metabolic Panel (CMET) - TSH - Lipid Profile - B12 - Vitamin D (25 hydroxy) - HgB A1c  6. Petechial rash Currently fading. No new rash. Has been using sunscreen and protective clothing. Will continue to monitor. May do trial off HCTZ to see if this is trigger.       Margaretann Loveless, PA-C  [facility]

## 2017-04-12 NOTE — Patient Instructions (Signed)
Calorie Counting for Weight Loss Calories are units of energy. Your body needs a certain amount of calories from food to keep you going throughout the day. When you eat more calories than your body needs, your body stores the extra calories as fat. When you eat fewer calories than your body needs, your body burns fat to get the energy it needs. Calorie counting means keeping track of how many calories you eat and drink each day. Calorie counting can be helpful if you need to lose weight. If you make sure to eat fewer calories than your body needs, you should lose weight. Ask your health care provider what a healthy weight is for you. For calorie counting to work, you will need to eat the right number of calories in a day in order to lose a healthy amount of weight per week. A dietitian can help you determine how many calories you need in a day and will give you suggestions on how to reach your calorie goal.  A healthy amount of weight to lose per week is usually 1-2 lb (0.5-0.9 kg). This usually means that your daily calorie intake should be reduced by 500-750 calories.  Eating 1,200 - 1,500 calories per day can help most women lose weight.  Eating 1,500 - 1,800 calories per day can help most men lose weight.  What is my plan? My goal is to have 1200 calories per day. If I have this many calories per day, I should lose around 1-2 pounds per week. What do I need to know about calorie counting? In order to meet your daily calorie goal, you will need to:  Find out how many calories are in each food you would like to eat. Try to do this before you eat.  Decide how much of the food you plan to eat.  Write down what you ate and how many calories it had. Doing this is called keeping a food log.  To successfully lose weight, it is important to balance calorie counting with a healthy lifestyle that includes regular activity. Aim for 150 minutes of moderate exercise (such as walking) or 75 minutes of  vigorous exercise (such as running) each week. Where do I find calorie information?  The number of calories in a food can be found on a Nutrition Facts label. If a food does not have a Nutrition Facts label, try to look up the calories online or ask your dietitian for help. Remember that calories are listed per serving. If you choose to have more than one serving of a food, you will have to multiply the calories per serving by the amount of servings you plan to eat. For example, the label on a package of bread might say that a serving size is 1 slice and that there are 90 calories in a serving. If you eat 1 slice, you will have eaten 90 calories. If you eat 2 slices, you will have eaten 180 calories. How do I keep a food log? Immediately after each meal, record the following information in your food log:  What you ate. Don't forget to include toppings, sauces, and other extras on the food.  How much you ate. This can be measured in cups, ounces, or number of items.  How many calories each food and drink had.  The total number of calories in the meal.  Keep your food log near you, such as in a small notebook in your pocket, or use a mobile app or website. Some  programs will calculate calories for you and show you how many calories you have left for the day to meet your goal. What are some calorie counting tips?  Use your calories on foods and drinks that will fill you up and not leave you hungry: ? Some examples of foods that fill you up are nuts and nut butters, vegetables, lean proteins, and high-fiber foods like whole grains. High-fiber foods are foods with more than 5 g fiber per serving. ? Drinks such as sodas, specialty coffee drinks, alcohol, and juices have a lot of calories, yet do not fill you up.  Eat nutritious foods and avoid empty calories. Empty calories are calories you get from foods or beverages that do not have many vitamins or protein, such as candy, sweets, and soda. It is  better to have a nutritious high-calorie food (such as an avocado) than a food with few nutrients (such as a bag of chips).  Know how many calories are in the foods you eat most often. This will help you calculate calorie counts faster.  Pay attention to calories in drinks. Low-calorie drinks include water and unsweetened drinks.  Pay attention to nutrition labels for "low fat" or "fat free" foods. These foods sometimes have the same amount of calories or more calories than the full fat versions. They also often have added sugar, starch, or salt, to make up for flavor that was removed with the fat.  Find a way of tracking calories that works for you. Get creative. Try different apps or programs if writing down calories does not work for you. What are some portion control tips?  Know how many calories are in a serving. This will help you know how many servings of a certain food you can have.  Use a measuring cup to measure serving sizes. You could also try weighing out portions on a kitchen scale. With time, you will be able to estimate serving sizes for some foods.  Take some time to put servings of different foods on your favorite plates, bowls, and cups so you know what a serving looks like.  Try not to eat straight from a bag or box. Doing this can lead to overeating. Put the amount you would like to eat in a cup or on a plate to make sure you are eating the right portion.  Use smaller plates, glasses, and bowls to prevent overeating.  Try not to multitask (for example, watch TV or use your computer) while eating. If it is time to eat, sit down at a table and enjoy your food. This will help you to know when you are full. It will also help you to be aware of what you are eating and how much you are eating. What are tips for following this plan? Reading food labels  Check the calorie count compared to the serving size. The serving size may be smaller than what you are used to  eating.  Check the source of the calories. Make sure the food you are eating is high in vitamins and protein and low in saturated and trans fats. Shopping  Read nutrition labels while you shop. This will help you make healthy decisions before you decide to purchase your food.  Make a grocery list and stick to it. Cooking  Try to cook your favorite foods in a healthier way. For example, try baking instead of frying.  Use low-fat dairy products. Meal planning  Use more fruits and vegetables. Half of your plate should  be fruits and vegetables.  Include lean proteins like poultry and fish. How do I count calories when eating out?  Ask for smaller portion sizes.  Consider sharing an entree and sides instead of getting your own entree.  If you get your own entree, eat only half. Ask for a box at the beginning of your meal and put the rest of your entree in it so you are not tempted to eat it.  If calories are listed on the menu, choose the lower calorie options.  Choose dishes that include vegetables, fruits, whole grains, low-fat dairy products, and lean protein.  Choose items that are boiled, broiled, grilled, or steamed. Stay away from items that are buttered, battered, fried, or served with cream sauce. Items labeled "crispy" are usually fried, unless stated otherwise.  Choose water, low-fat milk, unsweetened iced tea, or other drinks without added sugar. If you want an alcoholic beverage, choose a lower calorie option such as a glass of wine or light beer.  Ask for dressings, sauces, and syrups on the side. These are usually high in calories, so you should limit the amount you eat.  If you want a salad, choose a garden salad and ask for grilled meats. Avoid extra toppings like bacon, cheese, or fried items. Ask for the dressing on the side, or ask for olive oil and vinegar or lemon to use as dressing.  Estimate how many servings of a food you are given. For example, a serving of  cooked rice is  cup or about the size of half a baseball. Knowing serving sizes will help you be aware of how much food you are eating at restaurants. The list below tells you how big or small some common portion sizes are based on everyday objects: ? 1 oz-4 stacked dice. ? 3 oz-1 deck of cards. ? 1 tsp-1 die. ? 1 Tbsp- a ping-pong ball. ? 2 Tbsp-1 ping-pong ball. ?  cup- baseball. ? 1 cup-1 baseball. Summary  Calorie counting means keeping track of how many calories you eat and drink each day. If you eat fewer calories than your body needs, you should lose weight.  A healthy amount of weight to lose per week is usually 1-2 lb (0.5-0.9 kg). This usually means reducing your daily calorie intake by 500-750 calories.  The number of calories in a food can be found on a Nutrition Facts label. If a food does not have a Nutrition Facts label, try to look up the calories online or ask your dietitian for help.  Use your calories on foods and drinks that will fill you up, and not on foods and drinks that will leave you hungry.  Use smaller plates, glasses, and bowls to prevent overeating. This information is not intended to replace advice given to you by your health care provider. Make sure you discuss any questions you have with your health care provider. Document Released: 10/08/2005 Document Revised: 09/07/2016 Document Reviewed: 09/07/2016 Elsevier Interactive Patient Education  2017 [employer] Maintenance for Postmenopausal Women Menopause is a normal process in which your reproductive ability comes to an end. This process happens gradually over a span of months to years, usually between the ages of 68 and 43. Menopause is complete when you have missed 12 consecutive menstrual periods. It is important to talk with your health care provider about some of the most common conditions that affect postmenopausal women, such as heart disease, cancer, and bone loss (osteoporosis). Adopting  a healthy lifestyle and getting  preventive care can help to promote your health and wellness. Those actions can also lower your chances of developing some of these common conditions. What should I know about menopause? During menopause, you may experience a number of symptoms, such as:  Moderate-to-severe hot flashes.  Night sweats.  Decrease in sex drive.  Mood swings.  Headaches.  Tiredness.  Irritability.  Memory problems.  Insomnia.  Choosing to treat or not to treat menopausal changes is an individual decision that you make with your health care provider. What should I know about hormone replacement therapy and supplements? Hormone therapy products are effective for treating symptoms that are associated with menopause, such as hot flashes and night sweats. Hormone replacement carries certain risks, especially as you become older. If you are thinking about using estrogen or estrogen with progestin treatments, discuss the benefits and risks with your health care provider. What should I know about heart disease and stroke? Heart disease, heart attack, and stroke become more likely as you age. This may be due, in part, to the hormonal changes that your body experiences during menopause. These can affect how your body processes dietary fats, triglycerides, and cholesterol. Heart attack and stroke are both medical emergencies. There are many things that you can do to help prevent heart disease and stroke:  Have your blood pressure checked at least every 1-2 years. High blood pressure causes heart disease and increases the risk of stroke.  If you are 37-45 years old, ask your health care provider if you should take aspirin to prevent a heart attack or a stroke.  Do not use any tobacco products, including cigarettes, chewing tobacco, or electronic cigarettes. If you need help quitting, ask your health care provider.  It is important to eat a healthy diet and maintain a healthy  weight. ? Be sure to include plenty of vegetables, fruits, low-fat dairy products, and lean protein. ? Avoid eating foods that are high in solid fats, added sugars, or salt (sodium).  Get regular exercise. This is one of the most important things that you can do for your health. ? Try to exercise for at least 150 minutes each week. The type of exercise that you do should increase your heart rate and make you sweat. This is known as moderate-intensity exercise. ? Try to do strengthening exercises at least twice each week. Do these in addition to the moderate-intensity exercise.  Know your numbers.Ask your health care provider to check your cholesterol and your blood glucose. Continue to have your blood tested as directed by your health care provider.  What should I know about cancer screening? There are several types of cancer. Take the following steps to reduce your risk and to catch any cancer development as early as possible. Breast Cancer  Practice breast self-awareness. ? This means understanding how your breasts normally appear and feel. ? It also means doing regular breast self-exams. Let your health care provider know about any changes, no matter how small.  If you are 71 or older, have a clinician do a breast exam (clinical breast exam or CBE) every year. Depending on your age, family history, and medical history, it may be recommended that you also have a yearly breast X-ray (mammogram).  If you have a family history of breast cancer, talk with your health care provider about genetic screening.  If you are at high risk for breast cancer, talk with your health care provider about having an MRI and a mammogram every year.  Breast cancer (  BRCA) gene test is recommended for women who have family members with BRCA-related cancers. Results of the assessment will determine the need for genetic counseling and BRCA1 and for BRCA2 testing. BRCA-related cancers include these types: ? Breast.  This occurs in males or females. ? Ovarian. ? Tubal. This may also be called fallopian tube cancer. ? Cancer of the abdominal or pelvic lining (peritoneal cancer). ? Prostate. ? Pancreatic.  Cervical, Uterine, and Ovarian Cancer Your health care provider may recommend that you be screened regularly for cancer of the pelvic organs. These include your ovaries, uterus, and vagina. This screening involves a pelvic exam, which includes checking for microscopic changes to the surface of your cervix (Pap test).  For women ages 21-65, health care providers may recommend a pelvic exam and a Pap test every three years. For women ages 82-65, they may recommend the Pap test and pelvic exam, combined with testing for human papilloma virus (HPV), every five years. Some types of HPV increase your risk of cervical cancer. Testing for HPV may also be done on women of any age who have unclear Pap test results.  Other health care providers may not recommend any screening for nonpregnant women who are considered low risk for pelvic cancer and have no symptoms. Ask your health care provider if a screening pelvic exam is right for you.  If you have had past treatment for cervical cancer or a condition that could lead to cancer, you need Pap tests and screening for cancer for at least 20 years after your treatment. If Pap tests have been discontinued for you, your risk factors (such as having a new sexual partner) need to be reassessed to determine if you should start having screenings again. Some women have medical problems that increase the chance of getting cervical cancer. In these cases, your health care provider may recommend that you have screening and Pap tests more often.  If you have a family history of uterine cancer or ovarian cancer, talk with your health care provider about genetic screening.  If you have vaginal bleeding after reaching menopause, tell your health care provider.  There are currently no  reliable tests available to screen for ovarian cancer.  Lung Cancer Lung cancer screening is recommended for adults 68-54 years old who are at high risk for lung cancer because of a history of smoking. A yearly low-dose CT scan of the lungs is recommended if you:  Currently smoke.  Have a history of at least 30 pack-years of smoking and you currently smoke or have quit within the past 15 years. A pack-year is smoking an average of one pack of cigarettes per day for one year.  Yearly screening should:  Continue until it has been 15 years since you quit.  Stop if you develop a health problem that would prevent you from having lung cancer treatment.  Colorectal Cancer  This type of cancer can be detected and can often be prevented.  Routine colorectal cancer screening usually begins at age 65 and continues through age 72.  If you have risk factors for colon cancer, your health care provider may recommend that you be screened at an earlier age.  If you have a family history of colorectal cancer, talk with your health care provider about genetic screening.  Your health care provider may also recommend using home test kits to check for hidden blood in your stool.  A small camera at the end of a tube can be used to examine your  colon directly (sigmoidoscopy or colonoscopy). This is done to check for the earliest forms of colorectal cancer.  Direct examination of the colon should be repeated every 5-10 years until age 59. However, if early forms of precancerous polyps or small growths are found or if you have a family history or genetic risk for colorectal cancer, you may need to be screened more often.  Skin Cancer  Check your skin from head to toe regularly.  Monitor any moles. Be sure to tell your health care provider: ? About any new moles or changes in moles, especially if there is a change in a mole's shape or color. ? If you have a mole that is larger than the size of a pencil  eraser.  If any of your family members has a history of skin cancer, especially at a young age, talk with your health care provider about genetic screening.  Always use sunscreen. Apply sunscreen liberally and repeatedly throughout the day.  Whenever you are outside, protect yourself by wearing long sleeves, pants, a wide-brimmed hat, and sunglasses.  What should I know about osteoporosis? Osteoporosis is a condition in which bone destruction happens more quickly than new bone creation. After menopause, you may be at an increased risk for osteoporosis. To help prevent osteoporosis or the bone fractures that can happen because of osteoporosis, the following is recommended:  If you are 58-57 years old, get at least 1,000 mg of calcium and at least 600 mg of vitamin D per day.  If you are older than age 37 but younger than age 56, get at least 1,200 mg of calcium and at least 600 mg of vitamin D per day.  If you are older than age 27, get at least 1,200 mg of calcium and at least 800 mg of vitamin D per day.  Smoking and excessive alcohol intake increase the risk of osteoporosis. Eat foods that are rich in calcium and vitamin D, and do weight-bearing exercises several times each week as directed by your health care provider. What should I know about how menopause affects my mental health? Depression may occur at any age, but it is more common as you become older. Common symptoms of depression include:  Low or sad mood.  Changes in sleep patterns.  Changes in appetite or eating patterns.  Feeling an overall lack of motivation or enjoyment of activities that you previously enjoyed.  Frequent crying spells.  Talk with your health care provider if you think that you are experiencing depression. What should I know about immunizations? It is important that you get and maintain your immunizations. These include:  Tetanus, diphtheria, and pertussis (Tdap) booster vaccine.  Influenza every  year before the flu season begins.  Pneumonia vaccine.  Shingles vaccine.  Your health care provider may also recommend other immunizations. This information is not intended to replace advice given to you by your health care provider. Make sure you discuss any questions you have with your health care provider. Document Released: 11/30/2005 Document Revised: 04/27/2016 Document Reviewed: 07/12/2015 Elsevier Interactive Patient Education  2018 [employer].

## 2017-04-13 LAB — CBC WITH DIFFERENTIAL/PLATELET
BASOS ABS: 0 10*3/uL (ref 0.0–0.2)
Basos: 0 %
EOS (ABSOLUTE): 0.1 10*3/uL (ref 0.0–0.4)
EOS: 1 %
Hematocrit: 37.8 % (ref 34.0–46.6)
Hemoglobin: 12.7 g/dL (ref 11.1–15.9)
IMMATURE GRANULOCYTES: 0 %
Immature Grans (Abs): 0 10*3/uL (ref 0.0–0.1)
Lymphocytes Absolute: 1.4 10*3/uL (ref 0.7–3.1)
Lymphs: 29 %
MCH: 31.9 pg (ref 26.6–33.0)
MCHC: 33.6 g/dL (ref 31.5–35.7)
MCV: 95 fL (ref 79–97)
MONOS ABS: 0.3 10*3/uL (ref 0.1–0.9)
Monocytes: 5 %
NEUTROS PCT: 65 %
Neutrophils Absolute: 3.2 10*3/uL (ref 1.4–7.0)
PLATELETS: 252 10*3/uL (ref 150–379)
RBC: 3.98 x10E6/uL (ref 3.77–5.28)
RDW: 13 % (ref 12.3–15.4)
WBC: 5 10*3/uL (ref 3.4–10.8)

## 2017-04-13 LAB — TSH: TSH: 1.04 u[IU]/mL (ref 0.450–4.500)

## 2017-04-13 LAB — COMPREHENSIVE METABOLIC PANEL
ALT: 20 IU/L (ref 0–32)
AST: 18 IU/L (ref 0–40)
Albumin/Globulin Ratio: 1.6 (ref 1.2–2.2)
Albumin: 4.5 g/dL (ref 3.5–5.5)
Alkaline Phosphatase: 81 IU/L (ref 39–117)
BUN/Creatinine Ratio: 13 (ref 9–23)
BUN: 10 mg/dL (ref 6–24)
Bilirubin Total: 0.8 mg/dL (ref 0.0–1.2)
CALCIUM: 9.4 mg/dL (ref 8.7–10.2)
CO2: 23 mmol/L (ref 20–29)
CREATININE: 0.75 mg/dL (ref 0.57–1.00)
Chloride: 104 mmol/L (ref 96–106)
GFR calc Af Amer: 105 mL/min/{1.73_m2} (ref >59)
GFR, EST NON AFRICAN AMERICAN: 91 mL/min/{1.73_m2} (ref >59)
GLUCOSE: 86 mg/dL (ref 65–99)
Globulin, Total: 2.8 g/dL (ref 1.5–4.5)
POTASSIUM: 4.9 mmol/L (ref 3.5–5.2)
Sodium: 141 mmol/L (ref 134–144)
Total Protein: 7.3 g/dL (ref 6.0–8.5)

## 2017-04-13 LAB — LIPID PANEL
CHOL/HDL RATIO: 3.1 ratio (ref 0.0–4.4)
CHOLESTEROL TOTAL: 155 mg/dL (ref 100–199)
HDL: 50 mg/dL (ref >39)
LDL CALC: 90 mg/dL (ref 0–99)
TRIGLYCERIDES: 75 mg/dL (ref 0–149)
VLDL Cholesterol Cal: 15 mg/dL (ref 5–40)

## 2017-04-13 LAB — HEMOGLOBIN A1C
ESTIMATED AVERAGE GLUCOSE: 94 mg/dL
Hgb A1c MFr Bld: 4.9 % (ref 4.8–5.6)

## 2017-04-13 LAB — VITAMIN B12: Vitamin B-12: >2000 pg/mL — ABNORMAL HIGH (ref 232–1245)

## 2017-04-13 LAB — VITAMIN D 25 HYDROXY (VIT D DEFICIENCY, FRACTURES): Vit D, 25-Hydroxy: 49.7 ng/mL (ref 30.0–100.0)

## 2017-04-15 NOTE — Progress Notes (Signed)
Advised  ED 

## 2017-04-26 ENCOUNTER — Encounter: Payer: Self-pay | Admitting: Physician Assistant

## 2017-04-26 ENCOUNTER — Ambulatory Visit (INDEPENDENT_AMBULATORY_CARE_PROVIDER_SITE_OTHER): Payer: 59 | Admitting: Physician Assistant

## 2017-04-26 VITALS — BP 115/72 | HR 93 | Temp 98.1°F | Resp 16 | Wt 227.2 lb

## 2017-04-26 DIAGNOSIS — I10 Essential (primary) hypertension: Secondary | ICD-10-CM

## 2017-04-26 DIAGNOSIS — R21 Rash and other nonspecific skin eruption: Secondary | ICD-10-CM

## 2017-04-26 MED ORDER — CHLORTHALIDONE 25 MG PO TABS: 25.0000 mg | ORAL_TABLET | Freq: Every day | ORAL | 0 refills | Status: DC

## 2017-04-26 MED ORDER — TRIAMCINOLONE ACETONIDE 0.1 % EX CREA: 1.0000 "application " | TOPICAL_CREAM | Freq: Two times a day (BID) | CUTANEOUS | 0 refills | Status: DC

## 2017-04-26 NOTE — Patient Instructions (Signed)
Chlorthalidone tablets What is this medicine? CHLORTHALIDONE (klor THAL i done) is a diuretic. It increases the amount of urine passed, which causes the body to lose salt and water. This medicine is used to treat high blood pressure and edema or water retention. This medicine may be used for other purposes; ask your health care provider or pharmacist if you have questions. COMMON BRAND NAME(S): Thalitone What should I tell my health care provider before I take this medicine? They need to know if you have any of these conditions: -asthma -diabetes -gout -kidney disease -liver disease -parathyroid disease -systemic lupus erythematosus (SLE) -taking cortisone, digoxin, lithium carbonate, or drugs for diabetes -an unusual or allergic reaction to chlorthalidone, sulfa drugs, other medicines, foods, dyes, or preservatives -pregnant or trying to get pregnant -breast-feeding How should I use this medicine? Take this medicine by mouth with a glass of water. Follow the directions on the prescription label. It is best to take your dose in the morning with food. Take your medicine at regular intervals. Do not take your medicine more often than directed. Do not stop taking except on your doctor's advice. Talk to your pediatrician regarding the use of this medicine in children. Special care may be needed. Overdosage: If you think you have taken too much of this medicine contact a poison control center or emergency room at once. NOTE: This medicine is only for you. Do not share this medicine with others. What if I miss a dose? If you miss a dose, take it as soon as you can. If it is almost time for your next dose, take only that dose. Do not take double or extra doses. What may interact with this medicine? -barbiturate medicines for sleep or seizure control -digoxin -lithium -medicines for diabetes -norepinephrine -other medicines for high blood pressure -some pain medicines -steroid hormones like  prednisone, cortisone, hydrocortisone, corticotropin -tubocurarine This list may not describe all possible interactions. Give your health care provider a list of all the medicines, herbs, non-prescription drugs, or dietary supplements you use. Also tell them if you smoke, drink alcohol, or use illegal drugs. Some items may interact with your medicine. What should I watch for while using this medicine? Visit your doctor or health care professional for regular check ups. Check your blood pressure as directed. Ask your doctor or health care professional what your blood pressure should be and when you should contact him or her. You may need to be on a special diet while taking this medicine. Ask your doctor. You may get drowsy or dizzy. Do not drive, use machinery, or do anything that needs mental alertness until you know how this medicine affects you. Do not stand or sit up quickly, especially if you are an older patient. This reduces the risk of dizzy or fainting spells. Alcohol may interfere with the effect of this medicine. Avoid alcoholic drinks. This medicine may affect your blood sugar level. If you have diabetes, check with your doctor or health care professional before changing the dose of your diabetic medicine. This medicine can make you more sensitive to the sun. Keep out of the sun. If you cannot avoid being in the sun, wear protective clothing and use sunscreen. Do not use sun lamps or tanning beds/booths. What side effects may I notice from receiving this medicine? Side effects that you should report to your doctor or health care professional as soon as possible: -allergic reactions like skin rash, itching or hives, swelling of the face, lips, or tongue -dark  urine -dry mouth -excess thirst -fast, irregular heart rate -fever, chills -muscle pain, cramps, or spasm -nausea, vomiting -redness, blistering, peeling or loosening of the skin, including inside the mouth -tingling, pain or  numbness in the hands or feet -unusually weak or tired -yellowing of the eyes or skin Side effects that usually do not require medical attention (report to your doctor or health care professional if they continue or are bothersome): -diarrhea or constipation -headache -impotence -loss of appetite -stomach upset This list may not describe all possible side effects. Call your doctor for medical advice about side effects. You may report side effects to FDA at 1-800-FDA-1088. Where should I keep my medicine? Keep out of the reach of children. Store at room temperature between 15 and 30 degrees C (59 and 86 degrees F). Keep container tightly closed. Throw away any unused medicine after the expiration date. NOTE: This sheet is a summary. It may not cover all possible information. If you have questions about this medicine, talk to your doctor, pharmacist, or health care provider.  2018 Elsevier/Gold Standard (2008-01-13 15:28:48)

## 2017-04-26 NOTE — Progress Notes (Signed)
Patient: Ashley NavyFelicia B Rascon Female    DOB: 1965-12-14   51 y.o.   MRN: [id number] Visit Date: 04/27/2017  Today's Provider: Margaretann LovelessJennifer M Burnette, PA-C   Chief Complaint  Patient presents with  . Follow-up    Rash   Subjective:    HPI Patient comes in today for a follow-up. She is was diagnosed on 06/08 with Petechial rash. She reports that she stopped the hydrochlorothiazide medication for a few days and the petechial rash started to fade away. She re-started the Hydrochlorpthiazide and she has the petechia again.     Allergies  Allergen Reactions  . Lisinopril Itching    Mouth Itching     Current Outpatient Prescriptions:  .  calcium carbonate (OS-CAL) 600 MG TABS tablet, Take 1 tablet by mouth daily., Disp: , Rfl:  .  cholecalciferol (VITAMIN D) 1000 UNITS tablet, Take 5 tablets by mouth daily., Disp: , Rfl:  .  cyanocobalamin 1000 MCG tablet, Take 1,000 mcg by mouth daily., Disp: , Rfl:  .  cyclobenzaprine (FLEXERIL) 5 MG tablet, Take 1 tablet (5 mg total) by mouth at bedtime., Disp: 30 tablet, Rfl: 5 .  fluticasone (FLONASE) 50 MCG/ACT nasal spray, Place 2 sprays into both nostrils daily., Disp: 16 g, Rfl: 11 .  ibuprofen (ADVIL,MOTRIN) 800 MG tablet, Take 1 tablet by mouth  every 8 hours as needed, Disp: 180 tablet, Rfl: 1 .  loratadine (CLARITIN REDITABS) 10 MG dissolvable tablet, Take 1 tablet by mouth daily., Disp: , Rfl:  .  montelukast (SINGULAIR) 10 MG tablet, Take 1 tablet (10 mg total) by mouth at bedtime., Disp: 90 tablet, Rfl: 3 .  chlorthalidone (HYGROTON) 25 MG tablet, Take 1 tablet (25 mg total) by mouth daily., Disp: 30 tablet, Rfl: 0 .  triamcinolone cream (KENALOG) 0.1 %, Apply 1 application topically 2 (two) times daily., Disp: 30 g, Rfl: 0  Review of Systems  Constitutional: Negative.   Respiratory: Negative.   Cardiovascular: Negative.   Skin: Positive for rash (on top of feet , forearms, and hands.).  Neurological: Negative.     Social History    Substance Use Topics  . Smoking status: Never Smoker  . Smokeless tobacco: Never Used  . Alcohol use No   Objective:   BP 115/72 (BP Location: Left Arm, Patient Position: Sitting, Cuff Size: Normal)   Pulse 93   Temp 98.1 F (36.7 C) (Oral)   Resp 16   Wt 227 lb 3.2 oz (103.1 kg)   BMI 37.81 kg/m    Physical Exam  Constitutional: She appears well-developed and well-nourished. No distress.  Neck: Normal range of motion. Neck supple.  Cardiovascular: Normal rate, regular rhythm and normal heart sounds.  Exam reveals no gallop and no friction rub.   No murmur heard. Pulmonary/Chest: Effort normal and breath sounds normal. No respiratory distress. She has no wheezes. She has no rales.  Skin: Petechiae (noted on back of hands, forearms, tops of feet, and anterior legs) noted. She is not diaphoretic.  Vitals reviewed.       Assessment & Plan:     1. Essential hypertension Will stop HCTZ 12.5mg  and try chlorthalidone as below. Advised patient it may also cause the petechial rash since it is also a thiazide diuretic. She wished to try this first and may try triamterene or spironolactone next. She does not want to use another class of antihypertensives just yet as she likes the effect the fluid pill gives with decreasing her ankle  swelling as well. She is to call if the petechial rash does not improve. I will see her back in 4 weeks.  - chlorthalidone (HYGROTON) 25 MG tablet; Take 1 tablet (25 mg total) by mouth daily.  Dispense: 30 tablet; Refill: 0  2. Rash For itching. - triamcinolone cream (KENALOG) 0.1 %; Apply 1 application topically 2 (two) times daily.  Dispense: 30 g; Refill: 0       Margaretann Loveless, PA-C  [facility]

## 2017-05-13 ENCOUNTER — Other Ambulatory Visit: Payer: Self-pay | Admitting: Physician Assistant

## 2017-05-13 DIAGNOSIS — J328 Other chronic sinusitis: Secondary | ICD-10-CM

## 2017-05-13 MED ORDER — FLUTICASONE PROPIONATE 50 MCG/ACT NA SUSP: 2.0000 | Freq: Every day | NASAL | 11 refills | Status: DC

## 2017-05-13 NOTE — Telephone Encounter (Signed)
[facility] faxed a request on the following medication. Thanks CC ° °fluticasone (FLONASE) 50 MCG/ACT nasal spray  °>Place 2 sprays into both nostrils daily.  °

## 2017-05-24 ENCOUNTER — Encounter: Payer: Self-pay | Admitting: Physician Assistant

## 2017-05-24 ENCOUNTER — Ambulatory Visit (INDEPENDENT_AMBULATORY_CARE_PROVIDER_SITE_OTHER): Payer: 59 | Admitting: Physician Assistant

## 2017-05-24 VITALS — BP 140/88 | HR 79 | Temp 97.5°F | Resp 16 | Wt 228.0 lb

## 2017-05-24 DIAGNOSIS — G8929 Other chronic pain: Secondary | ICD-10-CM | POA: Diagnosis not present

## 2017-05-24 DIAGNOSIS — I10 Essential (primary) hypertension: Secondary | ICD-10-CM

## 2017-05-24 DIAGNOSIS — R233 Spontaneous ecchymoses: Secondary | ICD-10-CM

## 2017-05-24 DIAGNOSIS — M5440 Lumbago with sciatica, unspecified side: Secondary | ICD-10-CM | POA: Diagnosis not present

## 2017-05-24 MED ORDER — TRIAMCINOLONE ACETONIDE 0.1 % EX CREA: 1.0000 "application " | TOPICAL_CREAM | Freq: Two times a day (BID) | CUTANEOUS | 0 refills | Status: DC

## 2017-05-24 MED ORDER — CHLORTHALIDONE 25 MG PO TABS: 25.0000 mg | ORAL_TABLET | Freq: Every day | ORAL | 0 refills | Status: DC

## 2017-05-24 MED ORDER — AMLODIPINE BESYLATE 5 MG PO TABS: 5.0000 mg | ORAL_TABLET | Freq: Every day | ORAL | 0 refills | Status: DC

## 2017-05-24 MED ORDER — IBUPROFEN 800 MG PO TABS: 800.0000 mg | ORAL_TABLET | Freq: Three times a day (TID) | ORAL | 1 refills | Status: DC | PRN

## 2017-05-24 MED ORDER — CYCLOBENZAPRINE HCL 5 MG PO TABS: 5.0000 mg | ORAL_TABLET | Freq: Every day | ORAL | 1 refills | Status: DC

## 2017-05-24 NOTE — Patient Instructions (Signed)
Amlodipine tablets °What is this medicine? °AMLODIPINE (am LOE di peen) is a calcium-channel blocker. It affects the amount of calcium found in your heart and muscle cells. This relaxes your blood vessels, which can reduce the amount of work the heart has to do. This medicine is used to lower high blood pressure. It is also used to prevent chest pain. °This medicine may be used for other purposes; ask your health care provider or pharmacist if you have questions. °COMMON BRAND NAME(S): Norvasc °What should I tell my health care provider before I take this medicine? °They need to know if you have any of these conditions: °-heart problems like heart failure or aortic stenosis °-liver disease °-an unusual or allergic reaction to amlodipine, other medicines, foods, dyes, or preservatives °-pregnant or trying to get pregnant °-breast-feeding °How should I use this medicine? °Take this medicine by mouth with a glass of water. Follow the directions on the prescription label. Take your medicine at regular intervals. Do not take more medicine than directed. °Talk to your pediatrician regarding the use of this medicine in children. Special care may be needed. This medicine has been used in children as young as 6. °Persons over 65 years old may have a stronger reaction to this medicine and need smaller doses. °Overdosage: If you think you have taken too much of this medicine contact a poison control center or emergency room at once. °NOTE: This medicine is only for you. Do not share this medicine with others. °What if I miss a dose? °If you miss a dose, take it as soon as you can. If it is almost time for your next dose, take only that dose. Do not take double or extra doses. °What may interact with this medicine? °-herbal or dietary supplements °-local or general anesthetics °-medicines for high blood pressure °-medicines for prostate problems °-rifampin °This list may not describe all possible interactions. Give your health  care provider a list of all the medicines, herbs, non-prescription drugs, or dietary supplements you use. Also tell them if you smoke, drink alcohol, or use illegal drugs. Some items may interact with your medicine. °What should I watch for while using this medicine? °Visit your doctor or health care professional for regular check ups. Check your blood pressure and pulse rate regularly. Ask your health care professional what your blood pressure and pulse rate should be, and when you should contact him or her. °This medicine may make you feel confused, dizzy or lightheaded. Do not drive, use machinery, or do anything that needs mental alertness until you know how this medicine affects you. To reduce the risk of dizzy or fainting spells, do not sit or stand up quickly, especially if you are an older patient. Avoid alcoholic drinks; they can make you more dizzy. °Do not suddenly stop taking amlodipine. Ask your doctor or health care professional how you can gradually reduce the dose. °What side effects may I notice from receiving this medicine? °Side effects that you should report to your doctor or health care professional as soon as possible: °-allergic reactions like skin rash, itching or hives, swelling of the face, lips, or tongue °-breathing problems °-changes in vision or hearing °-chest pain °-fast, irregular heartbeat °-swelling of legs or ankles °Side effects that usually do not require medical attention (report to your doctor or health care professional if they continue or are bothersome): °-dry mouth °-facial flushing °-nausea, vomiting °-stomach gas, pain °-tired, weak °-trouble sleeping °This list may not describe all possible side   effects. Call your doctor for medical advice about side effects. You may report side effects to FDA at 1-800-FDA-1088. °Where should I keep my medicine? °Keep out of the reach of children. °Store at room temperature between 59 and 86 degrees F (15 and 30 degrees C). Protect from  light. Keep container tightly closed. Throw away any unused medicine after the expiration date. °NOTE: This sheet is a summary. It may not cover all possible information. If you have questions about this medicine, talk to your doctor, pharmacist, or health care provider. °© 2018 Elsevier/Gold Standard (2012-09-05 11:40:58) ° °

## 2017-05-24 NOTE — Progress Notes (Signed)
Patient: Ashley Ballard Female    DOB: 12/06/1965   51 y.o.   MRN: [id number] Visit Date: 05/24/2017  Today's Provider: Margaretann LovelessJennifer M Shaleah Nissley, PA-C   Chief Complaint  Patient presents with  . Follow-up    Rash   Subjective:    HPI   Petechial Rash Follow Visit:  Patient is here for petechial rash follow up.  She was seen for a follow-up on the same rash 4 weeks ago. Treatment for this included HCTZ was discontinue and was prescribed Chlorthalidone and was advised that it cause petechial rash since it also a thiazide diuretic. Triamcinolone cream  For itching. She reports excellent compliance with treatment. She reports this condition is Improved. ------------------------------------------------------------------------------------ Patient is requesting refills on Kenalog cream, Ibuprofen 800mg  and Flexeril.      Allergies  Allergen Reactions  . Lisinopril Itching    Mouth Itching     Current Outpatient Prescriptions:  .  calcium carbonate (OS-CAL) 600 MG TABS tablet, Take 1 tablet by mouth daily., Disp: , Rfl:  .  chlorthalidone (HYGROTON) 25 MG tablet, Take 1 tablet (25 mg total) by mouth daily., Disp: 30 tablet, Rfl: 0 .  cholecalciferol (VITAMIN D) 1000 UNITS tablet, Take 5 tablets by mouth daily., Disp: , Rfl:  .  cyclobenzaprine (FLEXERIL) 5 MG tablet, Take 1 tablet (5 mg total) by mouth at bedtime., Disp: 30 tablet, Rfl: 5 .  fluticasone (FLONASE) 50 MCG/ACT nasal spray, Place 2 sprays into both nostrils daily., Disp: 16 g, Rfl: 11 .  ibuprofen (ADVIL,MOTRIN) 800 MG tablet, Take 1 tablet by mouth  every 8 hours as needed, Disp: 180 tablet, Rfl: 1 .  loratadine (CLARITIN REDITABS) 10 MG dissolvable tablet, Take 1 tablet by mouth daily., Disp: , Rfl:  .  montelukast (SINGULAIR) 10 MG tablet, Take 1 tablet (10 mg total) by mouth at bedtime., Disp: 90 tablet, Rfl: 3 .  triamcinolone cream (KENALOG) 0.1 %, Apply 1 application topically 2 (two) times daily., Disp: 30  g, Rfl: 0 .  cyanocobalamin 1000 MCG tablet, Take 1,000 mcg by mouth daily., Disp: , Rfl:   Review of Systems  Constitutional: Negative.   Respiratory: Negative.   Cardiovascular: Negative.   Skin: Negative.   Neurological: Negative.     Social History  Substance Use Topics  . Smoking status: Never Smoker  . Smokeless tobacco: Never Used  . Alcohol use No   Objective:   BP 140/88 (BP Location: Right Arm, Patient Position: Sitting, Cuff Size: Normal)   Pulse 79   Temp (!) 97.5 F (36.4 C) (Oral)   Resp 16   Wt 228 lb (103.4 kg)   BMI 37.94 kg/m    Physical Exam  Constitutional: She appears well-developed and well-nourished. No distress.  Neck: Normal range of motion. Neck supple. No JVD present. No tracheal deviation present. No thyromegaly present.  Cardiovascular: Normal rate, regular rhythm and normal heart sounds.  Exam reveals no gallop and no friction rub.   No murmur heard. Pulmonary/Chest: Effort normal and breath sounds normal. No respiratory distress. She has no wheezes. She has no rales.  Musculoskeletal: She exhibits no edema.  Lymphadenopathy:    She has no cervical adenopathy.  Skin: She is not diaphoretic.  Vitals reviewed.       Assessment & Plan:   1. Essential hypertension Uncontrolled. Will add amlodipine 5mg . Continue chlorthalidone. I will see her back in 4 weeks.   2. Petechial rash Stable. Diagnosis pulled for medication refill.  Continue current medical treatment plan. - triamcinolone cream (KENALOG) 0.1 %; Apply 1 application topically 2 (two) times daily.  Dispense: 30 g; Refill: 0  3. Chronic low back pain with sciatica, sciatica laterality unspecified, unspecified back pain laterality Stable. Diagnosis pulled for medication refill. Continue current medical treatment plan. - cyclobenzaprine (FLEXERIL) 5 MG tablet; Take 1 tablet (5 mg total) by mouth at bedtime.  Dispense: 30 tablet; Refill: 5 - ibuprofen (ADVIL,MOTRIN) 800 MG tablet;  Take 1 tablet (800 mg total) by mouth every 8 (eight) hours as needed.  Dispense: 180 tablet; Refill: 1     Margaretann LovelessJennifer M Sohaib Vereen, PA-C  [facility]

## 2017-06-20 ENCOUNTER — Other Ambulatory Visit: Payer: Self-pay | Admitting: Physician Assistant

## 2017-06-20 DIAGNOSIS — I10 Essential (primary) hypertension: Secondary | ICD-10-CM

## 2017-06-21 ENCOUNTER — Telehealth: Payer: Self-pay | Admitting: Physician Assistant

## 2017-06-21 ENCOUNTER — Encounter: Payer: Self-pay | Admitting: Physician Assistant

## 2017-06-21 ENCOUNTER — Ambulatory Visit (INDEPENDENT_AMBULATORY_CARE_PROVIDER_SITE_OTHER): Payer: 59 | Admitting: Physician Assistant

## 2017-06-21 VITALS — BP 120/80 | HR 80 | Temp 97.5°F | Resp 16 | Ht 65.0 in | Wt 222.0 lb

## 2017-06-21 DIAGNOSIS — I10 Essential (primary) hypertension: Secondary | ICD-10-CM

## 2017-06-21 DIAGNOSIS — Z6836 Body mass index (BMI) 36.0-36.9, adult: Secondary | ICD-10-CM | POA: Diagnosis not present

## 2017-06-21 MED ORDER — CHLORTHALIDONE 25 MG PO TABS: 25.0000 mg | ORAL_TABLET | Freq: Every day | ORAL | 0 refills | Status: DC

## 2017-06-21 MED ORDER — AMLODIPINE BESYLATE 5 MG PO TABS: 5.0000 mg | ORAL_TABLET | Freq: Every day | ORAL | 0 refills | Status: DC

## 2017-06-21 NOTE — Telephone Encounter (Signed)
Pt called back after visit today saying her chlorthalidone 25mg  [facility]  Pt's call back is [phone]  Thanks teri

## 2017-06-21 NOTE — Patient Instructions (Signed)
Exercising to Lose Weight Exercising can help you to lose weight. In order to lose weight through exercise, you need to do vigorous-intensity exercise. You can tell that you are exercising with vigorous intensity if you are breathing very hard and fast and cannot hold a conversation while exercising. Moderate-intensity exercise helps to maintain your current weight. You can tell that you are exercising at a moderate level if you have a higher heart rate and faster breathing, but you are still able to hold a conversation. How often should I exercise? Choose an activity that you enjoy and set realistic goals. Your health care provider can help you to make an activity plan that works for you. Exercise regularly as directed by your health care provider. This may include:  Doing resistance training twice each week, such as: ? Push-ups. ? Sit-ups. ? Lifting weights. ? Using resistance bands.  Doing a given intensity of exercise for a given amount of time. Choose from these options: ? 150 minutes of moderate-intensity exercise every week. ? 75 minutes of vigorous-intensity exercise every week. ? A mix of moderate-intensity and vigorous-intensity exercise every week.  Children, pregnant women, people who are out of shape, people who are overweight, and older adults may need to consult a health care provider for individual recommendations. If you have any sort of medical condition, be sure to consult your health care provider before starting a new exercise program. What are some activities that can help me to lose weight?  Walking at a rate of at least 4.5 miles an hour.  Jogging or running at a rate of 5 miles per hour.  Biking at a rate of at least 10 miles per hour.  Lap swimming.  Roller-skating or in-line skating.  Cross-country skiing.  Vigorous competitive sports, such as football, basketball, and soccer.  Jumping rope.  Aerobic dancing. How can I be more active in my day-to-day  activities?  Use the stairs instead of the elevator.  Take a walk during your lunch break.  If you drive, park your car farther away from work or school.  If you take public transportation, get off one stop early and walk the rest of the way.  Make all of your phone calls while standing up and walking around.  Get up, stretch, and walk around every 30 minutes throughout the day. What guidelines should I follow while exercising?  Do not exercise so much that you hurt yourself, feel dizzy, or get very short of breath.  Consult your health care provider prior to starting a new exercise program.  Wear comfortable clothes and shoes with good support.  Drink plenty of water while you exercise to prevent dehydration or heat stroke. Body water is lost during exercise and must be replaced.  Work out until you breathe faster and your heart beats faster. This information is not intended to replace advice given to you by your health care provider. Make sure you discuss any questions you have with your health care provider. Document Released: 11/10/2010 Document Revised: 03/15/2016 Document Reviewed: 03/11/2014 Elsevier Interactive Patient Education  2018 Elsevier Inc.  

## 2017-06-21 NOTE — Telephone Encounter (Signed)
Res[facility] to [facility] ?

## 2017-06-21 NOTE — Addendum Note (Signed)
Addended by: Margaretann LovelessBURNETTE, Shayle Donahoo M on: 06/21/2017 02:56 PM   Modules accepted: Orders

## 2017-06-21 NOTE — Progress Notes (Signed)
Patient: Ashley NavyFelicia B Boldman Female    DOB: 03/31/1966   51 y.o.   MRN: [id number] Visit Date: 06/21/2017  Today's Provider: Margaretann LovelessJennifer M Conor Lata, PA-C   Chief Complaint  Patient presents with  . Hypertension   Subjective:    HPI  Hypertension, follow-up:  BP Readings from Last 3 Encounters:  06/21/17 120/80  05/24/17 140/88  04/26/17 115/72    She was last seen for hypertension 4 weeks ago.  BP at that visit was 140/88. Management changes since that visit include add amlodipine 5 mg. She reports excellent compliance with treatment. She is not having side effects.  She is exercising. She is adherent to low salt diet.   Outside blood pressures are stable. She is experiencing none.  Patient denies chest pain.   Cardiovascular risk factors include hypertension and obesity (BMI >= 30 kg/m2).  Use of agents associated with hypertension: none.     Weight trend: decreasing steadily Wt Readings from Last 3 Encounters:  06/21/17 222 lb (100.7 kg)  05/24/17 228 lb (103.4 kg)  04/26/17 227 lb 3.2 oz (103.1 kg)    Current diet: in general, a "healthy" diet    ------------------------------------------------------------------------      Allergies  Allergen Reactions  . Lisinopril Itching    Mouth Itching     Current Outpatient Prescriptions:  .  amLODipine (NORVASC) 5 MG tablet, TAKE 1 TABLET BY MOUTH EVERY DAY, Disp: 30 tablet, Rfl: 0 .  calcium carbonate (OS-CAL) 600 MG TABS tablet, Take 1 tablet by mouth daily., Disp: , Rfl:  .  chlorthalidone (HYGROTON) 25 MG tablet, TAKE 1 TABLET BY MOUTH EVERY DAY, Disp: 30 tablet, Rfl: 0 .  cholecalciferol (VITAMIN D) 1000 UNITS tablet, Take 5 tablets by mouth daily., Disp: , Rfl:  .  cyanocobalamin 1000 MCG tablet, Take 1,000 mcg by mouth daily., Disp: , Rfl:  .  cyclobenzaprine (FLEXERIL) 5 MG tablet, Take 1 tablet (5 mg total) by mouth at bedtime., Disp: 90 tablet, Rfl: 1 .  fluticasone (FLONASE) 50 MCG/ACT nasal spray,  Place 2 sprays into both nostrils daily., Disp: 16 g, Rfl: 11 .  ibuprofen (ADVIL,MOTRIN) 800 MG tablet, Take 1 tablet (800 mg total) by mouth every 8 (eight) hours as needed., Disp: 180 tablet, Rfl: 1 .  loratadine (CLARITIN REDITABS) 10 MG dissolvable tablet, Take 1 tablet by mouth daily., Disp: , Rfl:  .  montelukast (SINGULAIR) 10 MG tablet, Take 1 tablet (10 mg total) by mouth at bedtime., Disp: 90 tablet, Rfl: 3 .  triamcinolone cream (KENALOG) 0.1 %, Apply 1 application topically 2 (two) times daily., Disp: 30 g, Rfl: 0  Review of Systems  Constitutional: Negative.   Respiratory: Negative.   Cardiovascular: Negative.   Gastrointestinal: Negative.   Allergic/Immunologic: Positive for environmental allergies.  Neurological: Negative.     Social History  Substance Use Topics  . Smoking status: Never Smoker  . Smokeless tobacco: Never Used  . Alcohol use No   Objective:   BP 120/80 (BP Location: Left Arm, Patient Position: Sitting, Cuff Size: Large)   Pulse 80   Temp (!) 97.5 F (36.4 C) (Oral)   Resp 16   Ht 5\' 5"  (1.651 m)   Wt 222 lb (100.7 kg)   SpO2 99%   BMI 36.94 kg/m  Vitals:   06/21/17 1338  BP: 120/80  Pulse: 80  Resp: 16  Temp: (!) 97.5 F (36.4 C)  TempSrc: Oral  SpO2: 99%  Weight: 222 lb (100.7  kg)  Height: 5\' 5"  (1.651 m)     Physical Exam  Constitutional: She appears well-developed and well-nourished. No distress.  Neck: Normal range of motion. Neck supple. No JVD present. No tracheal deviation present. No thyromegaly present.  Cardiovascular: Normal rate, regular rhythm and normal heart sounds.  Exam reveals no gallop and no friction rub.   No murmur heard. Pulmonary/Chest: Effort normal and breath sounds normal. No respiratory distress. She has no wheezes. She has no rales.  Musculoskeletal: She exhibits no edema.  Lymphadenopathy:    She has no cervical adenopathy.  Skin: She is not diaphoretic.  Vitals reviewed.      Assessment & Plan:      1. Essential hypertension Stable. Diagnosis pulled for medication refill. Continue current medical treatment plan. - amLODipine (NORVASC) 5 MG tablet; Take 1 tablet (5 mg total) by mouth daily.  Dispense: 90 tablet; Refill: 0 - chlorthalidone (HYGROTON) 25 MG tablet; Take 1 tablet (25 mg total) by mouth daily.  Dispense: 90 tablet; Refill: 0  2. BMI 36.0-36.9,adult Patient is doing meal prepping and has cut out sugars and sodas. She is going to start a weight lifting class tomorrow. She is down 6 pounds in the last 4 weeks with meal prep alone.       Margaretann Loveless, PA-C  [facility]

## 2017-07-22 ENCOUNTER — Telehealth: Payer: Self-pay

## 2017-07-22 DIAGNOSIS — I10 Essential (primary) hypertension: Secondary | ICD-10-CM

## 2017-07-22 MED ORDER — AMLODIPINE BESYLATE 5 MG PO TABS: 5.0000 mg | ORAL_TABLET | Freq: Every day | ORAL | 1 refills | Status: DC

## 2017-07-22 NOTE — Telephone Encounter (Signed)
Patient requesting one month with one refill for amlodipine sent to [facility] and D/C the 90 day supply due to insurance. Patient has a fu in November to fu on HTN. sd

## 2017-07-22 NOTE — Telephone Encounter (Signed)
[facility] faxed a refill request on the following medications:  amLODipine (NORVASC) 5 MG tablet.  [facility] mail order/MW

## 2017-07-25 ENCOUNTER — Encounter: Payer: Self-pay | Admitting: Physician Assistant

## 2017-08-01 ENCOUNTER — Encounter: Payer: Self-pay | Admitting: Physician Assistant

## 2017-09-20 ENCOUNTER — Encounter: Payer: Self-pay | Admitting: Physician Assistant

## 2017-09-20 ENCOUNTER — Ambulatory Visit: Payer: 59 | Admitting: Physician Assistant

## 2017-09-20 ENCOUNTER — Other Ambulatory Visit: Payer: Self-pay

## 2017-09-20 VITALS — BP 114/80 | HR 72 | Temp 97.8°F | Resp 16 | Ht 65.0 in | Wt 223.0 lb

## 2017-09-20 DIAGNOSIS — Z6836 Body mass index (BMI) 36.0-36.9, adult: Secondary | ICD-10-CM

## 2017-09-20 DIAGNOSIS — I10 Essential (primary) hypertension: Secondary | ICD-10-CM | POA: Diagnosis not present

## 2017-09-20 DIAGNOSIS — Z713 Dietary counseling and surveillance: Secondary | ICD-10-CM | POA: Diagnosis not present

## 2017-09-20 MED ORDER — PHENTERMINE HCL 30 MG PO CAPS: 30.0000 mg | ORAL_CAPSULE | ORAL | 2 refills | Status: DC

## 2017-09-20 MED ORDER — CHLORTHALIDONE 25 MG PO TABS: 25.0000 mg | ORAL_TABLET | Freq: Every day | ORAL | 4 refills | Status: DC

## 2017-09-20 MED ORDER — AMLODIPINE BESYLATE 5 MG PO TABS: 5.0000 mg | ORAL_TABLET | Freq: Every day | ORAL | 4 refills | Status: DC

## 2017-09-20 NOTE — Progress Notes (Signed)
Patient: Ashley NavyFelicia B Ciszek Female    DOB: 04/14/1966   51 y.o.   MRN: [id number] Visit Date: 09/20/2017  Today's Provider: Margaretann LovelessJennifer M Burnette, PA-C   Chief Complaint  Patient presents with  . Hypertension  . Follow-up   Subjective:    HPI  Hypertension, follow-up:  BP Readings from Last 3 Encounters:  09/20/17 114/80  06/21/17 120/80  05/24/17 140/88   She was last seen for hypertension 3 months ago.  BP at that visit was 120/80. Management changes since that visit include no changes. She reports excellent compliance with treatment. She is not having side effects.  She is not exercising. She is adherent to low salt diet.   Outside blood pressures are stable. She is experiencing none.  Patient denies chest pain and lower extremity edema.   Cardiovascular risk factors include hypertension and obesity (BMI >= 30 kg/m2).  Use of agents associated with hypertension: none.     Weight trend: stable Wt Readings from Last 3 Encounters:  09/20/17 223 lb (101.2 kg)  06/21/17 222 lb (100.7 kg)  05/24/17 228 lb (103.4 kg)   Current diet: in general, a "healthy" diet   ------------------------------------------------------------------------   Follow up for weight loss counseling  The patient was last seen for this 3 months ago. Changes made at last visit include continue working on diet and exercise.  She reports fair compliance with treatment. Patient reports she has been having to take care of sick family member and has not been able to work on diet and exercise. She feels that condition is Unchanged.  She does report that she has been [place] [facility] [place] [facility] [place] to help take care of her father-in-law. He recently had a hemorrhagic stroke and is in rehab up there. He does continue to have mini-strokes but they are hoping he will continue to improve so they can bring him home by Christmas.    ------------------------------------------------------------------------------------     Allergies  Allergen Reactions  . Lisinopril Itching    Mouth Itching     Current Outpatient Medications:  .  amLODipine (NORVASC) 5 MG tablet, Take 1 tablet (5 mg total) by mouth daily., Disp: 30 tablet, Rfl: 1 .  calcium carbonate (OS-CAL) 600 MG TABS tablet, Take 1 tablet by mouth daily., Disp: , Rfl:  .  chlorthalidone (HYGROTON) 25 MG tablet, Take 1 tablet (25 mg total) by mouth daily., Disp: 90 tablet, Rfl: 0 .  cholecalciferol (VITAMIN D) 1000 UNITS tablet, Take 5 tablets by mouth daily., Disp: , Rfl:  .  cyclobenzaprine (FLEXERIL) 5 MG tablet, Take 1 tablet (5 mg total) by mouth at bedtime., Disp: 90 tablet, Rfl: 1 .  fluticasone (FLONASE) 50 MCG/ACT nasal spray, Place 2 sprays into both nostrils daily., Disp: 16 g, Rfl: 11 .  ibuprofen (ADVIL,MOTRIN) 800 MG tablet, Take 1 tablet (800 mg total) by mouth every 8 (eight) hours as needed., Disp: 180 tablet, Rfl: 1 .  loratadine (CLARITIN REDITABS) 10 MG dissolvable tablet, Take 1 tablet by mouth daily., Disp: , Rfl:  .  montelukast (SINGULAIR) 10 MG tablet, Take 1 tablet (10 mg total) by mouth at bedtime., Disp: 90 tablet, Rfl: 3  Review of Systems  Constitutional: Negative.   Respiratory: Negative.   Cardiovascular: Negative.   Gastrointestinal: Negative.   Neurological: Negative.   Psychiatric/Behavioral: Negative.     Social History   Tobacco Use  . Smoking status: Never Smoker  . Smokeless tobacco: Never Used  Substance Use Topics  . Alcohol use: No    Alcohol/week: 0.0 oz   Objective:   BP 114/80 (BP Location: Left Arm, Patient Position: Sitting, Cuff Size: Large)   Pulse 72   Temp 97.8 F (36.6 C)   Resp 16   Ht 5\' 5"  (1.651 m)   Wt 223 lb (101.2 kg)   SpO2 98%   BMI 37.11 kg/m  Vitals:   09/20/17 1331  BP: 114/80  Pulse: 72  Resp: 16  Temp: 97.8 F (36.6 C)  SpO2: 98%  Weight: 223 lb (101.2 kg)  Height: 5'  5" (1.651 m)     Physical Exam  Constitutional: She appears well-developed and well-nourished. No distress.  Neck: Normal range of motion. Neck supple.  Cardiovascular: Normal rate, regular rhythm and normal heart sounds. Exam reveals no gallop and no friction rub.  No murmur heard. Pulmonary/Chest: Effort normal and breath sounds normal. No respiratory distress. She has no wheezes. She has no rales.  Skin: She is not diaphoretic.  Psychiatric: She has a normal mood and affect. Her behavior is normal. Judgment and thought content normal.  Vitals reviewed.      Assessment & Plan:     1. Essential hypertension Stable. Diagnosis pulled for medication refill. Continue current medical treatment plan. - amLODipine (NORVASC) 5 MG tablet; Take 1 tablet (5 mg total) by mouth daily.  Dispense: 60 tablet; Refill: 4 - chlorthalidone (HYGROTON) 25 MG tablet; Take 1 tablet (25 mg total) by mouth daily.  Dispense: 60 tablet; Refill: 4  2. BMI 36.0-36.9,adult Will start phentermine as below since patient's BP is much better controlled. She is to try to restart her food diary, meal prepping and exercise once things settle down with her father-in-law. I will see her back in 4-6 weeks to make sure she is tolerating phentermine well and BP is staying WNL.  - phentermine 30 MG capsule; Take 1 capsule (30 mg total) by mouth every morning.  Dispense: 30 capsule; Refill: 2  3. Weight loss counseling, encounter for See above medical treatment plan. - phentermine 30 MG capsule; Take 1 capsule (30 mg total) by mouth every morning.  Dispense: 30 capsule; Refill: 2       Margaretann LovelessJennifer M Burnette, PA-C  [facility]

## 2017-09-20 NOTE — Patient Instructions (Signed)

## 2017-10-06 ENCOUNTER — Encounter: Payer: Self-pay | Admitting: Physician Assistant

## 2017-10-08 ENCOUNTER — Other Ambulatory Visit: Payer: Self-pay

## 2017-10-08 DIAGNOSIS — I10 Essential (primary) hypertension: Secondary | ICD-10-CM

## 2017-10-08 MED ORDER — AMLODIPINE BESYLATE 5 MG PO TABS: 5.0000 mg | ORAL_TABLET | Freq: Every day | ORAL | 3 refills | Status: DC

## 2017-10-08 NOTE — Telephone Encounter (Signed)
Fax request from [facility] for Amlodipine 90 day supply-Anastasiya V Hopkins, RMA

## 2017-10-25 ENCOUNTER — Ambulatory Visit: Payer: Self-pay | Admitting: Physician Assistant

## 2017-11-01 ENCOUNTER — Encounter: Payer: Self-pay | Admitting: Physician Assistant

## 2017-11-01 ENCOUNTER — Ambulatory Visit (INDEPENDENT_AMBULATORY_CARE_PROVIDER_SITE_OTHER): Payer: Managed Care, Other (non HMO) | Admitting: Physician Assistant

## 2017-11-01 VITALS — BP 148/88 | HR 87 | Temp 98.5°F | Resp 16 | Wt 225.2 lb

## 2017-11-01 DIAGNOSIS — K602 Anal fissure, unspecified: Secondary | ICD-10-CM | POA: Diagnosis not present

## 2017-11-01 DIAGNOSIS — R197 Diarrhea, unspecified: Secondary | ICD-10-CM

## 2017-11-01 NOTE — Progress Notes (Signed)
Patient: Ashley Ballard Female    DOB: 1966-01-31   51 y.o.   MRN: [id number] Visit Date: 11/01/2017  Today's Provider: Margaretann Loveless, PA-C   Chief Complaint  Patient presents with  . Diarrhea   Subjective:    Diarrhea   This is a new (Went to [employer] Friday night and ate a Lobster and has been feeling sick since.) problem. The current episode started in the past 7 days. The problem occurs 2 to 4 times per day. The problem has been gradually improving. The stool consistency is described as watery ("little particles"). The patient states that diarrhea does not awaken her from sleep. Pertinent negatives include no abdominal pain, bloating, chills, fever, headaches, sweats or vomiting. Nothing (She reports that she feels like she needs to go to the bathroom but not able to. She also reports that is sore at her bottom.) aggravates the symptoms. Risk factors include suspect food intake. She has tried nothing for the symptoms.       Allergies  Allergen Reactions  . Lisinopril Itching    Mouth Itching     Current Outpatient Medications:  .  amLODipine (NORVASC) 5 MG tablet, Take 1 tablet (5 mg total) by mouth daily., Disp: 90 tablet, Rfl: 3 .  calcium carbonate (OS-CAL) 600 MG TABS tablet, Take 1 tablet by mouth daily., Disp: , Rfl:  .  chlorthalidone (HYGROTON) 25 MG tablet, Take 1 tablet (25 mg total) by mouth daily., Disp: 60 tablet, Rfl: 4 .  cholecalciferol (VITAMIN D) 1000 UNITS tablet, Take 5 tablets by mouth daily., Disp: , Rfl:  .  cyclobenzaprine (FLEXERIL) 5 MG tablet, Take 1 tablet (5 mg total) by mouth at bedtime., Disp: 90 tablet, Rfl: 1 .  fluticasone (FLONASE) 50 MCG/ACT nasal spray, Place 2 sprays into both nostrils daily., Disp: 16 g, Rfl: 11 .  ibuprofen (ADVIL,MOTRIN) 800 MG tablet, Take 1 tablet (800 mg total) by mouth every 8 (eight) hours as needed., Disp: 180 tablet, Rfl: 1 .  loratadine (CLARITIN REDITABS) 10 MG dissolvable tablet, Take 1 tablet  by mouth daily., Disp: , Rfl:  .  montelukast (SINGULAIR) 10 MG tablet, Take 1 tablet (10 mg total) by mouth at bedtime. (Patient not taking: Reported on 11/01/2017), Disp: 90 tablet, Rfl: 3 .  phentermine 30 MG capsule, Take 1 capsule (30 mg total) by mouth every morning. (Patient not taking: Reported on 11/01/2017), Disp: 30 capsule, Rfl: 2  Review of Systems  Constitutional: Negative for chills and fever.  Respiratory: Negative for shortness of breath.   Cardiovascular: Negative for chest pain, palpitations and leg swelling.  Gastrointestinal: Positive for diarrhea and rectal pain. Negative for abdominal distention, abdominal pain, anal bleeding, bloating, blood in stool, constipation, nausea and vomiting.  Skin: Negative for rash.  Neurological: Negative for headaches.    Social History   Tobacco Use  . Smoking status: Never Smoker  . Smokeless tobacco: Never Used  Substance Use Topics  . Alcohol use: No    Alcohol/week: 0.0 oz   Objective:   BP (!) 148/88 (BP Location: Left Arm, Patient Position: Sitting, Cuff Size: Normal)   Pulse 87   Temp 98.5 F (36.9 C) (Oral)   Resp 16   Wt 225 lb 3.2 oz (102.2 kg)   BMI 37.48 kg/m    Physical Exam  Constitutional: She appears well-developed and well-nourished. No distress.  Neck: Normal range of motion. Neck supple.  Cardiovascular: Normal rate, regular rhythm and normal  heart sounds. Exam reveals no gallop and no friction rub.  No murmur heard. Pulmonary/Chest: Effort normal and breath sounds normal. No respiratory distress. She has no wheezes. She has no rales.  Genitourinary: Rectal exam shows fissure.     Skin: She is not diaphoretic.  Vitals reviewed.     Assessment & Plan:     1. Diarrhea, unspecified type Improving some. Continue to push fluids and bland diet. Probiotics can help as well.   2. Anal fissure Small. Non-bleeding at this time. Discussed conservative management with preparation H or A+D ointment or  desitin for pain relief. Sitz baths. Call if no improvement.        Margaretann LovelessJennifer M Ether Wolters, PA-C  [facility]

## 2017-11-01 NOTE — Patient Instructions (Signed)
Food Choices to Help Relieve Diarrhea, Adult When you have diarrhea, the foods you eat and your eating habits are very important. Choosing the right foods and drinks can help:  Relieve diarrhea.  Replace lost fluids and nutrients.  Prevent dehydration.  What general guidelines should I follow? Relieving diarrhea  Choose foods with less than 2 g or .07 oz. of fiber per serving.  Limit fats to less than 8 tsp (38 g or 1.34 oz.) a day.  Avoid the following: ? Foods and beverages sweetened with high-fructose corn syrup, honey, or sugar alcohols such as xylitol, sorbitol, and mannitol. ? Foods that contain a lot of fat or sugar. ? Fried, greasy, or spicy foods. ? High-fiber grains, breads, and cereals. ? Raw fruits and vegetables.  Eat foods that are rich in probiotics. These foods include dairy products such as yogurt and fermented milk products. They help increase healthy bacteria in the stomach and intestines (gastrointestinal tract, or GI tract).  If you have lactose intolerance, avoid dairy products. These may make your diarrhea worse.  Take medicine to help stop diarrhea (antidiarrheal medicine) only as told by your health care provider. Replacing nutrients  Eat small meals or snacks every 3-4 hours.  Eat bland foods, such as white rice, toast, or baked potato, until your diarrhea starts to get better. Gradually reintroduce nutrient-rich foods as tolerated or as told by your health care provider. This includes: ? Well-cooked protein foods. ? Peeled, seeded, and soft-cooked fruits and vegetables. ? Low-fat dairy products.  Take vitamin and mineral supplements as told by your health care provider. Preventing dehydration   Start by sipping water or a special solution to prevent dehydration (oral rehydration solution, ORS). Urine that is clear or pale yellow means that you are getting enough fluid.  Try to drink at least 8-10 cups of fluid each day to help replace lost  fluids.  You may add other liquids in addition to water, such as clear juice or decaffeinated sports drinks, as tolerated or as told by your health care provider.  Avoid drinks with caffeine, such as coffee, tea, or soft drinks.  Avoid alcohol. What foods are recommended? The items listed may not be a complete list. Talk with your health care provider about what dietary choices are best for you. Grains White rice. White, French, or pita breads (fresh or toasted), including plain rolls, buns, or bagels. White pasta. Saltine, soda, or graham crackers. Pretzels. Low-fiber cereal. Cooked cereals made with water (such as cornmeal, farina, or cream cereals). Plain muffins. Matzo. Melba toast. Zwieback. Vegetables Potatoes (without the skin). Most well-cooked and canned vegetables without skins or seeds. Tender lettuce. Fruits Apple sauce. Fruits canned in juice. Cooked apricots, cherries, grapefruit, peaches, pears, or plums. Fresh bananas and cantaloupe. Meats and other protein foods Baked or boiled chicken. Eggs. Tofu. Fish. Seafood. Smooth nut butters. Ground or well-cooked tender beef, ham, veal, lamb, pork, or poultry. Dairy Plain yogurt, kefir, and unsweetened liquid yogurt. Lactose-free milk, buttermilk, skim milk, or soy milk. Low-fat or nonfat hard cheese. Beverages Water. Low-calorie sports drinks. Fruit juices without pulp. Strained tomato and vegetable juices. Decaffeinated teas. Sugar-free beverages not sweetened with sugar alcohols. Oral rehydration solutions, if approved by your health care provider. Seasoning and other foods Bouillon, broth, or soups made from recommended foods. What foods are not recommended? The items listed may not be a complete list. Talk with your health care provider about what dietary choices are best for you. Grains Whole grain, whole wheat,   bran, or rye breads, rolls, pastas, and crackers. Wild or brown rice. Whole grain or bran cereals. Barley. Oats and  oatmeal. Corn tortillas or taco shells. Granola. Popcorn. Vegetables Raw vegetables. Fried vegetables. Cabbage, broccoli, Brussels sprouts, artichokes, baked beans, beet greens, corn, kale, legumes, peas, sweet potatoes, and yams. Potato skins. Cooked spinach and cabbage. Fruits Dried fruit, including raisins and dates. Raw fruits. Stewed or dried prunes. Canned fruits with syrup. Meat and other protein foods Fried or fatty meats. Deli meats. Chunky nut butters. Nuts and seeds. Beans and lentils. Tomasa BlaseBacon. Hot dogs. Sausage. Dairy High-fat cheeses. Whole milk, chocolate milk, and beverages made with milk, such as milk shakes. Half-and-half. Cream. sour cream. Ice cream. Beverages Caffeinated beverages (such as coffee, tea, soda, or energy drinks). Alcoholic beverages. Fruit juices with pulp. Prune juice. Soft drinks sweetened with high-fructose corn syrup or sugar alcohols. High-calorie sports drinks. Fats and oils Butter. Cream sauces. Margarine. Salad oils. Plain salad dressings. Olives. Avocados. Mayonnaise. Sweets and desserts Sweet rolls, doughnuts, and sweet breads. Sugar-free desserts sweetened with sugar alcohols such as xylitol and sorbitol. Seasoning and other foods Honey. Hot sauce. Chili powder. Gravy. Cream-based or milk-based soups. Pancakes and waffles. Summary  When you have diarrhea, the foods you eat and your eating habits are very important.  Make sure you get at least 8-10 cups of fluid each day, or enough to keep your urine clear or pale yellow.  Eat bland foods and gradually reintroduce healthy, nutrient-rich foods as tolerated, or as told by your health care provider.  Avoid high-fiber, fried, greasy, or spicy foods. This information is not intended to replace advice given to you by your health care provider. Make sure you discuss any questions you have with your health care provider. Document Released: 12/29/2003 Document Revised: 10/05/2016 Document Reviewed:  10/05/2016 Elsevier Interactive Patient Education  2018 [employer] Anal Fissure, Adult An anal fissure is a small tear or crack in the skin around the opening of the butt (anus).Bleeding from the tear or crack usually stops on its own within a few minutes. The bleeding may happen every time you poop (have a bowel movement) until the tear or crack heals. Follow these instructions at home: Eating and drinking  Avoid bananas and dairy products. These foods can make it hard to poop.  Drink enough fluid to keep your pee (urine) clear or pale yellow.  Eat a lot of fruit, whole grains, and vegetables. General instructions  Keep the butt area as clean and dry as you can.  Take a warm water bath (sitz bath) as told by your doctor. Do not use soap.  Take over-the-counter and prescription medicines only as told by your doctor.  Use creams or ointments only as told by your doctor.  Keep all follow-up visits as told by your doctor. This is important. Contact a doctor if:  You have more bleeding.  You have a fever.  You have watery poop (diarrhea) that is mixed with blood.  You have pain.  You problem gets worse, not better. This information is not intended to replace advice given to you by your health care provider. Make sure you discuss any questions you have with your health care provider. Document Released: 06/06/2011 Document Revised: 03/15/2016 Document Reviewed: 01/03/2015 Elsevier Interactive Patient Education  [employer]2018 [employer] Inc.

## 2017-11-15 ENCOUNTER — Ambulatory Visit: Payer: Self-pay | Admitting: Physician Assistant

## 2017-12-31 ENCOUNTER — Ambulatory Visit (INDEPENDENT_AMBULATORY_CARE_PROVIDER_SITE_OTHER): Payer: Managed Care, Other (non HMO) | Admitting: Obstetrics and Gynecology

## 2017-12-31 ENCOUNTER — Encounter: Payer: Self-pay | Admitting: Obstetrics and Gynecology

## 2017-12-31 VITALS — BP 154/94 | Ht 64.0 in | Wt 226.0 lb

## 2017-12-31 DIAGNOSIS — N950 Postmenopausal bleeding: Secondary | ICD-10-CM | POA: Insufficient documentation

## 2017-12-31 NOTE — Progress Notes (Signed)
Obstetrics & Gynecology Office Visit    Chief Complaint  Patient presents with  . Vaginal Bleeding    pt has not had period in a year, had some spotting on Sunday   History of Present Illness: 52 y.o. [id number] female who has not had a menses in a year.  She noted a brown spotting/discharge. The first time she noted this was two days ago.  She noted some brown spots on her panty liner. She always wears a panty liner out of habit.  Her last menstruation was probably the end of 2017/early 2018.  She has continued to have brown spotting or faded pink on her pad.  She has had a little cramping on Sunday, was very mild.  She has had no weight changes.  Denies new constipation.  Denies bloating or early satiety.  She had intercourse Saturday night.  She has no other issues. She is simply concerned about her spotting. Her last pap smear was in 2015.  She had a question of whether she had post menopausal bleeding in 2015, but she was still menstruating infrequently and had a negative ultrasound and endometrial biopsy.    Past Medical History:  Diagnosis Date  . GERD (gastroesophageal reflux disease)   . Hypertension     Past Surgical History:  Procedure Laterality Date  . BIOPSY ENDOMETRIAL  01/21/2014  . BREAST CYST EXCISION Right 1983  . TUBAL LIGATION      Gynecologic History: No LMP recorded. Patient is not currently having periods (Reason: Perimenopausal).  Obstetric History: [id number]  Family History  Problem Relation Age of Onset  . Hypertension Mother   . Diabetes Mother   . Breast cancer Mother 84  . Pneumonia Paternal Grandmother   . Leukemia Paternal Grandfather   . Breast cancer Maternal Grandmother 2  . Heart attack Paternal Uncle   . Kidney cancer Maternal Grandfather     Social History   Socioeconomic History  . Marital status: Married    Spouse name: Casimiro Needle  . Number of children: 2  . Years of education: Not on file  . Highest education level: Not on file    Social Needs  . Financial resource strain: Not on file  . Food insecurity - worry: Not on file  . Food insecurity - inability: Not on file  . Transportation needs - medical: Not on file  . Transportation needs - non-medical: Not on file  Occupational History  . Occupation: full time  Tobacco Use  . Smoking status: Never Smoker  . Smokeless tobacco: Never Used  Substance and Sexual Activity  . Alcohol use: No    Alcohol/week: 0.0 oz  . Drug use: No  . Sexual activity: Yes  Other Topics Concern  . Not on file  Social History Narrative  . Not on file    Allergies  Allergen Reactions  . Lisinopril Itching    Mouth Itching    Prior to Admission medications   Medication Sig Start Date End Date Taking? Authorizing Provider  amLODipine (NORVASC) 5 MG tablet Take 1 tablet (5 mg total) by mouth daily. 10/08/17   Margaretann Loveless, PA-C  calcium carbonate (OS-CAL) 600 MG TABS tablet Take 1 tablet by mouth daily. 11/08/11   [provider]  chlorthalidone (HYGROTON) 25 MG tablet Take 1 tablet (25 mg total) by mouth daily. 09/20/17   Margaretann Loveless, PA-C  cholecalciferol (VITAMIN D) 1000 UNITS tablet Take 5 tablets by mouth daily. 05/26/12   [provider]  cyclobenzaprine (  FLEXERIL) 5 MG tablet Take 1 tablet (5 mg total) by mouth at bedtime. 05/24/17   Margaretann Loveless, PA-C  fluticasone (FLONASE) 50 MCG/ACT nasal spray Place 2 sprays into both nostrils daily. 05/13/17   Margaretann Loveless, PA-C  ibuprofen (ADVIL,MOTRIN) 800 MG tablet Take 1 tablet (800 mg total) by mouth every 8 (eight) hours as needed. 05/24/17   Margaretann Loveless, PA-C  loratadine (CLARITIN REDITABS) 10 MG dissolvable tablet Take 1 tablet by mouth daily.    [provider]    Review of Systems  Constitutional: Negative.   HENT: Negative.   Eyes: Negative.   Respiratory: Negative.   Cardiovascular: Negative.   Gastrointestinal: Negative.   Genitourinary: Negative.         See HPI  Musculoskeletal: Negative.   Skin: Negative.   Neurological: Negative.   Psychiatric/Behavioral: Negative.      Physical Exam BP (!) 154/94   Ht 5\' 4"  (1.626 m)   Wt 226 lb (102.5 kg)   BMI 38.79 kg/m  No LMP recorded. Patient is not currently having periods (Reason: Perimenopausal). Physical Exam  Constitutional: She is oriented to person, place, and time. She appears well-developed and well-nourished. No distress.  Genitourinary: Uterus normal. Pelvic exam was performed with patient supine. There is no rash, tenderness or lesion on the right labia. There is no rash, tenderness or lesion on the left labia. There is bleeding (old-appearing blood in vaginal vault, source unclear) in the vagina. No erythema in the vagina. No signs of injury around the vagina. No vaginal discharge found. Right adnexum does not display mass, does not display tenderness and does not display fullness. Left adnexum does not display mass, does not display tenderness and does not display fullness.  Cervix is not parous. Cervix does not exhibit motion tenderness or lesion.   Uterus is mobile. Uterus is not enlarged, tender, exhibiting a mass or irregular (is regular).  Genitourinary Comments: Scant old brown-appearing blood in coming from cervix. No active bleeding.  Eyes: EOM are normal. No scleral icterus.  Neck: Normal range of motion. Neck supple.  Cardiovascular: Normal rate and regular rhythm.  Pulmonary/Chest: Effort normal and breath sounds normal. No respiratory distress. She has no wheezes. She has no rales.  Abdominal: Soft. Bowel sounds are normal. She exhibits no distension and no mass. There is no tenderness. There is no rebound and no guarding.  Musculoskeletal: Normal range of motion. She exhibits no edema.  Neurological: She is alert and oriented to person, place, and time. No cranial nerve deficit.  Skin: Skin is warm and dry. No erythema.  Psychiatric: She has a normal mood and affect.  Her behavior is normal. Judgment normal.   Endometrial Biopsy After discussion with the patient regarding her abnormal uterine bleeding I recommended that she proceed with an endometrial biopsy for further diagnosis. The risks, benefits, alternatives, and indications for an endometrial biopsy were discussed with the patient in detail. She understood the risks including infection, bleeding, cervical laceration and uterine perforation.  Verbal consent was obtained.   PROCEDURE NOTE:  Pipelle endometrial biopsy was performed using aseptic technique with iodine preparation.  The uterus was sounded to a length of 7 cm.  Adequate sampling was obtained with minimal blood loss.  The patient tolerated the procedure well.    Female chaperone present for pelvic and breast  portions of the physical exam  Assessment: 52 y.o. G39P2002 female here for  1. Postmenopausal bleeding      Plan: Problem List  Items Addressed This Visit      Other   Postmenopausal bleeding - Primary   Relevant Orders   US PELVIS TRANSVANGINAL NON-OB (TV ONLY)   IGP,CtNg,AptimaHPV,rfx16/18,45   Pathology     New problem. Workup initiated today. Pap smear collected, endometrial biopsy collected.  Pelvic ultrasound to assess structural cause.   Thomasene MohairStephen Lindon Kiel, MD 01/01/2018 12:48 PM

## 2018-01-01 ENCOUNTER — Encounter: Payer: Self-pay | Admitting: Obstetrics and Gynecology

## 2018-01-02 LAB — PATHOLOGY

## 2018-01-03 LAB — IGP,CTNG,APTIMAHPV,RFX16/18,45
Chlamydia, Nuc. Acid Amp: NEGATIVE
GONOCOCCUS BY NUCLEIC ACID AMP: NEGATIVE
HPV APTIMA: NEGATIVE
PAP SMEAR COMMENT: 0

## 2018-01-08 ENCOUNTER — Ambulatory Visit (INDEPENDENT_AMBULATORY_CARE_PROVIDER_SITE_OTHER): Payer: Managed Care, Other (non HMO)

## 2018-01-08 ENCOUNTER — Encounter: Payer: Self-pay | Admitting: Obstetrics and Gynecology

## 2018-01-08 ENCOUNTER — Ambulatory Visit: Payer: Managed Care, Other (non HMO) | Admitting: Obstetrics and Gynecology

## 2018-01-08 VITALS — BP 132/84 | Ht 65.0 in | Wt 226.0 lb

## 2018-01-08 DIAGNOSIS — N840 Polyp of corpus uteri: Secondary | ICD-10-CM | POA: Diagnosis not present

## 2018-01-08 DIAGNOSIS — N950 Postmenopausal bleeding: Secondary | ICD-10-CM

## 2018-01-08 NOTE — Progress Notes (Signed)
Gynecology Ultrasound Follow Up   Chief Complaint  Patient presents with  . Follow-up  postmenopausal bleeding   History of Present Illness: Patient is a 52 y.o. female who presents today for ultrasound evaluation of the above .  Ultrasound demonstrates the following findings Adnexa: no masses seen  Uterus: anteverted with endometrial stripe  2.8 mm Additional: 7.5 x 2.8 mm intramural (anterior fundal) fibroid  Normal pap smear on 3/12 Endometrial biopsy: fragments of benign endometrial polyp without evidence of hyperplasia or malignancy.  Past Medical History:  Diagnosis Date  . GERD (gastroesophageal reflux disease)   . Hypertension     Past Surgical History:  Procedure Laterality Date  . BIOPSY ENDOMETRIAL  01/21/2014  . BREAST CYST EXCISION Right 1983  . TUBAL LIGATION       Family History  Problem Relation Age of Onset  . Hypertension Mother   . Diabetes Mother   . Breast cancer Mother 65  . Pneumonia Paternal Grandmother   . Leukemia Paternal Grandfather   . Breast cancer Maternal Grandmother 72  . Heart attack Paternal Uncle   . Kidney cancer Maternal Grandfather     Social History   Socioeconomic History  . Marital status: Married    Spouse name: Casimiro Needle  . Number of children: 2  . Years of education: Not on file  . Highest education level: Not on file  Social Needs  . Financial resource strain: Not on file  . Food insecurity - worry: Not on file  . Food insecurity - inability: Not on file  . Transportation needs - medical: Not on file  . Transportation needs - non-medical: Not on file  Occupational History  . Occupation: full time  Tobacco Use  . Smoking status: Never Smoker  . Smokeless tobacco: Never Used  Substance and Sexual Activity  . Alcohol use: No    Alcohol/week: 0.0 oz  . Drug use: No  . Sexual activity: Yes  Other Topics Concern  . Not on file  Social History Narrative  . Not on file    Allergies  Allergen Reactions    . Lisinopril Itching    Mouth Itching    Prior to Admission medications   Medication Sig Start Date End Date Taking? Authorizing Provider  amLODipine (NORVASC) 5 MG tablet Take 1 tablet (5 mg total) by mouth daily. 10/08/17   Margaretann Loveless, PA-C  calcium carbonate (OS-CAL) 600 MG TABS tablet Take 1 tablet by mouth daily. 11/08/11   [provider]  chlorthalidone (HYGROTON) 25 MG tablet Take 1 tablet (25 mg total) by mouth daily. 09/20/17   Margaretann Loveless, PA-C  cholecalciferol (VITAMIN D) 1000 UNITS tablet Take 5 tablets by mouth daily. 05/26/12   [provider]  cyclobenzaprine (FLEXERIL) 5 MG tablet Take 1 tablet (5 mg total) by mouth at bedtime. 05/24/17   Margaretann Loveless, PA-C  fluticasone (FLONASE) 50 MCG/ACT nasal spray Place 2 sprays into both nostrils daily. 05/13/17   Margaretann Loveless, PA-C  ibuprofen (ADVIL,MOTRIN) 800 MG tablet Take 1 tablet (800 mg total) by mouth every 8 (eight) hours as needed. 05/24/17   Margaretann Loveless, PA-C  loratadine (CLARITIN REDITABS) 10 MG dissolvable tablet Take 1 tablet by mouth daily.    [provider]  montelukast (SINGULAIR) 10 MG tablet Take 1 tablet (10 mg total) by mouth at bedtime. Patient not taking: Reported on 11/01/2017 07/22/15   Lorie Phenix, MD  phentermine 30 MG capsule Take 1 capsule (30 mg  total) by mouth every morning. Patient not taking: Reported on 11/01/2017 09/20/17   Margaretann LovelessBurnette, Jennifer M, PA-C    Physical Exam BP 132/84   Ht 5\' 5"  (1.651 m)   Wt 226 lb (102.5 kg)   BMI 37.61 kg/m    General: NAD HEENT: normocephalic, anicteric Pulmonary: No increased work of breathing Extremities: no edema, erythema, or tenderness Neurologic: Grossly intact, normal gait Psychiatric: mood appropriate, affect full   Assessment: 52 y.o. G2P2002  1. Postmenopausal bleeding   2. Endometrial polyp      Plan: Problem List Items Addressed This Visit      Genitourinary   Endometrial  polyp   Relevant Orders   US Sonohysterogram     Other   Postmenopausal bleeding - Primary   Relevant Orders   US Sonohysterogram     We discussed management options based on these findings.  She did have a finding of fragments of a benign endometrial polyp on her biopsy.  However, this was not seen on ultrasound.  Discussed options of going straight to surgery to get definitive diagnosis, SIS to aid in decision to go to surgery.  At this point, she believes she would like to go to surgery.  However, she would like an SIS to aid in her decision making.  15 minutes spent in face to face discussion with > 50% spent in counseling,management, and coordination of care of her postmenopausal bleeding and endometrial polyp.  Thomasene MohairStephen Jackson, MD, Merlinda FrederickFACOG [facility], [facility] 01/08/2018 1:28 PM

## 2018-01-10 ENCOUNTER — Encounter: Payer: Self-pay | Admitting: Physician Assistant

## 2018-01-10 ENCOUNTER — Ambulatory Visit: Payer: Managed Care, Other (non HMO) | Admitting: Physician Assistant

## 2018-01-10 ENCOUNTER — Telehealth: Payer: Self-pay | Admitting: Obstetrics and Gynecology

## 2018-01-10 VITALS — BP 130/80 | HR 82 | Temp 98.1°F | Resp 16 | Wt 224.6 lb

## 2018-01-10 DIAGNOSIS — M5440 Lumbago with sciatica, unspecified side: Secondary | ICD-10-CM | POA: Diagnosis not present

## 2018-01-10 DIAGNOSIS — I10 Essential (primary) hypertension: Secondary | ICD-10-CM | POA: Diagnosis not present

## 2018-01-10 DIAGNOSIS — G8929 Other chronic pain: Secondary | ICD-10-CM

## 2018-01-10 DIAGNOSIS — Z6837 Body mass index (BMI) 37.0-37.9, adult: Secondary | ICD-10-CM

## 2018-01-10 DIAGNOSIS — Z713 Dietary counseling and surveillance: Secondary | ICD-10-CM

## 2018-01-10 MED ORDER — PHENTERMINE HCL 37.5 MG PO TABS: 18.7500 mg | ORAL_TABLET | Freq: Every day | ORAL | 0 refills | Status: DC

## 2018-01-10 MED ORDER — IBUPROFEN 800 MG PO TABS: 800.0000 mg | ORAL_TABLET | Freq: Three times a day (TID) | ORAL | 1 refills | Status: DC | PRN

## 2018-01-10 NOTE — Telephone Encounter (Signed)
Lmtrc

## 2018-01-10 NOTE — Telephone Encounter (Signed)
Patient has decided she does not want SIS, would rather move ahead with D&C.  SIS cancelled.

## 2018-01-10 NOTE — Telephone Encounter (Signed)
[facility]. She should be getting a call from her soon to arrange the procedure.

## 2018-01-10 NOTE — Telephone Encounter (Signed)
-----   Message from Stephen D Jackson, MD sent at 01/10/2018  3:41 PM EDT ----- °Regarding: Schedule surgery °Surgery Booking Request °Patient Full Name:  Ashley Ballard  °MRN: [id number]  °DOB: 06/17/1966  °Surgeon: Stephen Jackson, MD  °Requested Surgery Date and Time: when patient available °Primary Diagnosis AND Code: postmenopausal bleeding, endometrial polyp °Secondary Diagnosis and Code:  °Surgical Procedure: hysteroscopy, dilation and curettage, and polypectomy °L&D Notification: No °Admission Status: same day surgery °Length of Surgery: 30 minutes °Special Case Needs: myosure hysteroscope °H&P: TBD (date) °Phone Interview???: no °Interpreter: °Language:  °Medical Clearance: no °Special Scheduling Instructions: none  °

## 2018-01-10 NOTE — Telephone Encounter (Signed)
-----   Message from Conard NovakStephen D Jackson, MD sent at 01/10/2018  3:41 PM EDT ----- Regarding: Schedule surgery Surgery Booking Request Patient Full Name:  Ashley Ballard  MRN: [id number]  DOB: [birth year]  Surgeon: Thomasene MohairStephen Jackson, MD  Requested Surgery Date and Time: when patient available Primary Diagnosis AND Code: postmenopausal bleeding, endometrial polyp Secondary Diagnosis and Code:  Surgical Procedure: hysteroscopy, dilation and curettage, and polypectomy L&D Notification: No Admission Status: same day surgery Length of Surgery: 30 minutes Special Case Needs: myosure hysteroscope H&P: TBD (date) Phone Interview???: no Interpreter: Language:  Medical Clearance: no Special Scheduling Instructions: none

## 2018-01-10 NOTE — Telephone Encounter (Signed)
[facility] @ 8:50am w/ Dr Jean RosenthalJackson, Pre-admit Testing afterwards, and OR on 02/13/18. Patient is aware [facility] may receive [facility] and [facility]. Ext given.

## 2018-01-10 NOTE — Progress Notes (Signed)
Patient: Ashley NavyFelicia B Ballard Female    DOB: Aug 10, 1966   52 y.o.   MRN: [id number] Visit Date: 01/10/2018  Today's Provider: Margaretann LovelessJennifer M Abednego Yeates, PA-C   Chief Complaint  Patient presents with  . Follow-up    BP and Weight   Subjective:    HPI  Weight, Follow up:  The patient was last seen for weight loss counseling 3 months ago. Changes made since that visit include start phentermine. Restart food diary and exercise.  Reports that she is not taking the Phentermine. She feels "afraid" to take it. She started weight watchers last week. ------------------------------------------------------------------------  Hypertension, follow-up:  BP Readings from Last 3 Encounters:  01/10/18 130/80  01/08/18 132/84  12/31/17 (!) 154/94    She was last seen for hypertension 3 months ago.  BP at that visit was 114/80 Management since that visit includes none.  She reports excellent compliance with treatment. She is not having side effects.  She is exercising.She reports walking 4 days a week. She is adherent to low salt diet.   Outside blood pressures are 130's/80-120/70. She is experiencing none.  Patient denies chest pain, chest pressure/discomfort, claudication, exertional chest pressure/discomfort, fatigue, irregular heart beat, lower extremity edema, near-syncope and palpitations.   Cardiovascular risk factors include hypertension and obesity (BMI >= 30 kg/m2).  Use of agents associated with hypertension: none.     Weight trend: decreasing steadily Wt Readings from Last 3 Encounters:  01/10/18 224 lb 9.6 oz (101.9 kg)  01/08/18 226 lb (102.5 kg)  12/31/17 226 lb (102.5 kg)    Current diet: in general, a "healthy" diet    ------------------------------------------------------------------------  She reports that she started started spotting March 10. She reports she saw her GYN on the 12 and had biopsy done and came back normal. It only showed polyps. Reports she is  sched[facility]    Allergies  Allergen Reactions  . Lisinopril Itching    Mouth Itching     Current Outpatient Medications:  .  amLODipine (NORVASC) 5 MG tablet, Take 1 tablet (5 mg total) by mouth daily., Disp: 90 tablet, Rfl: 3 .  calcium carbonate (OS-CAL) 600 MG TABS tablet, Take 1 tablet by mouth daily., Disp: , Rfl:  .  chlorthalidone (HYGROTON) 25 MG tablet, Take 1 tablet (25 mg total) by mouth daily., Disp: 60 tablet, Rfl: 4 .  cholecalciferol (VITAMIN D) 1000 UNITS tablet, Take 5 tablets by mouth daily., Disp: , Rfl:  .  cyclobenzaprine (FLEXERIL) 5 MG tablet, Take 1 tablet (5 mg total) by mouth at bedtime., Disp: 90 tablet, Rfl: 1 .  fluticasone (FLONASE) 50 MCG/ACT nasal spray, Place 2 sprays into both nostrils daily., Disp: 16 g, Rfl: 11 .  ibuprofen (ADVIL,MOTRIN) 800 MG tablet, Take 1 tablet (800 mg total) by mouth every 8 (eight) hours as needed., Disp: 180 tablet, Rfl: 1 .  loratadine (CLARITIN REDITABS) 10 MG dissolvable tablet, Take 1 tablet by mouth daily., Disp: , Rfl:  .  montelukast (SINGULAIR) 10 MG tablet, Take 1 tablet (10 mg total) by mouth at bedtime. (Patient not taking: Reported on 11/01/2017), Disp: 90 tablet, Rfl: 3 .  phentermine 30 MG capsule, Take 1 capsule (30 mg total) by mouth every morning. (Patient not taking: Reported on 11/01/2017), Disp: 30 capsule, Rfl: 2  Review of Systems  Constitutional: Negative.   Respiratory: Negative.   Cardiovascular: Negative.   Gastrointestinal: Negative.   Psychiatric/Behavioral: Negative.  Social History   Tobacco Use  . Smoking status: Never Smoker  . Smokeless tobacco: Never Used  Substance Use Topics  . Alcohol use: No    Alcohol/week: 0.0 oz   Objective:   BP 130/80 (BP Location: Left Arm, Patient Position: Sitting, Cuff Size: Normal)   Pulse 82   Temp 98.1 F (36.7 C) (Oral)   Resp 16   Wt 224 lb 9.6 oz (101.9 kg)   BMI 37.38 kg/m    Physical Exam    Constitutional: She appears well-developed and well-nourished. No distress.  Neck: Normal range of motion. Neck supple. No JVD present. No tracheal deviation present. No thyromegaly present.  Cardiovascular: Normal rate, regular rhythm and normal heart sounds. Exam reveals no gallop and no friction rub.  No murmur heard. Pulmonary/Chest: Effort normal and breath sounds normal. No respiratory distress. She has no wheezes. She has no rales.  Musculoskeletal: She exhibits no edema.  Lymphadenopathy:    She has no cervical adenopathy.  Skin: She is not diaphoretic.  Vitals reviewed.       Assessment & Plan:     1. BMI 37.0-37.9, adult Will restart phentermine as below. Start with half tab. Call if any adverse event. I will see her back in 4-6 weeks for weight recheck. - phentermine (ADIPEX-P) 37.5 MG tablet; Take 0.5 tablets (18.75 mg total) by mouth daily before breakfast.  Dispense: 30 tablet; Refill: 0  2. Encounter for weight loss counseling See above medical treatment plan. - phentermine (ADIPEX-P) 37.5 MG tablet; Take 0.5 tablets (18.75 mg total) by mouth daily before breakfast.  Dispense: 30 tablet; Refill: 0  3. Chronic low back pain with sciatica, sciatica laterality unspecified, unspecified back pain laterality Stable. Diagnosis pulled for medication refill. Continue current medical treatment plan. - ibuprofen (ADVIL,MOTRIN) 800 MG tablet; Take 1 tablet (800 mg total) by mouth every 8 (eight) hours as needed.  Dispense: 180 tablet; Refill: 1  4. Essential hypertension Improved. Continue amlodipine 5mg  and chlorthalidone 25mg        Margaretann Loveless, PA-C  [facility]

## 2018-01-14 ENCOUNTER — Encounter: Payer: Self-pay | Admitting: Physician Assistant

## 2018-01-15 ENCOUNTER — Encounter: Payer: Self-pay | Admitting: Physician Assistant

## 2018-01-15 DIAGNOSIS — N3000 Acute cystitis without hematuria: Secondary | ICD-10-CM

## 2018-01-15 MED ORDER — SULFAMETHOXAZOLE-TRIMETHOPRIM 800-160 MG PO TABS: 1.0000 | ORAL_TABLET | Freq: Two times a day (BID) | ORAL | 0 refills | Status: DC

## 2018-01-17 ENCOUNTER — Encounter: Payer: Self-pay | Admitting: Physician Assistant

## 2018-01-17 ENCOUNTER — Ambulatory Visit: Payer: Managed Care, Other (non HMO) | Admitting: Physician Assistant

## 2018-01-17 VITALS — BP 112/76 | HR 92 | Temp 97.9°F | Resp 16 | Wt 220.0 lb

## 2018-01-17 DIAGNOSIS — R350 Frequency of micturition: Secondary | ICD-10-CM | POA: Diagnosis not present

## 2018-01-17 DIAGNOSIS — N898 Other specified noninflammatory disorders of vagina: Secondary | ICD-10-CM | POA: Diagnosis not present

## 2018-01-17 DIAGNOSIS — N952 Postmenopausal atrophic vaginitis: Secondary | ICD-10-CM | POA: Diagnosis not present

## 2018-01-17 LAB — POCT URINALYSIS DIPSTICK
Bilirubin, UA: NEGATIVE
Blood, UA: NEGATIVE
Glucose, UA: NEGATIVE
KETONES UA: NEGATIVE
Leukocytes, UA: NEGATIVE
Nitrite, UA: NEGATIVE
PH UA: 6 (ref 5.0–8.0)
PROTEIN UA: NEGATIVE
Spec Grav, UA: 1.015 (ref 1.010–1.025)
UROBILINOGEN UA: 0.2 U/dL

## 2018-01-17 MED ORDER — ESTRADIOL 0.1 MG/GM VA CREA: 1.0000 | TOPICAL_CREAM | Freq: Every day | VAGINAL | 0 refills | Status: DC

## 2018-01-17 NOTE — Progress Notes (Signed)
Patient: Ashley Ballard Female    DOB: 03-16-1966   51 y.o.   MRN: [id number] Visit Date: 01/17/2018  Today's Provider: Margaretann Loveless, PA-C   Chief Complaint  Patient presents with  . Urinary Tract Infection   Subjective:    HPI Patient here today C/O frequent urination, denies any burning or painful urination. Patient denies any blood in urine. Patient was started on Bactrim on 01/15/18 reports good compliance and tolerance with medication. Patient reports symptoms are are about the same since starting medication.    Allergies  Allergen Reactions  . Lisinopril Itching    Mouth Itching     Current Outpatient Medications:  .  amLODipine (NORVASC) 5 MG tablet, Take 1 tablet (5 mg total) by mouth daily., Disp: 90 tablet, Rfl: 3 .  calcium carbonate (OS-CAL) 600 MG TABS tablet, Take 1 tablet by mouth daily., Disp: , Rfl:  .  chlorthalidone (HYGROTON) 25 MG tablet, Take 1 tablet (25 mg total) by mouth daily., Disp: 60 tablet, Rfl: 4 .  cholecalciferol (VITAMIN D) 1000 UNITS tablet, Take 5 tablets by mouth daily., Disp: , Rfl:  .  cyclobenzaprine (FLEXERIL) 5 MG tablet, Take 1 tablet (5 mg total) by mouth at bedtime., Disp: 90 tablet, Rfl: 1 .  fluticasone (FLONASE) 50 MCG/ACT nasal spray, Place 2 sprays into both nostrils daily., Disp: 16 g, Rfl: 11 .  ibuprofen (ADVIL,MOTRIN) 800 MG tablet, Take 1 tablet (800 mg total) by mouth every 8 (eight) hours as needed., Disp: 180 tablet, Rfl: 1 .  loratadine (CLARITIN REDITABS) 10 MG dissolvable tablet, Take 1 tablet by mouth daily., Disp: , Rfl:  .  montelukast (SINGULAIR) 10 MG tablet, Take 1 tablet (10 mg total) by mouth at bedtime., Disp: 90 tablet, Rfl: 3 .  phentermine (ADIPEX-P) 37.5 MG tablet, Take 0.5 tablets (18.75 mg total) by mouth daily before breakfast., Disp: 30 tablet, Rfl: 0 .  sulfamethoxazole-trimethoprim (BACTRIM DS,SEPTRA DS) 800-160 MG tablet, Take 1 tablet by mouth 2 (two) times daily., Disp: 20 tablet, Rfl:  0  Review of Systems  Constitutional: Negative.   Respiratory: Negative.   Cardiovascular: Negative.   Gastrointestinal: Negative.   Genitourinary: Positive for frequency. Negative for dysuria, enuresis, flank pain, vaginal bleeding, vaginal discharge and vaginal pain.       Irritation around urethra    Social History   Tobacco Use  . Smoking status: Never Smoker  . Smokeless tobacco: Never Used  Substance Use Topics  . Alcohol use: No    Alcohol/week: 0.0 oz   Objective:   BP 112/76 (BP Location: Left Arm, Patient Position: Sitting, Cuff Size: Large)   Pulse 92   Temp 97.9 F (36.6 C) (Oral)   Resp 16   Wt 220 lb (99.8 kg)   SpO2 99%   BMI 36.61 kg/m  Vitals:   01/17/18 1128  BP: 112/76  Pulse: 92  Resp: 16  Temp: 97.9 F (36.6 C)  TempSrc: Oral  SpO2: 99%  Weight: 220 lb (99.8 kg)     Physical Exam  Constitutional: She is oriented to person, place, and time. She appears well-developed and well-nourished. No distress.  Cardiovascular: Normal rate, regular rhythm and normal heart sounds. Exam reveals no gallop and no friction rub.  No murmur heard. Pulmonary/Chest: Effort normal and breath sounds normal. No respiratory distress. She has no wheezes. She has no rales.  Abdominal: Soft. Normal appearance and bowel sounds are normal. She exhibits no distension and no mass.  There is no hepatosplenomegaly. There is no tenderness. There is no rebound, no guarding and no CVA tenderness.  Neurological: She is alert and oriented to person, place, and time.  Skin: Skin is warm and dry. She is not diaphoretic.  Vitals reviewed.      Assessment & Plan:     1. Frequency of urination UA normal today. Will send for culture to confirm if any infection or not. Continue Bactrim for now. NuSwab collected to see if any yeast or BV noted. I will f/u pending results. Suspect either irritation from recent use of panty liner with spotting of blood. None recently. May also be vaginal  atrophy from menopause causing the sensation. Will do trial of topical estrogen cream. Call if no improvements.  - POCT urinalysis dipstick - NuSwab Vaginitis (VG) - Urine Culture  2. Vaginal atrophy See above medical treatment plan. - estradiol (ESTRACE) 0.1 MG/GM vaginal cream; Place 1 Applicatorful vaginally at bedtime.  Dispense: 42.5 g; Refill: 0 - NuSwab Vaginitis (VG) - Urine Culture  3. Vaginal dryness See above medical treatment plan. - estradiol (ESTRACE) 0.1 MG/GM vaginal cream; Place 1 Applicatorful vaginally at bedtime.  Dispense: 42.5 g; Refill: 0 - NuSwab Vaginitis (VG) - Urine Culture       Margaretann LovelessJennifer M Earma Nicolaou, PA-C  [facility]

## 2018-01-19 LAB — URINE CULTURE

## 2018-01-20 ENCOUNTER — Telehealth: Payer: Self-pay

## 2018-01-20 ENCOUNTER — Ambulatory Visit: Payer: Managed Care, Other (non HMO) | Admitting: Obstetrics and Gynecology

## 2018-01-20 ENCOUNTER — Other Ambulatory Visit: Payer: Managed Care, Other (non HMO)

## 2018-01-20 NOTE — Telephone Encounter (Signed)
Patient advised as below. Patient verbalizes understanding and is in agreement with treatment plan.  

## 2018-01-20 NOTE — Telephone Encounter (Signed)
-----   Message from Margaretann LovelessJennifer M Burnette, PA-C sent at 01/20/2018  1:25 PM EDT ----- Urine culture was negative for bacterial growth. OK to stop Bactrim.

## 2018-01-22 ENCOUNTER — Telehealth: Payer: Self-pay

## 2018-01-22 LAB — NUSWAB VAGINITIS (VG)
CANDIDA GLABRATA, NAA: NEGATIVE
Candida albicans, NAA: NEGATIVE
TRICH VAG BY NAA: NEGATIVE

## 2018-01-22 NOTE — Telephone Encounter (Signed)
-----   Message from Margaretann LovelessJennifer M Burnette, PA-C sent at 01/22/2018  8:28 AM EDT ----- Alwyn RenNuswab completely negative for BV and yeast.

## 2018-01-22 NOTE — Telephone Encounter (Signed)
lmtcb

## 2018-01-23 NOTE — Telephone Encounter (Signed)
Viewed by Jacques NavyFelicia B Marland on 01/22/2018 3:38 PM

## 2018-02-06 ENCOUNTER — Encounter: Payer: Self-pay | Admitting: Obstetrics and Gynecology

## 2018-02-06 ENCOUNTER — Other Ambulatory Visit: Payer: Self-pay

## 2018-02-06 ENCOUNTER — Ambulatory Visit (INDEPENDENT_AMBULATORY_CARE_PROVIDER_SITE_OTHER): Payer: Managed Care, Other (non HMO) | Admitting: Obstetrics and Gynecology

## 2018-02-06 ENCOUNTER — Encounter
Admission: RE | Admit: 2018-02-06 | Discharge: 2018-02-06 | Disposition: A | Discharge status: Home / Self Care | Payer: Managed Care, Other (non HMO) | Source: Ambulatory Visit | Attending: Obstetrics and Gynecology | Admitting: Obstetrics and Gynecology

## 2018-02-06 VITALS — BP 138/84 | Ht 64.0 in | Wt 215.0 lb

## 2018-02-06 DIAGNOSIS — Z01810 Encounter for preprocedural cardiovascular examination: Secondary | ICD-10-CM | POA: Diagnosis not present

## 2018-02-06 DIAGNOSIS — I10 Essential (primary) hypertension: Secondary | ICD-10-CM | POA: Diagnosis not present

## 2018-02-06 DIAGNOSIS — Z01812 Encounter for preprocedural laboratory examination: Secondary | ICD-10-CM | POA: Diagnosis present

## 2018-02-06 DIAGNOSIS — N840 Polyp of corpus uteri: Secondary | ICD-10-CM

## 2018-02-06 DIAGNOSIS — I119 Hypertensive heart disease without heart failure: Secondary | ICD-10-CM | POA: Insufficient documentation

## 2018-02-06 DIAGNOSIS — N950 Postmenopausal bleeding: Secondary | ICD-10-CM

## 2018-02-06 HISTORY — DX: Family history of other specified conditions: Z84.89

## 2018-02-06 LAB — CBC
HCT: 38.4 % (ref 35.0–47.0)
Hemoglobin: 13.1 g/dL (ref 12.0–16.0)
MCH: 32 pg (ref 26.0–34.0)
MCHC: 34.1 g/dL (ref 32.0–36.0)
MCV: 93.8 fL (ref 80.0–100.0)
PLATELETS: 272 10*3/uL (ref 150–440)
RBC: 4.1 MIL/uL (ref 3.80–5.20)
RDW: 12.8 % (ref 11.5–14.5)
WBC: 5 10*3/uL (ref 3.6–11.0)

## 2018-02-06 LAB — POTASSIUM: Potassium: 3.7 mmol/L (ref 3.5–5.1)

## 2018-02-06 NOTE — H&P (View-Only) (Signed)
Preoperative History and Physical  Jacques NavyFelicia B Parodi is a 52 y.o. [id number] here for surgical management of postmenopausal bleeding and endometrial polyp.   No significant preoperative concerns.  History of Present Illness: 52 y.o. 582P2002 female who has not had a menses in a year.  She noted a brown spotting/discharge. The first time she noted this was two days ago.  She noted some brown spots on her panty liner. She always wears a panty liner out of habit.  Her last menstruation was probably the end of 2017/early 2018.  She has continued to have brown spotting or faded pink on her pad.  She has had a little cramping on Sunday, was very mild.  She has had no weight changes.  Denies new constipation.  Denies bloating or early satiety.  She had intercourse Saturday night.  She has no other issues. She is simply concerned about her spotting. Her last pap smear was in 2015.  She had a question of whether she had post menopausal bleeding in 2015, but she was still menstruating infrequently and had a negative ultrasound and endometrial biopsy.    Ultrasound demonstrates the following findings Adnexa: no masses seen  Uterus: anteverted with endometrial stripe  2.8 mm Additional: 7.5 x 2.8 mm intramural (anterior fundal) fibroid  Normal pap smear on 12/31/17 Endometrial biopsy: fragments of benign endometrial polyp without evidence of hyperplasia or malignancy.  Proposed surgery: hysteroscopy, dilation and curettage, and endometrial polypectomy  Past Medical History:  Diagnosis Date  . GERD (gastroesophageal reflux disease)   . Hypertension    Past Surgical History:  Procedure Laterality Date  . BIOPSY ENDOMETRIAL  01/21/2014  . BREAST CYST EXCISION Right 1983  . TUBAL LIGATION     OB History  Gravida Para Term Preterm AB Living  2 2 2     2   SAB TAB Ectopic Multiple Live Births          2    # Outcome Date GA Lbr Len/2nd Weight Sex Delivery Anes PTL Lv  2 Term           1 Term             Patient denies any other pertinent gynecologic issues.   Current Outpatient Medications on File Prior to Visit  Medication Sig Dispense Refill  . amLODipine (NORVASC) 5 MG tablet Take 1 tablet (5 mg total) by mouth daily. 90 tablet 3  . Ascorbic Acid (VITAMIN C) 1000 MG tablet Take 2,000 mg by mouth daily.    . chlorthalidone (HYGROTON) 25 MG tablet Take 1 tablet (25 mg total) by mouth daily. (Patient taking differently: Take 25 mg by mouth every other day. In the morning) 60 tablet 4  . Cholecalciferol (VITAMIN D-3) 5000 units TABS Take 5,000 Units by mouth daily.    . cyclobenzaprine (FLEXERIL) 5 MG tablet Take 1 tablet (5 mg total) by mouth at bedtime. (Patient taking differently: Take 5 mg by mouth daily as needed for muscle spasms. ) 90 tablet 1  . fluticasone (FLONASE) 50 MCG/ACT nasal spray Place 2 sprays into both nostrils daily. (Patient taking differently: Place 2 sprays into both nostrils daily as needed for allergies. ) 16 g 11  . ibuprofen (ADVIL,MOTRIN) 800 MG tablet Take 1 tablet (800 mg total) by mouth every 8 (eight) hours as needed. (Patient taking differently: Take 800 mg by mouth every 8 (eight) hours as needed (for pain.). ) 180 tablet 1  . loratadine (CLARITIN) 10 MG tablet Take  10 mg by mouth daily.    . montelukast (SINGULAIR) 10 MG tablet Take 1 tablet (10 mg total) by mouth at bedtime. 90 tablet 3   No current facility-administered medications on file prior to visit.    Allergies  Allergen Reactions  . Lisinopril Itching    Mouth Itching    Social History:   reports that she has never smoked. She has never used smokeless tobacco. She reports that she does not drink alcohol or use drugs.  Family History  Problem Relation Age of Onset  . Hypertension Mother   . Diabetes Mother   . Breast cancer Mother 17  . Pneumonia Paternal Grandmother   . Leukemia Paternal Grandfather   . Breast cancer Maternal Grandmother 93  . Heart attack Paternal Uncle   . Kidney  cancer Maternal Grandfather     Review of Systems: Noncontributory  PHYSICAL EXAM: Blood pressure 138/84, height 5\' 4"  (1.626 m), weight 215 lb (97.5 kg). CONSTITUTIONAL: Well-developed, well-nourished female in no acute distress.  HENT:  Normocephalic, atraumatic, External right and left ear normal. Oropharynx is clear and moist EYES: Conjunctivae and EOM are normal. Pupils are equal, round, and reactive to light. No scleral icterus.  NECK: Normal range of motion, supple, no masses SKIN: Skin is warm and dry. No rash noted. Not diaphoretic. No erythema. No pallor. NEUROLGIC: Alert and oriented to person, place, and time. Normal reflexes, muscle tone coordination. No cranial nerve deficit noted. PSYCHIATRIC: Normal mood and affect. Normal behavior. Normal judgment and thought content. CARDIOVASCULAR: Normal heart rate noted, regular rhythm RESPIRATORY: Effort and breath sounds normal, no problems with respiration noted ABDOMEN: Soft, nontender, nondistended. PELVIC: Deferred MUSCULOSKELETAL: Normal range of motion. No edema and no tenderness. 2+ distal pulses.  Labs: No results found for this or any previous visit (from the past 336 hour(s)).  Imaging Studies: US Pelvis Transvanginal Non-ob (tv Only)  Result Date: 01/08/2018 ULTRASOUND REPORT Location: [facility] Date of Service: 01/08/2018 Indications:PMB Findings: The uterus is anteverted and measures 8.12 x 5.41 x 4.39cm. Echo texture is homogenous with evidence of focal masses. Fibroids: 1.  7.45 x 7.6mm; Anterior Fundal, Intramural The Endometrium measures 2.83 mm. Right Ovary is not identified. Left Ovary measures 2.17 x 2.22 x 1.21 cm. It is normal in appearance. Survey of the adnexa demonstrates no adnexal masses. There is no free fluid in the cul de sac. Impression: 1. Small fibroid Recommendations: 1.Clinical correlation with the patient's History and Physical Exam. Willette Alma, RDMS, RVT The ultrasound images and  findings were reviewed by me and I agree with the above report. Thomasene Mohair, MD, [facility], [facility] 01/08/2018 10:25 AM      Assessment: Patient Active Problem List   Diagnosis Date Noted  . Endometrial polyp 01/08/2018  . Postmenopausal bleeding 12/31/2017    Plan: Patient will undergo surgical management with the above procedure.   The risks of surgery were discussed in detail with the patient including but not limited to: bleeding which may require transfusion or reoperation; infection which may require antibiotics; injury to surrounding organs which may involve bowel, bladder, ureters ; need for additional procedures including laparoscopy or laparotomy; thromboembolic phenomenon, surgical site problems and other postoperative/anesthesia complications. Likelihood of success in alleviating the patient's condition was discussed. Routine postoperative instructions will be reviewed with the patient and her family in detail after surgery.  The patient concurred with the proposed plan, giving informed written consent for the surgery.  Preoperative prophylactic antibiotics, as  indicated, and SCDs ordered on call to the OR.    Thomasene Mohair, MD 02/06/2018 9:01 AM

## 2018-02-06 NOTE — Patient Instructions (Signed)
Your procedure is scheduled on: Thursday 02/13/18 Report to [place] [facility]. To find out your arrival time please call [phone] between 1PM - 3PM on Wednesday 02/12/18.  Remember: Instructions that are not followed completely may result in serious medical risk, up to and including death, or upon the discretion of your surgeon and anesthesiologist your surgery may need to be rescheduled.     _X__ 1. Do not eat food after midnight the night before your procedure.                 No gum chewing or hard candies. You may drink clear liquids up to 2 hours                 before you are scheduled to arrive for your surgery- DO not drink clear                 liquids within 2 hours of the start of your surgery.                 Clear Liquids include:  water, apple juice without pulp, clear carbohydrate                 drink such as Clearfast or Gatorade, Black Coffee or Tea (Do not add                 anything to coffee or tea).  __X__2.  On the morning of surgery brush your teeth with toothpaste and water, you                 may rinse your mouth with mouthwash if you wish.  Do not swallow any              toothpaste of mouthwash.     _X__ 3.  No Alcohol for 24 hours before or after surgery.   _X__ 4.  Do Not Smoke or use e-cigarettes For 24 Hours Prior to Your Surgery.                 Do not use any chewable tobacco products for at least 6 hours prior to                 surgery.  ____  5.  Bring all medications with you on the day of surgery if instructed.   __X__  6.  Notify your doctor if there is any change in your medical condition      (cold, fever, infections).     Do not wear jewelry, make-up, hairpins, clips or nail polish. Do not wear lotions, powders, or perfumes.  Do not shave 48 hours prior to surgery. Men may shave face and neck. Do not bring valuables to the hospital.    [facility] is not responsible for any belongings or  valuables.  Contacts, dentures/partials or body piercings may not be worn into surgery. Bring a case for your contacts, glasses or hearing aids, a denture cup will be supplied. Leave your suitcase in the car. After surgery it may be brought to your room. For patients admitted to the hospital, discharge time is determined by your treatment team.   Patients discharged the day of surgery will not be allowed to drive home.   Please read over the following fact sheets that you were given:   MRSA Information  __X__ Take these medicines the morning of surgery with A SIP OF WATER:  1. AMLODIPINE  2. LORATADINE  3.   4.  5.  6.  ____ Fleet Enema (as directed)   __X__ Use CHG Soap/SAGE wipes as directed  ____ Use inhalers on the day of surgery  ____ Stop metformin/Janumet/Farxiga 2 days prior to surgery    ____ Take 1/2 of usual insulin dose the night before surgery. No insulin the morning          of surgery.   ____ Stop Blood Thinners Coumadin/Plavix/Xarelto/Pleta/Pradaxa/Eliquis/Effient/Aspirin  on   Or contact your Surgeon, Cardiologist or Medical Doctor regarding  ability to stop your blood thinners  __X__ Stop Anti-inflammatories 7 days before surgery such as Advil, Ibuprofen, Motrin,  BC or Goodies Powder, Naprosyn, Naproxen, Aleve, Aspirin STOP TODAY   __X__ Stopall herbal supplements, fish oil or vitamin E until after surgery.    ____ Bring C-Pap to the hospital.

## 2018-02-06 NOTE — Progress Notes (Signed)
Preoperative History and Physical  Ashley Ballard is a 52 y.o. [id number] here for surgical management of postmenopausal bleeding and endometrial polyp.   No significant preoperative concerns.  History of Present Illness: 52 y.o. 582P2002 female who has not had a menses in a year.  She noted a brown spotting/discharge. The first time she noted this was two days ago.  She noted some brown spots on her panty liner. She always wears a panty liner out of habit.  Her last menstruation was probably the end of 2017/early 2018.  She has continued to have brown spotting or faded pink on her pad.  She has had a little cramping on Sunday, was very mild.  She has had no weight changes.  Denies new constipation.  Denies bloating or early satiety.  She had intercourse Saturday night.  She has no other issues. She is simply concerned about her spotting. Her last pap smear was in 2015.  She had a question of whether she had post menopausal bleeding in 2015, but she was still menstruating infrequently and had a negative ultrasound and endometrial biopsy.    Ultrasound demonstrates the following findings Adnexa: no masses seen  Uterus: anteverted with endometrial stripe  2.8 mm Additional: 7.5 x 2.8 mm intramural (anterior fundal) fibroid  Normal pap smear on 12/31/17 Endometrial biopsy: fragments of benign endometrial polyp without evidence of hyperplasia or malignancy.  Proposed surgery: hysteroscopy, dilation and curettage, and endometrial polypectomy  Past Medical History:  Diagnosis Date  . GERD (gastroesophageal reflux disease)   . Hypertension    Past Surgical History:  Procedure Laterality Date  . BIOPSY ENDOMETRIAL  01/21/2014  . BREAST CYST EXCISION Right 1983  . TUBAL LIGATION     OB History  Gravida Para Term Preterm AB Living  2 2 2     2   SAB TAB Ectopic Multiple Live Births          2    # Outcome Date GA Lbr Len/2nd Weight Sex Delivery Anes PTL Lv  2 Term           1 Term             Patient denies any other pertinent gynecologic issues.   Current Outpatient Medications on File Prior to Visit  Medication Sig Dispense Refill  . amLODipine (NORVASC) 5 MG tablet Take 1 tablet (5 mg total) by mouth daily. 90 tablet 3  . Ascorbic Acid (VITAMIN C) 1000 MG tablet Take 2,000 mg by mouth daily.    . chlorthalidone (HYGROTON) 25 MG tablet Take 1 tablet (25 mg total) by mouth daily. (Patient taking differently: Take 25 mg by mouth every other day. In the morning) 60 tablet 4  . Cholecalciferol (VITAMIN D-3) 5000 units TABS Take 5,000 Units by mouth daily.    . cyclobenzaprine (FLEXERIL) 5 MG tablet Take 1 tablet (5 mg total) by mouth at bedtime. (Patient taking differently: Take 5 mg by mouth daily as needed for muscle spasms. ) 90 tablet 1  . fluticasone (FLONASE) 50 MCG/ACT nasal spray Place 2 sprays into both nostrils daily. (Patient taking differently: Place 2 sprays into both nostrils daily as needed for allergies. ) 16 g 11  . ibuprofen (ADVIL,MOTRIN) 800 MG tablet Take 1 tablet (800 mg total) by mouth every 8 (eight) hours as needed. (Patient taking differently: Take 800 mg by mouth every 8 (eight) hours as needed (for pain.). ) 180 tablet 1  . loratadine (CLARITIN) 10 MG tablet Take  10 mg by mouth daily.    . montelukast (SINGULAIR) 10 MG tablet Take 1 tablet (10 mg total) by mouth at bedtime. 90 tablet 3   No current facility-administered medications on file prior to visit.    Allergies  Allergen Reactions  . Lisinopril Itching    Mouth Itching    Social History:   reports that she has never smoked. She has never used smokeless tobacco. She reports that she does not drink alcohol or use drugs.  Family History  Problem Relation Age of Onset  . Hypertension Mother   . Diabetes Mother   . Breast cancer Mother 17  . Pneumonia Paternal Grandmother   . Leukemia Paternal Grandfather   . Breast cancer Maternal Grandmother 93  . Heart attack Paternal Uncle   . Kidney  cancer Maternal Grandfather     Review of Systems: Noncontributory  PHYSICAL EXAM: Blood pressure 138/84, height 5\' 4"  (1.626 m), weight 215 lb (97.5 kg). CONSTITUTIONAL: Well-developed, well-nourished female in no acute distress.  HENT:  Normocephalic, atraumatic, External right and left ear normal. Oropharynx is clear and moist EYES: Conjunctivae and EOM are normal. Pupils are equal, round, and reactive to light. No scleral icterus.  NECK: Normal range of motion, supple, no masses SKIN: Skin is warm and dry. No rash noted. Not diaphoretic. No erythema. No pallor. NEUROLGIC: Alert and oriented to person, place, and time. Normal reflexes, muscle tone coordination. No cranial nerve deficit noted. PSYCHIATRIC: Normal mood and affect. Normal behavior. Normal judgment and thought content. CARDIOVASCULAR: Normal heart rate noted, regular rhythm RESPIRATORY: Effort and breath sounds normal, no problems with respiration noted ABDOMEN: Soft, nontender, nondistended. PELVIC: Deferred MUSCULOSKELETAL: Normal range of motion. No edema and no tenderness. 2+ distal pulses.  Labs: No results found for this or any previous visit (from the past 336 hour(s)).  Imaging Studies: US Pelvis Transvanginal Non-ob (tv Only)  Result Date: 01/08/2018 ULTRASOUND REPORT Location: [facility] Date of Service: 01/08/2018 Indications:PMB Findings: The uterus is anteverted and measures 8.12 x 5.41 x 4.39cm. Echo texture is homogenous with evidence of focal masses. Fibroids: 1.  7.45 x 7.6mm; Anterior Fundal, Intramural The Endometrium measures 2.83 mm. Right Ovary is not identified. Left Ovary measures 2.17 x 2.22 x 1.21 cm. It is normal in appearance. Survey of the adnexa demonstrates no adnexal masses. There is no free fluid in the cul de sac. Impression: 1. Small fibroid Recommendations: 1.Clinical correlation with the patient's History and Physical Exam. Ashley Ballard, RDMS, RVT The ultrasound images and  findings were reviewed by me and I agree with the above report. Ashley Mohair, MD, [facility], [facility] 01/08/2018 10:25 AM      Assessment: Patient Active Problem List   Diagnosis Date Noted  . Endometrial polyp 01/08/2018  . Postmenopausal bleeding 12/31/2017    Plan: Patient will undergo surgical management with the above procedure.   The risks of surgery were discussed in detail with the patient including but not limited to: bleeding which may require transfusion or reoperation; infection which may require antibiotics; injury to surrounding organs which may involve bowel, bladder, ureters ; need for additional procedures including laparoscopy or laparotomy; thromboembolic phenomenon, surgical site problems and other postoperative/anesthesia complications. Likelihood of success in alleviating the patient's condition was discussed. Routine postoperative instructions will be reviewed with the patient and her family in detail after surgery.  The patient concurred with the proposed plan, giving informed written consent for the surgery.  Preoperative prophylactic antibiotics, as  indicated, and SCDs ordered on call to the OR.    Ashley Mohair, MD 02/06/2018 9:01 AM

## 2018-02-06 NOTE — Pre-Procedure Instructions (Signed)
EKG COMPARED WITH 2017 

## 2018-02-11 ENCOUNTER — Encounter: Payer: Self-pay | Admitting: Family Medicine

## 2018-02-11 ENCOUNTER — Ambulatory Visit (INDEPENDENT_AMBULATORY_CARE_PROVIDER_SITE_OTHER): Payer: Managed Care, Other (non HMO) | Admitting: Family Medicine

## 2018-02-11 VITALS — BP 122/74 | HR 94 | Temp 98.1°F | Resp 16 | Wt 217.8 lb

## 2018-02-11 DIAGNOSIS — J069 Acute upper respiratory infection, unspecified: Secondary | ICD-10-CM

## 2018-02-11 NOTE — Progress Notes (Signed)
Subjective:     Patient ID: Ashley Ballard, female   DOB: [birth year], 52 y.o.   MRN: [id number] Chief Complaint  Patient presents with  . Nasal Congestion    Patient comes in office today with complaints of sore throat and sinus drainage for the past 3 days, patient reports that last night she ran a fever high of 100.3 but went away after taking tylenol. States allergies under control with Allegra D and steroid nasal spray.   HPI States her granddaughter has been having allergy-like symptoms. Followed by ENT, Dr. Jenne CampusMcqueen. No prior sinus surgery.  Review of Systems     Objective:   Physical Exam  Constitutional: She appears well-developed and well-nourished. No distress.  Ears: T.M's intact without inflammation Sinuses: non-tender Throat: no tonsillar enlargement or exudate Neck: no cervical adenopathy Lungs: clear     Assessment:    1. URI, acute    Plan:    Discussed adding Mucinex and Delsym for cough.

## 2018-02-11 NOTE — Patient Instructions (Signed)
Discussed use of Mucinex and Allegra D. May add Delsym for cough. Let me know if your sinuses are not improving over the next 5 days.

## 2018-02-12 ENCOUNTER — Telehealth: Payer: Self-pay | Admitting: Obstetrics and Gynecology

## 2018-02-12 NOTE — Pre-Procedure Instructions (Signed)
AFTER DISCUSSING WITH DR Henrene HawkingKEPHART PATIENT SEEN FOR ACUTE URI 02/11/18, NANCY AT DR [facility] NOTIFIED ANESTHESIA WOULD LIKE SURGERY R/S IN A COUPLE OF WEEKS.

## 2018-02-12 NOTE — Telephone Encounter (Signed)
Lmtrc for the patient earlier this morning, but the call has not yet been returned. Lmtrc again.

## 2018-02-13 ENCOUNTER — Encounter: Payer: Self-pay | Admitting: Physician Assistant

## 2018-02-13 ENCOUNTER — Telehealth: Payer: Self-pay | Admitting: Obstetrics and Gynecology

## 2018-02-13 DIAGNOSIS — J0140 Acute pansinusitis, unspecified: Secondary | ICD-10-CM

## 2018-02-13 MED ORDER — AZITHROMYCIN 250 MG PO TABS: ORAL_TABLET | ORAL | 0 refills | Status: DC

## 2018-02-13 NOTE — Telephone Encounter (Signed)
Patient is aware of Pre-admit Testing phone interview on 02/25/18 @ 9am-1pm. Patient is aware she may see the appointment on MyChart, but she does not have to go to Pre-admit, that Herbert SetaHeather will call her sometime between 9am and 1pm. Patient is aware [facility].

## 2018-02-14 ENCOUNTER — Encounter: Payer: Self-pay | Admitting: Physician Assistant

## 2018-02-14 ENCOUNTER — Ambulatory Visit: Payer: Managed Care, Other (non HMO) | Admitting: Physician Assistant

## 2018-02-14 VITALS — BP 120/78 | HR 72 | Temp 98.8°F | Resp 16 | Wt 217.0 lb

## 2018-02-14 DIAGNOSIS — H1013 Acute atopic conjunctivitis, bilateral: Secondary | ICD-10-CM | POA: Diagnosis not present

## 2018-02-14 DIAGNOSIS — J309 Allergic rhinitis, unspecified: Secondary | ICD-10-CM | POA: Diagnosis not present

## 2018-02-14 DIAGNOSIS — J0140 Acute pansinusitis, unspecified: Secondary | ICD-10-CM

## 2018-02-14 NOTE — Progress Notes (Signed)
Patient: Ashley Ballard Female    DOB: 1966-03-06   52 y.o.   MRN: [id number] Visit Date: 02/14/2018  Today's Provider: Margaretann Loveless, PA-C   Chief Complaint  Patient presents with  . Sinusitis   Subjective:    HPI Patient comes in today c/o sinusitis X 1 week. She was seen in the office 3 days ago, and was advised to take Mucinex and Delsym for cough. She called to the office yesterday stating that she was not any better. Zpak was called into her pharmacy, but she woke up this morning with swollen and puffy eyes. She denies any fever, but still has the cough and congestion.      Allergies  Allergen Reactions  . Lisinopril Itching    Mouth Itching     Current Outpatient Medications:  .  amLODipine (NORVASC) 5 MG tablet, Take 1 tablet (5 mg total) by mouth daily., Disp: 90 tablet, Rfl: 3 .  Ascorbic Acid (VITAMIN C) 1000 MG tablet, Take 2,000 mg by mouth daily., Disp: , Rfl:  .  azithromycin (ZITHROMAX) 250 MG tablet, Take 2 tablets PO on day one, and one tablet PO daily thereafter until completed., Disp: 6 tablet, Rfl: 0 .  chlorthalidone (HYGROTON) 25 MG tablet, Take 1 tablet (25 mg total) by mouth daily. (Patient taking differently: Take 25 mg by mouth every other day. In the morning), Disp: 60 tablet, Rfl: 4 .  Cholecalciferol (VITAMIN D-3) 5000 units TABS, Take 5,000 Units by mouth daily., Disp: , Rfl:  .  cyclobenzaprine (FLEXERIL) 5 MG tablet, Take 1 tablet (5 mg total) by mouth at bedtime. (Patient taking differently: Take 5 mg by mouth daily as needed for muscle spasms. ), Disp: 90 tablet, Rfl: 1 .  fluticasone (FLONASE) 50 MCG/ACT nasal spray, Place 2 sprays into both nostrils daily. (Patient taking differently: Place 2 sprays into both nostrils daily as needed for allergies. ), Disp: 16 g, Rfl: 11 .  ibuprofen (ADVIL,MOTRIN) 800 MG tablet, Take 1 tablet (800 mg total) by mouth every 8 (eight) hours as needed. (Patient taking differently: Take 800 mg by mouth  every 8 (eight) hours as needed (for pain.). ), Disp: 180 tablet, Rfl: 1 .  loratadine (CLARITIN) 10 MG tablet, Take 10 mg by mouth daily., Disp: , Rfl:  .  estradiol (ESTRACE) 0.1 MG/GM vaginal cream, Place 1 Applicatorful vaginally at bedtime. (Patient not taking: Reported on 02/14/2018), Disp: 42.5 g, Rfl: 0  Review of Systems  Constitutional: Positive for activity change, chills and fatigue.  HENT: Positive for congestion, postnasal drip, sinus pressure, sinus pain and sneezing.   Eyes: Positive for redness and itching. Negative for photophobia, pain, discharge and visual disturbance.  Respiratory: Positive for cough.   Allergic/Immunologic: Positive for environmental allergies.  Neurological: Positive for headaches.    Social History   Tobacco Use  . Smoking status: Never Smoker  . Smokeless tobacco: Never Used  Substance Use Topics  . Alcohol use: No    Alcohol/week: 0.0 oz   Objective:   BP 120/78 (BP Location: Right Arm, Patient Position: Sitting, Cuff Size: Large)   Pulse 72   Temp 98.8 F (37.1 C)   Resp 16   Wt 217 lb (98.4 kg)   LMP 11/19/2015   SpO2 99%   BMI 37.25 kg/m  Vitals:   02/14/18 1147  BP: 120/78  Pulse: 72  Resp: 16  Temp: 98.8 F (37.1 C)  SpO2: 99%  Weight: 217 lb (98.4  kg)     Physical Exam  Constitutional: She appears well-developed and well-nourished. No distress.  HENT:  Head: Normocephalic and atraumatic.  Right Ear: Hearing, tympanic membrane, external ear and ear canal normal.  Left Ear: Hearing, tympanic membrane, external ear and ear canal normal.  Nose: Right sinus exhibits maxillary sinus tenderness and frontal sinus tenderness. Left sinus exhibits maxillary sinus tenderness and frontal sinus tenderness.  Mouth/Throat: Uvula is midline, oropharynx is clear and moist and mucous membranes are normal. No oropharyngeal exudate.  Eyes: Pupils are equal, round, and reactive to light. EOM are normal. Right eye exhibits no chemosis, no  exudate and no hordeolum. Left eye exhibits no chemosis, no exudate and no hordeolum. Right conjunctiva is injected. Left conjunctiva is injected.  Neck: Normal range of motion. Neck supple. No tracheal deviation present. No thyromegaly present.  Cardiovascular: Normal rate, regular rhythm and normal heart sounds. Exam reveals no gallop and no friction rub.  No murmur heard. Pulmonary/Chest: Effort normal and breath sounds normal. No stridor. No respiratory distress. She has no wheezes. She has no rales.  Lymphadenopathy:    She has no cervical adenopathy.  Skin: She is not diaphoretic.  Vitals reviewed.      Assessment & Plan:     1. Acute pansinusitis, recurrence not specified Continue Zpak until completed. Add allegra-D x 2 weeks. Continue flonase.   2. Allergic conjunctivitis and rhinitis, bilateral Add allergy eye drops and eye mask (cooling) for puffiness and itching.        Margaretann LovelessJennifer M Syniah Berne, PA-C  [facility]

## 2018-02-20 ENCOUNTER — Telehealth: Payer: Self-pay | Admitting: Obstetrics and Gynecology

## 2018-02-20 NOTE — Telephone Encounter (Signed)
Patient called earlier to say that her upper respiratory infection was not improving, and that she was seen Monday by Dr. Jenne Campus at [facility], who prescribed augmentin and prednisone. Patient said she started both medications on Tuesday, and will take the augmentin through 03/03/18, and the prednisone through 03/01/18. Patient wanted to know if it was okay to have surgery on 03/04/18 as planned.  I spoke to Dr. Jean Rosenthal, who said she could have surgery as long as she had no symptoms and was doing well, and that it was okay to take the medications through the dates above. I contacted the patient who is aware she must have no symptoms. Patient is to call back if she is not better and needs to reschedule.

## 2018-02-25 ENCOUNTER — Inpatient Hospital Stay: Admission: RE | Admit: 2018-02-25 | Payer: Managed Care, Other (non HMO) | Source: Ambulatory Visit

## 2018-03-03 ENCOUNTER — Ambulatory Visit: Payer: Managed Care, Other (non HMO) | Admitting: Obstetrics and Gynecology

## 2018-03-04 ENCOUNTER — Ambulatory Visit: Payer: Managed Care, Other (non HMO) | Admitting: Anesthesiology

## 2018-03-04 ENCOUNTER — Encounter
Admission: RE | Disposition: A | Discharge status: Home / Self Care | Payer: Self-pay | Source: Ambulatory Visit | Attending: Obstetrics and Gynecology

## 2018-03-04 ENCOUNTER — Ambulatory Visit
Admission: RE | Admit: 2018-03-04 | Discharge: 2018-03-04 | Disposition: A | Discharge status: Home / Self Care | Payer: Managed Care, Other (non HMO) | Source: Ambulatory Visit | Attending: Obstetrics and Gynecology | Admitting: Obstetrics and Gynecology

## 2018-03-04 ENCOUNTER — Other Ambulatory Visit: Payer: Self-pay

## 2018-03-04 DIAGNOSIS — K219 Gastro-esophageal reflux disease without esophagitis: Secondary | ICD-10-CM | POA: Insufficient documentation

## 2018-03-04 DIAGNOSIS — N950 Postmenopausal bleeding: Secondary | ICD-10-CM | POA: Diagnosis present

## 2018-03-04 DIAGNOSIS — Z79899 Other long term (current) drug therapy: Secondary | ICD-10-CM | POA: Diagnosis not present

## 2018-03-04 DIAGNOSIS — I10 Essential (primary) hypertension: Secondary | ICD-10-CM | POA: Diagnosis not present

## 2018-03-04 DIAGNOSIS — N840 Polyp of corpus uteri: Secondary | ICD-10-CM | POA: Diagnosis present

## 2018-03-04 HISTORY — PX: DILATATION & CURETTAGE/HYSTEROSCOPY WITH MYOSURE: SHX6511

## 2018-03-04 LAB — POCT PREGNANCY, URINE: PREG TEST UR: NEGATIVE

## 2018-03-04 SURGERY — DILATATION & CURETTAGE/HYSTEROSCOPY WITH MYOSURE
Anesthesia: General | Site: Vagina | Wound class: Clean Contaminated

## 2018-03-04 MED ORDER — FAMOTIDINE 20 MG PO TABS
ORAL_TABLET | ORAL | Status: AC
  Filled 2018-03-04: qty 1

## 2018-03-04 MED ORDER — PROMETHAZINE HCL 25 MG/ML IJ SOLN: 6.2500 mg | INTRAMUSCULAR | Status: DC | PRN

## 2018-03-04 MED ORDER — LIDOCAINE HCL (PF) 2 % IJ SOLN
INTRAMUSCULAR | Status: AC
  Filled 2018-03-04: qty 10

## 2018-03-04 MED ORDER — ONDANSETRON HCL 4 MG/2ML IJ SOLN
INTRAMUSCULAR | Status: AC
  Filled 2018-03-04: qty 2

## 2018-03-04 MED ORDER — DEXAMETHASONE SODIUM PHOSPHATE 10 MG/ML IJ SOLN
INTRAMUSCULAR | Status: DC | PRN
  Administered 2018-03-04: 5 mg via INTRAVENOUS

## 2018-03-04 MED ORDER — PROPOFOL 10 MG/ML IV BOLUS
INTRAVENOUS | Status: AC
  Filled 2018-03-04: qty 20

## 2018-03-04 MED ORDER — OXYCODONE HCL 5 MG PO TABS: 5.0000 mg | ORAL_TABLET | Freq: Once | ORAL | Status: DC | PRN

## 2018-03-04 MED ORDER — FENTANYL CITRATE (PF) 100 MCG/2ML IJ SOLN: 25.0000 ug | INTRAMUSCULAR | Status: DC | PRN

## 2018-03-04 MED ORDER — LACTATED RINGERS IV SOLN: INTRAVENOUS | Status: DC

## 2018-03-04 MED ORDER — FAMOTIDINE 20 MG PO TABS
20.0000 mg | ORAL_TABLET | Freq: Once | ORAL | Status: AC
  Administered 2018-03-04: 20 mg via ORAL

## 2018-03-04 MED ORDER — ONDANSETRON HCL 4 MG/2ML IJ SOLN
INTRAMUSCULAR | Status: DC | PRN
  Administered 2018-03-04: 4 mg via INTRAVENOUS

## 2018-03-04 MED ORDER — HYDROCODONE-ACETAMINOPHEN 5-325 MG PO TABS: 1.0000 | ORAL_TABLET | Freq: Four times a day (QID) | ORAL | 0 refills | Status: DC | PRN

## 2018-03-04 MED ORDER — FENTANYL CITRATE (PF) 100 MCG/2ML IJ SOLN
INTRAMUSCULAR | Status: AC
  Filled 2018-03-04: qty 2

## 2018-03-04 MED ORDER — MIDAZOLAM HCL 2 MG/2ML IJ SOLN
INTRAMUSCULAR | Status: AC
  Filled 2018-03-04: qty 2

## 2018-03-04 MED ORDER — LACTATED RINGERS IV SOLN
INTRAVENOUS | Status: DC
  Administered 2018-03-04: 17:00:00 via INTRAVENOUS

## 2018-03-04 MED ORDER — GLYCOPYRROLATE 0.2 MG/ML IJ SOLN
INTRAMUSCULAR | Status: DC | PRN
  Administered 2018-03-04: 0.2 mg via INTRAVENOUS

## 2018-03-04 MED ORDER — MEPERIDINE HCL 50 MG/ML IJ SOLN: 6.2500 mg | INTRAMUSCULAR | Status: DC | PRN

## 2018-03-04 MED ORDER — DEXAMETHASONE SODIUM PHOSPHATE 10 MG/ML IJ SOLN
INTRAMUSCULAR | Status: AC
  Filled 2018-03-04: qty 1

## 2018-03-04 MED ORDER — ACETAMINOPHEN NICU IV SYRINGE 10 MG/ML
INTRAVENOUS | Status: AC
  Filled 2018-03-04: qty 1

## 2018-03-04 MED ORDER — ACETAMINOPHEN 10 MG/ML IV SOLN
INTRAVENOUS | Status: DC | PRN
  Administered 2018-03-04: 1000 mg via INTRAVENOUS

## 2018-03-04 MED ORDER — FENTANYL CITRATE (PF) 100 MCG/2ML IJ SOLN
INTRAMUSCULAR | Status: DC | PRN
  Administered 2018-03-04 (×2): 50 ug via INTRAVENOUS

## 2018-03-04 MED ORDER — PROPOFOL 10 MG/ML IV BOLUS
INTRAVENOUS | Status: DC | PRN
  Administered 2018-03-04: 40 mg via INTRAVENOUS
  Administered 2018-03-04: 160 mg via INTRAVENOUS

## 2018-03-04 MED ORDER — GLYCOPYRROLATE 0.2 MG/ML IJ SOLN
INTRAMUSCULAR | Status: AC
  Filled 2018-03-04: qty 1

## 2018-03-04 MED ORDER — OXYCODONE HCL 5 MG/5ML PO SOLN: 5.0000 mg | Freq: Once | ORAL | Status: DC | PRN

## 2018-03-04 MED ORDER — LIDOCAINE HCL (CARDIAC) PF 100 MG/5ML IV SOSY
PREFILLED_SYRINGE | INTRAVENOUS | Status: DC | PRN
  Administered 2018-03-04: 80 mg via INTRAVENOUS

## 2018-03-04 MED ORDER — MIDAZOLAM HCL 2 MG/2ML IJ SOLN
INTRAMUSCULAR | Status: DC | PRN
  Administered 2018-03-04: 2 mg via INTRAVENOUS

## 2018-03-04 SURGICAL SUPPLY — 23 items
BAG URINE DRAINAGE (UROLOGICAL SUPPLIES) IMPLANT
CANISTER SUC SOCK COL 7IN (MISCELLANEOUS) ×3 IMPLANT
CATH FOLEY 2WAY  5CC 16FR (CATHETERS)
CATH ROBINSON RED A/P 16FR (CATHETERS) ×3 IMPLANT
CATH URTH 16FR FL 2W BLN LF (CATHETERS) IMPLANT
DEVICE MYOSURE LITE (MISCELLANEOUS) ×3 IMPLANT
DEVICE MYOSURE REACH (MISCELLANEOUS) ×3 IMPLANT
ELECT REM PT RETURN 9FT ADLT (ELECTROSURGICAL) ×3
ELECTRODE REM PT RTRN 9FT ADLT (ELECTROSURGICAL) ×1 IMPLANT
GLOVE BIO SURGEON STRL SZ7 (GLOVE) ×3 IMPLANT
GLOVE BIOGEL PI IND STRL 7.5 (GLOVE) ×1 IMPLANT
GLOVE BIOGEL PI INDICATOR 7.5 (GLOVE) ×2
GOWN STRL REUS W/ TWL LRG LVL3 (GOWN DISPOSABLE) ×2 IMPLANT
GOWN STRL REUS W/TWL LRG LVL3 (GOWN DISPOSABLE) ×4
IV LACTATED RINGER IRRG 3000ML (IV SOLUTION) ×2
IV LR IRRIG 3000ML ARTHROMATIC (IV SOLUTION) ×1 IMPLANT
KIT TURNOVER CYSTO (KITS) ×3 IMPLANT
PACK DNC HYST (MISCELLANEOUS) ×3 IMPLANT
PAD OB MATERNITY 4.3X12.25 (PERSONAL CARE ITEMS) ×3 IMPLANT
PAD PREP 24X41 OB/GYN DISP (PERSONAL CARE ITEMS) ×3 IMPLANT
TUBING CONNECTING 10 (TUBING) ×2 IMPLANT
TUBING CONNECTING 10' (TUBING) ×1
TUBING HYSTEROSCOPY DOLPHIN (MISCELLANEOUS) ×3 IMPLANT

## 2018-03-04 NOTE — Anesthesia Procedure Notes (Addendum)
Procedure Name: LMA Insertion Date/Time: 03/04/2018 4:50 PM Performed by: Stormy Fabian, CRNA Pre-anesthesia Checklist: Patient identified, Patient being monitored, Timeout performed, Emergency Drugs available and Suction available Patient Re-evaluated:Patient Re-evaluated prior to induction Oxygen Delivery Method: Circle system utilized Preoxygenation: Pre-oxygenation with 100% oxygen Induction Type: IV induction Ventilation: Mask ventilation without difficulty LMA: LMA inserted LMA Size: 3.5 Tube type: Oral Number of attempts: 1 Placement Confirmation: positive ETCO2 and breath sounds checked- equal and bilateral Tube secured with: Tape Dental Injury: Teeth and Oropharynx as per pre-operative assessment

## 2018-03-04 NOTE — Interval H&P Note (Signed)
History and Physical Interval Note:  03/04/2018 4:47 PM  Ashley Ballard  has presented today for surgery, with the diagnosis of POSTMENOPAUSAL BLEEDING,ENDOMETRIAL POLYP  The various methods of treatment have been discussed with the patient and family. After consideration of risks, benefits and other options for treatment, the patient has consented to  Procedure(s): DILATATION & CURETTAGE/HYSTEROSCOPY WITH MYOSURE, POLYPECTOMY (N/A) as a surgical intervention .  The patient's history has been reviewed, patient examined, no change in status, stable for surgery.  I have reviewed the patient's chart and labs.  Questions were answered to the patient's satisfaction.    Thomasene Mohair, MD, [facility], [facility] 03/04/2018 4:47 PM

## 2018-03-04 NOTE — Discharge Instructions (Signed)

## 2018-03-04 NOTE — Transfer of Care (Signed)
Immediate Anesthesia Transfer of Care Note  Patient: Ashley Ballard  Procedure(s) Performed: DILATATION & CURETTAGE/HYSTEROSCOPY (N/A Vagina )  Patient Location: PACU  Anesthesia Type:General  Level of Consciousness: awake, alert  and oriented  Airway & Oxygen Therapy: Patient connected to face mask oxygen  Post-op Assessment: Post -op Vital signs reviewed and stable  Post vital signs: stable  Last Vitals:  Vitals Value Taken Time  BP 131/83 03/04/2018  5:53 PM  Temp 36.3 C 03/04/2018  5:51 PM  Pulse 69 03/04/2018  5:54 PM  Resp 17 03/04/2018  5:54 PM  SpO2 100 % 03/04/2018  5:54 PM  Vitals shown include unvalidated device data.  Last Pain:  Vitals:   03/04/18 1751  TempSrc: Temporal  PainSc:          Complications: No apparent anesthesia complications

## 2018-03-04 NOTE — Anesthesia Preprocedure Evaluation (Signed)
Anesthesia Evaluation  Patient identified by MRN, date of birth, ID band Patient awake    Reviewed: Allergy & Precautions, NPO status , Patient's Chart, lab work & pertinent test results  History of Anesthesia Complications (+) Family history of anesthesia reactionNegative for: history of anesthetic complications (reports mother had tongue swelling after anesthesia in past, does not sound like MH)  Airway Mallampati: II  TM Distance: >3 FB Neck ROM: Full    Dental  (+) Implants   Pulmonary neg pulmonary ROS, neg sleep apnea, neg COPD,    breath sounds clear to auscultation- rhonchi (-) wheezing      Cardiovascular Exercise Tolerance: Good hypertension, Pt. on medications (-) CAD, (-) Past MI, (-) Cardiac Stents and (-) CABG  Rhythm:Regular Rate:Normal - Systolic murmurs and - Diastolic murmurs    Neuro/Psych negative neurological ROS  negative psych ROS   GI/Hepatic Neg liver ROS, GERD  ,  Endo/Other  negative endocrine ROSneg diabetes  Renal/GU negative Renal ROS     Musculoskeletal negative musculoskeletal ROS (+)   Abdominal (+) + obese,   Peds  Hematology negative hematology ROS (+)   Anesthesia Other Findings Past Medical History: No date: Family history of adverse reaction to anesthesia     Comment:  mother had problems No date: GERD (gastroesophageal reflux disease) No date: Hypertension   Reproductive/Obstetrics                             Anesthesia Physical Anesthesia Plan  ASA: II  Anesthesia Plan: General   Post-op Pain Management:    Induction: Intravenous  PONV Risk Score and Plan: 2 and Ondansetron, Dexamethasone and Midazolam  Airway Management Planned: LMA  Additional Equipment:   Intra-op Plan:   Post-operative Plan:   Informed Consent: I have reviewed the patients History and Physical, chart, labs and discussed the procedure including the risks,  benefits and alternatives for the proposed anesthesia with the patient or authorized representative who has indicated his/her understanding and acceptance.   Dental advisory given  Plan Discussed with: CRNA and Anesthesiologist  Anesthesia Plan Comments:         Anesthesia Quick Evaluation

## 2018-03-04 NOTE — Anesthesia Post-op Follow-up Note (Signed)
Anesthesia QCDR form completed.        

## 2018-03-04 NOTE — Anesthesia Postprocedure Evaluation (Signed)
Anesthesia Post Note  Patient: Ashley Ballard  Procedure(s) Performed: DILATATION & CURETTAGE/HYSTEROSCOPY (N/A Vagina )  Patient location during evaluation: PACU Anesthesia Type: General Level of consciousness: awake and alert Pain management: pain level controlled Vital Signs Assessment: post-procedure vital signs reviewed and stable Respiratory status: spontaneous breathing, nonlabored ventilation, respiratory function stable and patient connected to nasal cannula oxygen Cardiovascular status: blood pressure returned to baseline and stable Postop Assessment: no apparent nausea or vomiting Anesthetic complications: no     Last Vitals:  Vitals:   03/04/18 1836 03/04/18 1850  BP: 135/88 139/83  Pulse: 71 75  Resp: 16 (!) 22  Temp:  36.9 C  SpO2: 100% 100%    Last Pain:  Vitals:   03/04/18 1850  TempSrc: Oral  PainSc: 0-No pain                 Lenard Simmer

## 2018-03-04 NOTE — Op Note (Signed)
Operative Note   03/04/2018  PRE-OP DIAGNOSIS:  1) postmenopausal bleeding 2) endometrial polyp   POST-OP DIAGNOSIS:  1) postmenopausal bleeding 2) no evidence of endometrial polyp   SURGEON: Surgeon(s) and Role:    Conard Novak, MD - Primary  PROCEDURE: Procedure(s): DILATATION & CURETTAGE/HYSTEROSCOPY   ANESTHESIA: General LMA  ESTIMATED BLOOD LOSS: minimal  DRAINS: none   TOTAL IV FLUIDS: 550 mL  SPECIMENS:  Endometrial curettings  VTE PROPHYLAXIS: SCDs to the bilateral lower extremities  ANTIBIOTICS: none indicated and none ordered  FLUID DEFICIT: minimal  COMPLICATIONS: none  DISPOSITION: PACU - hemodynamically stable.  CONDITION: stable  INDICATION: 52 y.o. female with postmenopausal bleeding.  Endometrial polyp on endometrial pipelle biopsy.  FINDINGS: Exam under anesthesia revealed small, mobile retroverted uterus with no masses and bilateral adnexa without masses or fullness. Hysteroscopy revealed a grossly normal appearing uterine cavity with bilateral tubal ostia and normal appearing endocervical canal.  PROCEDURE IN DETAIL:  After informed consent was obtained, the patient was taken to the operating room where anesthesia was obtained without difficulty. The patient was positioned in the dorsal lithotomy position in [place] stirrups.  The patient's bladder was catheterized with an in and out foley catheter.  The patient was examined under anesthesia, with the above noted findings.  The bi-valved speculum was placed inside the patient's vagina, and the the anterior lip of the cervix was seen and grasped with the tenaculum.  The cervix was progressively dilated to a 6 Hegar dilator.  The hysteroscope was introduced, with the above noted findings.  The hystersocope was removed and the uterine cavity was curetted until a gritty texture was noted, yielding minimal endometrial curettings.  The hysteroscope was re-introduced into the uterine cavity with  hemostasis was noted. The hysteroscope was removed and all instruments were removed.  Again, hemostasis noted throughout.  She was then taken out of dorsal lithotomy.  The patient tolerated the procedure well.  Sponge, lap and needle counts were correct x2.  The patient was taken to recovery room in excellent condition.  Conard Novak, MD, FACOG 03/04/2018 5:55 PM

## 2018-03-05 ENCOUNTER — Encounter: Payer: Self-pay | Admitting: Obstetrics and Gynecology

## 2018-03-06 ENCOUNTER — Encounter: Payer: Self-pay | Admitting: Physician Assistant

## 2018-03-07 LAB — SURGICAL PATHOLOGY

## 2018-03-20 ENCOUNTER — Encounter: Payer: Self-pay | Admitting: Obstetrics and Gynecology

## 2018-03-20 ENCOUNTER — Ambulatory Visit (INDEPENDENT_AMBULATORY_CARE_PROVIDER_SITE_OTHER): Payer: Managed Care, Other (non HMO) | Admitting: Obstetrics and Gynecology

## 2018-03-20 VITALS — BP 132/88 | HR 94 | Ht 65.0 in | Wt 218.0 lb

## 2018-03-20 DIAGNOSIS — N840 Polyp of corpus uteri: Secondary | ICD-10-CM

## 2018-03-20 DIAGNOSIS — N950 Postmenopausal bleeding: Secondary | ICD-10-CM

## 2018-03-20 NOTE — Progress Notes (Signed)
   Postoperative Follow-up Patient presents post op from hysteroscopy, D&C 2 weeks ago for postmenopausal bleeding.  Subjective: Patient reports marked improvement in her preop symptoms. Eating a regular diet without difficulty. The patient is not having any pain.  Activity: normal activities of daily living.  Objective: Vitals:   03/20/18 1635  BP: 132/88  Pulse: 94   Vital Signs: BP 132/88   Pulse 94   Ht  (1.651 m)   Wt 218 lb (98.9 kg)   LMP 11/19/2015   BMI 36.28 kg/m  Constitutional: Well nourished, well developed female in no acute distress.  HEENT: normal Skin: Warm and dry.  Extremity: no edema   Assessment: 52 y.o. s/p hysteroscopy, D&C progressing well  Plan: Patient has done well after surgery with no apparent complications.  I have discussed the post-operative course to date, and the expected progress moving forward.  The patient understands what complications to be concerned about.  I will see the patient in routine follow up, or sooner if needed.    Activity plan: No restriction.  Thomasene Mohair, MD 03/20/2018, 4:51 PM

## 2018-03-26 ENCOUNTER — Other Ambulatory Visit: Payer: Self-pay | Admitting: Physician Assistant

## 2018-03-26 DIAGNOSIS — Z1231 Encounter for screening mammogram for malignant neoplasm of breast: Secondary | ICD-10-CM

## 2018-04-15 ENCOUNTER — Encounter: Payer: Managed Care, Other (non HMO) | Admitting: Physician Assistant

## 2018-05-09 ENCOUNTER — Ambulatory Visit
Admission: RE | Admit: 2018-05-09 | Discharge: 2018-05-09 | Disposition: A | Discharge status: Home / Self Care | Payer: Managed Care, Other (non HMO) | Source: Ambulatory Visit | Attending: Physician Assistant | Admitting: Physician Assistant

## 2018-05-09 ENCOUNTER — Telehealth: Payer: Self-pay

## 2018-05-09 DIAGNOSIS — Z1231 Encounter for screening mammogram for malignant neoplasm of breast: Secondary | ICD-10-CM | POA: Diagnosis not present

## 2018-05-09 NOTE — Telephone Encounter (Signed)
Viewed by Jacques NavyFelicia B Cast on 05/09/2018 1:06 PM

## 2018-05-09 NOTE — Telephone Encounter (Signed)
-----   Message from Jennifer M Burnette, PA-C sent at 05/09/2018 12:58 PM EDT ----- Normal mammogram. Repeat screening in one year. 

## 2018-06-02 ENCOUNTER — Encounter: Payer: Managed Care, Other (non HMO) | Admitting: Physician Assistant

## 2018-06-19 ENCOUNTER — Encounter: Payer: Managed Care, Other (non HMO) | Admitting: Physician Assistant

## 2018-07-14 ENCOUNTER — Ambulatory Visit (INDEPENDENT_AMBULATORY_CARE_PROVIDER_SITE_OTHER): Payer: Managed Care, Other (non HMO) | Admitting: Physician Assistant

## 2018-07-14 ENCOUNTER — Encounter: Payer: Self-pay | Admitting: Physician Assistant

## 2018-07-14 VITALS — BP 130/82 | HR 72 | Temp 98.5°F | Resp 16 | Ht 65.0 in | Wt 225.0 lb

## 2018-07-14 DIAGNOSIS — E559 Vitamin D deficiency, unspecified: Secondary | ICD-10-CM

## 2018-07-14 DIAGNOSIS — Z2821 Immunization not carried out because of patient refusal: Secondary | ICD-10-CM

## 2018-07-14 DIAGNOSIS — Z1159 Encounter for screening for other viral diseases: Secondary | ICD-10-CM | POA: Diagnosis not present

## 2018-07-14 DIAGNOSIS — Z0000 Encounter for general adult medical examination without abnormal findings: Secondary | ICD-10-CM | POA: Diagnosis not present

## 2018-07-14 DIAGNOSIS — Z114 Encounter for screening for human immunodeficiency virus [HIV]: Secondary | ICD-10-CM

## 2018-07-14 DIAGNOSIS — G8929 Other chronic pain: Secondary | ICD-10-CM

## 2018-07-14 DIAGNOSIS — M5440 Lumbago with sciatica, unspecified side: Secondary | ICD-10-CM

## 2018-07-14 DIAGNOSIS — Z6837 Body mass index (BMI) 37.0-37.9, adult: Secondary | ICD-10-CM

## 2018-07-14 DIAGNOSIS — I10 Essential (primary) hypertension: Secondary | ICD-10-CM

## 2018-07-14 MED ORDER — PHENTERMINE HCL 37.5 MG PO TABS
37.5000 mg | ORAL_TABLET | Freq: Every day | ORAL | 0 refills | Status: DC
Start: 2018-07-14 — End: 2019-03-26

## 2018-07-14 MED ORDER — IBUPROFEN 800 MG PO TABS: 800.0000 mg | ORAL_TABLET | Freq: Three times a day (TID) | ORAL | 1 refills | Status: DC | PRN

## 2018-07-14 MED ORDER — CHLORTHALIDONE 25 MG PO TABS: 25.0000 mg | ORAL_TABLET | ORAL | 1 refills | Status: DC

## 2018-07-14 MED ORDER — AMLODIPINE BESYLATE 5 MG PO TABS: 5.0000 mg | ORAL_TABLET | Freq: Every day | ORAL | 3 refills | Status: DC

## 2018-07-14 NOTE — Progress Notes (Signed)
Patient: Ashley Ballard, Female    DOB: 05-May-1966, 52 y.o.   MRN: [id number] Visit Date: 07/14/2018  Today's Provider: Margaretann Loveless, PA-C   Chief Complaint  Patient presents with  . Annual Exam   Subjective:    Annual physical exam Ashley Ballard is a 52 y.o. female who presents today for health maintenance and complete physical. She feels well. She reports exercising. She reports she is sleeping fairly well. ----------------------------------------------------------------- Patient Declined Influenza Vaccine.  Review of Systems  Constitutional: Negative.   HENT: Negative.   Eyes: Negative.   Respiratory: Negative.   Cardiovascular: Negative.   Gastrointestinal: Negative.   Endocrine: Negative.   Genitourinary: Negative.   Musculoskeletal: Negative.   Skin: Negative.   Allergic/Immunologic: Negative.   Neurological: Negative.   Hematological: Negative.   Psychiatric/Behavioral: Negative.     Social History      She  reports that she has never smoked. She has never used smokeless tobacco. She reports that she does not drink alcohol or use drugs.       Social History   Socioeconomic History  . Marital status: Married    Spouse name: Casimiro Needle  . Number of children: 2  . Years of education: Not on file  . Highest education level: Not on file  Occupational History  . Occupation: full time  Social Needs  . Financial resource strain: Not on file  . Food insecurity:    Worry: Not on file    Inability: Not on file  . Transportation needs:    Medical: Not on file    Non-medical: Not on file  Tobacco Use  . Smoking status: Never Smoker  . Smokeless tobacco: Never Used  Substance and Sexual Activity  . Alcohol use: No    Alcohol/week: 0.0 standard drinks  . Drug use: No  . Sexual activity: Yes  Lifestyle  . Physical activity:    Days per week: Not on file    Minutes per session: Not on file  . Stress: Not on file  Relationships  . Social  connections:    Talks on phone: Not on file    Gets together: Not on file    Attends religious service: Not on file    Active member of club or organization: Not on file    Attends meetings of clubs or organizations: Not on file    Relationship status: Not on file  Other Topics Concern  . Not on file  Social History Narrative  . Not on file    Past Medical History:  Diagnosis Date  . Family history of adverse reaction to anesthesia    mother had problems  . GERD (gastroesophageal reflux disease)   . Hypertension      Patient Active Problem List   Diagnosis Date Noted  . Endometrial polyp 01/08/2018  . Postmenopausal bleeding 12/31/2017  . B12 deficiency 04/12/2017  . Essential hypertension 01/06/2016  . Chronic sinusitis 05/02/2015  . Abnormal ECG 04/07/2015  . 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 Acid reflux 04/07/2015  . Low back pain with sciatica 04/07/2015  . Adiposity 04/07/2015  . Hemorrhage, postmenopausal 04/07/2015  . Pain 04/07/2015  . Avitaminosis D 04/07/2015    Past Surgical History:  Procedure Laterality Date  . BIOPSY ENDOMETRIAL  01/21/2014  . BREAST CYST EXCISION Right 1983  . DILATATION & CURETTAGE/HYSTEROSCOPY WITH MYOSURE N/A 03/04/2018   Procedure: DILATATION & CURETTAGE/HYSTEROSCOPY;  Surgeon: Conard Novak, MD;  Location: [facility] ORS;  Service: Gynecology;  Laterality: N/A;  . TUBAL LIGATION      Family History        Family Status  Relation Name Status  . Mother  Alive  . PGM  Deceased  . PGF  Deceased  . Sister 1 Alive  . Brother 1 Alive  . MGM  Deceased  . Sister 2 Alive  . Oneal Grout  (Not Specified)  . MGF  (Not Specified)        Her family history includes Breast cancer (age of onset: 40) in her mother; Breast cancer (age of onset: 59) in her maternal grandmother; Diabetes in her mother; Heart attack in her paternal uncle; Hypertension in her mother; Kidney cancer in her maternal grandfather; Leukemia in her paternal grandfather;  Pneumonia in her paternal grandmother.      Allergies  Allergen Reactions  . Lisinopril Itching    Mouth Itching     Current Outpatient Medications:  .  amLODipine (NORVASC) 5 MG tablet, Take 1 tablet (5 mg total) by mouth daily., Disp: 90 tablet, Rfl: 3 .  Ascorbic Acid (VITAMIN C) 1000 MG tablet, Take 2,000 mg by mouth daily., Disp: , Rfl:  .  chlorthalidone (HYGROTON) 25 MG tablet, Take 1 tablet (25 mg total) by mouth daily. (Patient taking differently: Take 25 mg by mouth every other day. In the morning), Disp: 60 tablet, Rfl: 4 .  Cholecalciferol (VITAMIN D-3) 5000 units TABS, Take 5,000 Units by mouth daily., Disp: , Rfl:  .  cyclobenzaprine (FLEXERIL) 5 MG tablet, Take 1 tablet (5 mg total) by mouth at bedtime. (Patient taking differently: Take 5 mg by mouth daily as needed for muscle spasms. ), Disp: 90 tablet, Rfl: 1 .  fluticasone (FLONASE) 50 MCG/ACT nasal spray, Place 2 sprays into both nostrils daily. (Patient taking differently: Place 2 sprays into both nostrils daily as needed for allergies. ), Disp: 16 g, Rfl: 11 .  ibuprofen (ADVIL,MOTRIN) 800 MG tablet, Take 1 tablet (800 mg total) by mouth every 8 (eight) hours as needed. (Patient taking differently: Take 800 mg by mouth every 8 (eight) hours as needed (for pain.). ), Disp: 180 tablet, Rfl: 1 .  loratadine (CLARITIN) 10 MG tablet, Take 10 mg by mouth daily., Disp: , Rfl:    Patient Care Team: Reine Just as PCP - General (Physician Assistant)      Objective:   Vitals: BP 130/82 (BP Location: Left Arm, Patient Position: Sitting, Cuff Size: Normal)   Pulse 72   Temp 98.5 F (36.9 C) (Oral)   Resp 16   Ht 5\' 5"  (1.651 m)   Wt 225 lb (102.1 kg)   LMP 11/19/2015   BMI 37.44 kg/m    Vitals:   07/14/18 1406  BP: 130/82  Pulse: 72  Resp: 16  Temp: 98.5 F (36.9 C)  TempSrc: Oral  Weight: 225 lb (102.1 kg)  Height: 5\' 5"  (1.651 m)     Physical Exam  Constitutional: She is oriented to person,  place, and time. She appears well-developed and well-nourished. No distress.  HENT:  Head: Normocephalic and atraumatic.  Right Ear: Hearing, tympanic membrane, external ear and ear canal normal.  Left Ear: Hearing, tympanic membrane, external ear and ear canal normal.  Nose: Nose normal.  Mouth/Throat: Uvula is midline, oropharynx is clear and moist and mucous membranes are normal. No oropharyngeal exudate.  Eyes: Pupils are equal, round, and reactive to light. Conjunctivae and EOM are normal. Right eye exhibits no discharge. Left  eye exhibits no discharge. No scleral icterus.  Neck: Normal range of motion. Neck supple. No JVD present. No tracheal deviation present. No thyromegaly present.  Cardiovascular: Normal rate, regular rhythm, normal heart sounds and intact distal pulses. Exam reveals no gallop and no friction rub.  No murmur heard. Pulmonary/Chest: Effort normal and breath sounds normal. No respiratory distress. She has no wheezes. She has no rales. She exhibits no tenderness.  Abdominal: Soft. Bowel sounds are normal. She exhibits no distension and no mass. There is no tenderness. There is no rebound and no guarding.  Musculoskeletal: Normal range of motion. She exhibits no edema or tenderness.  Lymphadenopathy:    She has no cervical adenopathy.  Neurological: She is alert and oriented to person, place, and time.  Skin: Skin is warm and dry. No rash noted. She is not diaphoretic.  Psychiatric: She has a normal mood and affect. Her behavior is normal. Judgment and thought content normal.  Vitals reviewed.    Depression Screen PHQ 2/9 Scores 07/14/2018 04/12/2017  PHQ - 2 Score 0 0  PHQ- 9 Score - 0     Assessment & Plan:     Routine Health Maintenance and Physical Exam  Exercise Activities and Dietary recommendations Goals   None     Immunization History  Administered Date(s) Administered  . Tdap 10/27/2008    Health Maintenance  Topic Date Due  . Hepatitis C  Screening  1966-06-25  . HIV Screening  01/04/1979  . INFLUENZA VACCINE  05/22/2018  . TETANUS/TDAP  10/27/2018  . MAMMOGRAM  05/09/2020  . PAP SMEAR  12/31/2020  . COLONOSCOPY  03/17/2024     Discussed health benefits of physical activity, and encouraged her to engage in regular exercise appropriate for her age and condition.    1. Annual physical exam Normal physical exam today. Will check labs as below and f/u pending lab results. If labs are stable and WNL she will not need to have these rechecked for one year at her next annual physical exam. She is to call the office in the meantime if she has any acute issue, questions or concerns. - CBC with Differential/Platelet - Comprehensive metabolic panel - TSH  2. Avitaminosis D H/O this. Will check labs as below and f/u pending results. - Vitamin D (25 hydroxy)  3. Screening for HIV without presence of risk factors Will check labs as below and f/u pending results. - HIV antibody (with reflex)  4. Encounter for hepatitis C screening test for low risk patient Will check labs as below and f/u pending results. - Hepatitis C Antibody  5. Essential hypertension Stable. Diagnosis pulled for medication refill. Continue current medical treatment plan. - amLODipine (NORVASC) 5 MG tablet; Take 1 tablet (5 mg total) by mouth daily.  Dispense: 90 tablet; Refill: 3 - chlorthalidone (HYGROTON) 25 MG tablet; Take 1 tablet (25 mg total) by mouth every other day. In the morning  Dispense: 90 tablet; Refill: 1  6. Chronic low back pain with sciatica, sciatica laterality unspecified, unspecified back pain laterality Stable. Diagnosis pulled for medication refill. Only uses prn. Continue current medical treatment plan. - ibuprofen (ADVIL,MOTRIN) 800 MG tablet; Take 1 tablet (800 mg total) by mouth every 8 (eight) hours as needed (for pain.).  Dispense: 180 tablet; Refill: 1  7. BMI 37.0-37.9, adult Restart phentermine for appetite control and  weight loss. Work on food diary and limiting portion sizes. Continue with walking regimen. Walking oone hour daily.  - phentermine (ADIPEX-P) 37.5 MG  tablet; Take 1 tablet (37.5 mg total) by mouth daily before breakfast.  Dispense: 30 tablet; Refill: 0  8. Influenza vaccination declined  --------------------------------------------------------------------    Margaretann LovelessJennifer M Larnce Schnackenberg, PA-C  [facility]

## 2018-07-14 NOTE — Patient Instructions (Signed)

## 2018-07-15 ENCOUNTER — Telehealth: Payer: Self-pay

## 2018-07-15 LAB — COMPREHENSIVE METABOLIC PANEL
ALT: 21 IU/L (ref 0–32)
AST: 23 IU/L (ref 0–40)
Albumin/Globulin Ratio: 1.9 (ref 1.2–2.2)
Albumin: 4.6 g/dL (ref 3.5–5.5)
Alkaline Phosphatase: 97 IU/L (ref 39–117)
BUN/Creatinine Ratio: 17 (ref 9–23)
BUN: 12 mg/dL (ref 6–24)
Bilirubin Total: 0.8 mg/dL (ref 0.0–1.2)
CALCIUM: 9.1 mg/dL (ref 8.7–10.2)
CO2: 21 mmol/L (ref 20–29)
CREATININE: 0.7 mg/dL (ref 0.57–1.00)
Chloride: 104 mmol/L (ref 96–106)
GFR, EST AFRICAN AMERICAN: 114 mL/min/{1.73_m2} (ref >59)
GFR, EST NON AFRICAN AMERICAN: 99 mL/min/{1.73_m2} (ref >59)
GLUCOSE: 76 mg/dL (ref 65–99)
Globulin, Total: 2.4 g/dL (ref 1.5–4.5)
Potassium: 4.1 mmol/L (ref 3.5–5.2)
Sodium: 141 mmol/L (ref 134–144)
TOTAL PROTEIN: 7 g/dL (ref 6.0–8.5)

## 2018-07-15 LAB — CBC WITH DIFFERENTIAL/PLATELET
BASOS: 0 %
Basophils Absolute: 0 10*3/uL (ref 0.0–0.2)
EOS (ABSOLUTE): 0.1 10*3/uL (ref 0.0–0.4)
EOS: 2 %
Hematocrit: 36.3 % (ref 34.0–46.6)
Hemoglobin: 12.2 g/dL (ref 11.1–15.9)
IMMATURE GRANS (ABS): 0 10*3/uL (ref 0.0–0.1)
Immature Granulocytes: 0 %
LYMPHS: 35 %
Lymphocytes Absolute: 2 10*3/uL (ref 0.7–3.1)
MCH: 31 pg (ref 26.6–33.0)
MCHC: 33.6 g/dL (ref 31.5–35.7)
MCV: 92 fL (ref 79–97)
MONOS ABS: 0.4 10*3/uL (ref 0.1–0.9)
Monocytes: 8 %
Neutrophils Absolute: 3.1 10*3/uL (ref 1.4–7.0)
Neutrophils: 55 %
PLATELETS: 220 10*3/uL (ref 150–450)
RBC: 3.94 x10E6/uL (ref 3.77–5.28)
RDW: 11.7 % — AB (ref 12.3–15.4)
WBC: 5.8 10*3/uL (ref 3.4–10.8)

## 2018-07-15 LAB — VITAMIN D 25 HYDROXY (VIT D DEFICIENCY, FRACTURES): VIT D 25 HYDROXY: 45.2 ng/mL (ref 30.0–100.0)

## 2018-07-15 LAB — HIV ANTIBODY (ROUTINE TESTING W REFLEX): HIV SCREEN 4TH GENERATION: NONREACTIVE

## 2018-07-15 LAB — TSH: TSH: 1.17 u[IU]/mL (ref 0.450–4.500)

## 2018-07-15 LAB — HEPATITIS C ANTIBODY

## 2018-07-15 NOTE — Telephone Encounter (Signed)
Pt advised.   Thanks,   -Laura  

## 2018-07-15 NOTE — Telephone Encounter (Signed)
-----   Message from Margaretann LovelessJennifer M Burnette, [place]PA-C sent at 07/15/2018 10:13 AM EDT ----- All labs are within normal limits and stable.  Thanks! -JB

## 2018-07-23 ENCOUNTER — Other Ambulatory Visit: Payer: Self-pay

## 2018-07-23 ENCOUNTER — Ambulatory Visit
Admission: EM | Admit: 2018-07-23 | Discharge: 2018-07-23 | Disposition: A | Discharge status: Home / Self Care | Payer: Managed Care, Other (non HMO) | Attending: Family Medicine | Admitting: Family Medicine

## 2018-07-23 DIAGNOSIS — B09 Unspecified viral infection characterized by skin and mucous membrane lesions: Secondary | ICD-10-CM

## 2018-07-23 DIAGNOSIS — R21 Rash and other nonspecific skin eruption: Secondary | ICD-10-CM | POA: Diagnosis not present

## 2018-07-23 NOTE — Discharge Instructions (Signed)
Over the counter benadryl, zyrtec,  cortisone creamas needed for itching

## 2018-07-23 NOTE — ED Triage Notes (Signed)
Patient complains of rash on hands and feet. Patient states that she thinks this may be from taking HCTZ. Patient states that she noticed this on her hands last night. Patient states that she took a zyrtec and benadryl today.

## 2018-07-23 NOTE — ED Provider Notes (Signed)
[facility]    CSN: [id number] Arrival date & time: 07/23/18  1921     History   Chief Complaint Chief Complaint  Patient presents with  . Rash    HPI VERYL ABRIL is a 52 y.o. female.   52 yo female with a c/o rash on her hands and feet since yesterday. States rash is slightly itchy and she took some zyrtec and benadryl today. Patient wondering if this could be from the HCTZ that she took recently. Denies rash on any other area. Denies any wheezing, shortness of breath, swelling.   The history is provided by the patient.  Rash    Past Medical History:  Diagnosis Date  . Family history of adverse reaction to anesthesia    mother had problems  . GERD (gastroesophageal reflux disease)   . Hypertension     Patient Active Problem List   Diagnosis Date Noted  . Endometrial polyp 01/08/2018  . Postmenopausal bleeding 12/31/2017  . B12 deficiency 04/12/2017  . Essential hypertension 01/06/2016  . Chronic sinusitis 05/02/2015  . Abnormal ECG 04/07/2015  . 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 Acid reflux 04/07/2015  . Low back pain with sciatica 04/07/2015  . Adiposity 04/07/2015  . Hemorrhage, postmenopausal 04/07/2015  . Pain 04/07/2015  . Avitaminosis D 04/07/2015    Past Surgical History:  Procedure Laterality Date  . BIOPSY ENDOMETRIAL  01/21/2014  . BREAST CYST EXCISION Right 1983  . DILATATION & CURETTAGE/HYSTEROSCOPY WITH MYOSURE N/A 03/04/2018   Procedure: DILATATION & CURETTAGE/HYSTEROSCOPY;  Surgeon: Conard Novak, MD;  Location: [facility] ORS;  Service: Gynecology;  Laterality: N/A;  . TUBAL LIGATION      OB History    Gravida  2   Para  2   Term  2   Preterm      AB      Living  2     SAB      TAB      Ectopic      Multiple      Live Births  2            Home Medications    Prior to Admission medications   Medication Sig Start Date End Date Taking? Authorizing Provider  amLODipine (NORVASC) 5 MG tablet Take 1  tablet (5 mg total) by mouth daily. 07/14/18  Yes Margaretann Loveless, PA-C  Ascorbic Acid (VITAMIN C) 1000 MG tablet Take 2,000 mg by mouth daily.   Yes [provider]  cyclobenzaprine (FLEXERIL) 5 MG tablet Take 1 tablet (5 mg total) by mouth at bedtime. Patient taking differently: Take 5 mg by mouth daily as needed for muscle spasms.  05/24/17  Yes Joycelyn Man M, PA-C  fluticasone (FLONASE) 50 MCG/ACT nasal spray Place 2 sprays into both nostrils daily. Patient taking differently: Place 2 sprays into both nostrils daily as needed for allergies.  05/13/17  Yes Margaretann Loveless, PA-C  ibuprofen (ADVIL,MOTRIN) 800 MG tablet Take 1 tablet (800 mg total) by mouth every 8 (eight) hours as needed (for pain.). 07/14/18  Yes Margaretann Loveless, PA-C  loratadine (CLARITIN) 10 MG tablet Take 10 mg by mouth daily.   Yes [provider]  phentermine (ADIPEX-P) 37.5 MG tablet Take 1 tablet (37.5 mg total) by mouth daily before breakfast. 07/14/18  Yes Burnette, Alessandra Bevels, PA-C  chlorthalidone (HYGROTON) 25 MG tablet Take 1 tablet (25 mg total) by mouth every other day. In the morning 07/14/18   Margaretann Loveless, PA-C  Cholecalciferol (VITAMIN D-3) 5000 units TABS Take 5,000 Units by mouth daily.    [provider]    Family History Family History  Problem Relation Age of Onset  . Hypertension Mother   . Diabetes Mother   . Breast cancer Mother 106  . Pneumonia Paternal Grandmother   . Leukemia Paternal Grandfather   . Breast cancer Maternal Grandmother 64  . Heart attack Paternal Uncle   . Kidney cancer Maternal Grandfather     Social History Social History   Tobacco Use  . Smoking status: Never Smoker  . Smokeless tobacco: Never Used  Substance Use Topics  . Alcohol use: No    Alcohol/week: 0.0 standard drinks  . Drug use: No     Allergies   Lisinopril   Review of Systems Review of Systems  Skin: Positive for rash.     Physical  Exam Triage Vital Signs ED Triage Vitals  Enc Vitals Group     BP 07/23/18 1937 (!) 142/102     Pulse Rate 07/23/18 1937 97     Resp 07/23/18 1937 18     Temp 07/23/18 1937 98.3 F (36.8 C)     Temp Source 07/23/18 1937 Oral     SpO2 07/23/18 1937 100 %     Weight 07/23/18 1935 225 lb (102.1 kg)     Height 07/23/18 1935 5\' 5"  (1.651 m)     Head Circumference --      Peak Flow --      Pain Score 07/23/18 1935 0     Pain Loc --      Pain Edu? --      Excl. in GC? --    No data found.  Updated Vital Signs BP (!) 142/102 (BP Location: Left Arm)   Pulse 97   Temp 98.3 F (36.8 C) (Oral)   Resp 18   Ht 5\' 5"  (1.651 m)   Wt 102.1 kg   LMP 11/19/2015   SpO2 100%   BMI 37.44 kg/m   Visual Acuity Right Eye Distance:   Left Eye Distance:   Bilateral Distance:    Right Eye Near:   Left Eye Near:    Bilateral Near:     Physical Exam  Constitutional: She appears well-developed and well-nourished. No distress.  Skin: Rash (on palms of hands and on feet) noted. Rash is papular. She is not diaphoretic. There is erythema.  Nursing note and vitals reviewed.    UC Treatments / Results  Labs (all labs ordered are listed, but only abnormal results are displayed) Labs Reviewed - No data to display  EKG None  Radiology No results found.  Procedures Procedures (including critical care time)  Medications Ordered in UC Medications - No data to display  Initial Impression / Assessment and Plan / UC Course  I have reviewed the triage vital signs and the nursing notes.  Pertinent labs & imaging results that were available during my care of the patient were reviewed by me and considered in my medical decision making (see chart for details).      Final Clinical Impressions(s) / UC Diagnoses   Final diagnoses:  Viral rash     Discharge Instructions     Over the counter benadryl, zyrtec,  cortisone creamas needed for itching    ED Prescriptions    None       1. diagnosis reviewed with patient 2. Recommend supportive treatment as above 3. Follow-up prn if symptoms worsen or don't improve  Controlled Substance Prescriptions [place] Controlled Substance Registry consulted? Not Applicable   Payton Mccallum, MD 07/23/18 2036

## 2018-07-24 ENCOUNTER — Encounter: Payer: Self-pay | Admitting: Physician Assistant

## 2018-07-24 ENCOUNTER — Ambulatory Visit: Payer: Managed Care, Other (non HMO) | Admitting: Physician Assistant

## 2018-07-24 VITALS — BP 140/90 | HR 88 | Temp 97.8°F | Resp 16 | Wt 222.6 lb

## 2018-07-24 DIAGNOSIS — R600 Localized edema: Secondary | ICD-10-CM | POA: Diagnosis not present

## 2018-07-24 DIAGNOSIS — L282 Other prurigo: Secondary | ICD-10-CM

## 2018-07-24 MED ORDER — FUROSEMIDE 20 MG PO TABS: 20.0000 mg | ORAL_TABLET | Freq: Every day | ORAL | 3 refills | Status: DC | PRN

## 2018-07-24 MED ORDER — TRIAMCINOLONE ACETONIDE 0.1 % EX CREA: 1.0000 "application " | TOPICAL_CREAM | Freq: Two times a day (BID) | CUTANEOUS | 0 refills | Status: DC

## 2018-07-24 NOTE — Patient Instructions (Signed)
Furosemide tablets What is this medicine? FUROSEMIDE (fyoor [place] se mide) is a diuretic. It helps you make more urine and to lose salt and excess water from your body. This medicine is used to treat high blood pressure, and edema or swelling from heart, kidney, or liver disease. This medicine may be used for other purposes; ask your health care provider or pharmacist if you have questions. COMMON BRAND NAME(S): Active-Medicated Specimen Kit, Delone, Diuscreen, Lasix, RX Specimen Collection Kit, Specimen Collection Kit, URINX Medicated Specimen Collection What should I tell my health care provider before I take this medicine? They need to know if you have any of these conditions: -abnormal blood electrolytes -diarrhea or vomiting -gout -heart disease -kidney disease, small amounts of urine, or difficulty passing urine -liver disease -thyroid disease -an unusual or allergic reaction to furosemide, sulfa drugs, other medicines, foods, dyes, or preservatives -pregnant or trying to get pregnant -breast-feeding How should I use this medicine? Take this medicine by mouth with a glass of water. Follow the directions on the prescription label. You may take this medicine with or without food. If it upsets your stomach, take it with food or milk. Do not take your medicine more often than directed. Remember that you will need to pass more urine after taking this medicine. Do not take your medicine at a time of day that will cause you problems. Do not take at bedtime. Talk to your pediatrician regarding the use of this medicine in children. While this drug may be prescribed for selected conditions, precautions do apply. Overdosage: If you think you have taken too much of this medicine contact a poison control center or emergency room at once. NOTE: This medicine is only for you. Do not share this medicine with others. What if I miss a dose? If you miss a dose, take it as soon as you can. If it is almost time  for your next dose, take only that dose. Do not take double or extra doses. What may interact with this medicine? -aspirin and aspirin-like medicines -certain antibiotics -chloral hydrate -cisplatin -cyclosporine -digoxin -diuretics -laxatives -lithium -medicines for blood pressure -medicines that relax muscles for surgery -methotrexate -NSAIDs, medicines for pain and inflammation like ibuprofen, naproxen, or indomethacin -phenytoin -steroid medicines like prednisone or cortisone -sucralfate -thyroid hormones This list may not describe all possible interactions. Give your health care provider a list of all the medicines, herbs, non-prescription drugs, or dietary supplements you use. Also tell them if you smoke, drink alcohol, or use illegal drugs. Some items may interact with your medicine. What should I watch for while using this medicine? Visit your doctor or health care professional for regular checks on your progress. Check your blood pressure regularly. Ask your doctor or health care professional what your blood pressure should be, and when you should contact him or her. If you are a diabetic, check your blood sugar as directed. You may need to be on a special diet while taking this medicine. Check with your doctor. Also, ask how many glasses of fluid you need to drink a day. You must not get dehydrated. You may get drowsy or dizzy. Do not drive, use machinery, or do anything that needs mental alertness until you know how this drug affects you. Do not stand or sit up quickly, especially if you are an older patient. This reduces the risk of dizzy or fainting spells. Alcohol can make you more drowsy and dizzy. Avoid alcoholic drinks. This medicine can make you more sensitive  to the sun. Keep out of the sun. If you cannot avoid being in the sun, wear protective clothing and use sunscreen. Do not use sun lamps or tanning beds/booths. What side effects may I notice from receiving this  medicine? Side effects that you should report to your doctor or health care professional as soon as possible: -blood in urine or stools -dry mouth -fever or chills -hearing loss or ringing in the ears -irregular heartbeat -muscle pain or weakness, cramps -skin rash -stomach upset, pain, or nausea -tingling or numbness in the hands or feet -unusually weak or tired -vomiting or diarrhea -yellowing of the eyes or skin Side effects that usually do not require medical attention (report to your doctor or health care professional if they continue or are bothersome): -headache -loss of appetite -unusual bleeding or bruising This list may not describe all possible side effects. Call your doctor for medical advice about side effects. You may report side effects to FDA at 1-800-FDA-1088. Where should I keep my medicine? Keep out of the reach of children. Store at room temperature between 15 and 30 degrees C (59 and 86 degrees F). Protect from light. Throw away any unused medicine after the expiration date. NOTE: This sheet is a summary. It may not cover all possible information. If you have questions about this medicine, talk to your doctor, pharmacist, or health care provider.  2018 Elsevier/Gold Standard (2014-12-29 13:49:50)  

## 2018-07-24 NOTE — Progress Notes (Signed)
Patient: Ashley Ballard Female    DOB: [birth year]   52 y.o.   MRN: [id number] Visit Date: 07/24/2018  Today's Provider: Margaretann Loveless, PA-C   Chief Complaint  Patient presents with  . Rash  . Follow-up   Subjective:    Rash  This is a new problem. The current episode started in the past 7 days (started 2 days ago). The affected locations include the left hand, right hand, left foot and right foot ((Palms of the hand and on feet)). The rash is characterized by itchiness. Past treatments include antihistamine.   ED Follow-up: Patient was seen at urgent care for the same reason on 10/02. Diagnosed with Viral Rash.Patient wondering if this could be from the HCTZ.,she states that she has been taking hctz for the past month on and off because she had stopped taking it but restarted it. Reports she has some petechia on her feet. She also reports that the rash on her hand itches when it gets hot.    Allergies  Allergen Reactions  . Lisinopril Itching    Mouth Itching     Current Outpatient Medications:  .  amLODipine (NORVASC) 5 MG tablet, Take 1 tablet (5 mg total) by mouth daily., Disp: 90 tablet, Rfl: 3 .  Ascorbic Acid (VITAMIN C) 1000 MG tablet, Take 2,000 mg by mouth daily., Disp: , Rfl:  .  chlorthalidone (HYGROTON) 25 MG tablet, Take 1 tablet (25 mg total) by mouth every other day. In the morning, Disp: 90 tablet, Rfl: 1 .  Cholecalciferol (VITAMIN D-3) 5000 units TABS, Take 5,000 Units by mouth daily., Disp: , Rfl:  .  cyclobenzaprine (FLEXERIL) 5 MG tablet, Take 1 tablet (5 mg total) by mouth at bedtime. (Patient taking differently: Take 5 mg by mouth daily as needed for muscle spasms. ), Disp: 90 tablet, Rfl: 1 .  fluticasone (FLONASE) 50 MCG/ACT nasal spray, Place 2 sprays into both nostrils daily. (Patient taking differently: Place 2 sprays into both nostrils daily as needed for allergies. ), Disp: 16 g, Rfl: 11 .  ibuprofen (ADVIL,MOTRIN) 800 MG tablet, Take 1  tablet (800 mg total) by mouth every 8 (eight) hours as needed (for pain.)., Disp: 180 tablet, Rfl: 1 .  loratadine (CLARITIN) 10 MG tablet, Take 10 mg by mouth daily., Disp: , Rfl:  .  phentermine (ADIPEX-P) 37.5 MG tablet, Take 1 tablet (37.5 mg total) by mouth daily before breakfast., Disp: 30 tablet, Rfl: 0  Review of Systems  Constitutional: Negative.   Respiratory: Negative.   Cardiovascular: Negative.   Skin: Positive for rash.  Neurological: Negative.     Social History   Tobacco Use  . Smoking status: Never Smoker  . Smokeless tobacco: Never Used  Substance Use Topics  . Alcohol use: No    Alcohol/week: 0.0 standard drinks   Objective:   BP 140/90 (BP Location: Left Arm, Patient Position: Sitting, Cuff Size: Large)   Pulse 88   Temp 97.8 F (36.6 C) (Oral)   Resp 16   Wt 222 lb 9.6 oz (101 kg)   LMP 11/19/2015   BMI 37.04 kg/m  Vitals:   07/24/18 1615  BP: 140/90  Pulse: 88  Resp: 16  Temp: 97.8 F (36.6 C)  TempSrc: Oral  Weight: 222 lb 9.6 oz (101 kg)     Physical Exam  Constitutional: She appears well-developed and well-nourished. No distress.  HENT:  Mouth/Throat: Uvula is midline, oropharynx is clear and moist and mucous  membranes are normal. No oral lesions.  Neck: Normal range of motion. Neck supple. No JVD present. No tracheal deviation present. No thyromegaly present.  Cardiovascular: Normal rate, regular rhythm and normal heart sounds. Exam reveals no gallop and no friction rub.  No murmur heard. Pulmonary/Chest: Effort normal and breath sounds normal. No respiratory distress. She has no wheezes. She has no rales.  Lymphadenopathy:    She has no cervical adenopathy.  Skin: She is not diaphoretic.     Vitals reviewed.       Assessment & Plan:     1. Localized edema Will change from thiazide diuretics to furosemide as below due to suspected drug reaction to HCTZ. Will have her call in 4-6 weeks to recheck potassium. She is in agreement.   - furosemide (LASIX) 20 MG tablet; Take 1 tablet (20 mg total) by mouth daily as needed for fluid.  Dispense: 30 tablet; Refill: 3  2. Pruritic rash Stable. Diagnosis pulled for medication refill. Continue current medical treatment plan. - triamcinolone cream (KENALOG) 0.1 %; Apply 1 application topically 2 (two) times daily.  Dispense: 30 g; Refill: 0       Margaretann Loveless, PA-C  [facility]

## 2018-09-01 ENCOUNTER — Other Ambulatory Visit: Payer: Self-pay | Admitting: Physician Assistant

## 2018-09-01 DIAGNOSIS — G8929 Other chronic pain: Secondary | ICD-10-CM

## 2018-09-01 DIAGNOSIS — M5440 Lumbago with sciatica, unspecified side: Principal | ICD-10-CM

## 2018-11-24 ENCOUNTER — Encounter: Payer: Self-pay | Admitting: Physician Assistant

## 2018-11-24 DIAGNOSIS — I10 Essential (primary) hypertension: Secondary | ICD-10-CM

## 2018-11-25 ENCOUNTER — Telehealth: Payer: Self-pay | Admitting: Physician Assistant

## 2018-11-25 NOTE — Telephone Encounter (Signed)
Pt wants to know if it is time for her to get a tetanus shot.  Pt's callback is [phone]  Thanks teri

## 2018-11-25 NOTE — Telephone Encounter (Signed)
We don't have Td told patient we will give her a call back once we receive them.

## 2018-11-29 LAB — CBC WITH DIFFERENTIAL/PLATELET
BASOS ABS: 0 10*3/uL (ref 0.0–0.2)
BASOS: 0 %
EOS (ABSOLUTE): 0.1 10*3/uL (ref 0.0–0.4)
Eos: 2 %
HEMATOCRIT: 35 % (ref 34.0–46.6)
Hemoglobin: 12.1 g/dL (ref 11.1–15.9)
IMMATURE GRANS (ABS): 0 10*3/uL (ref 0.0–0.1)
Immature Granulocytes: 0 %
LYMPHS: 37 %
Lymphocytes Absolute: 1.9 10*3/uL (ref 0.7–3.1)
MCH: 31.5 pg (ref 26.6–33.0)
MCHC: 34.6 g/dL (ref 31.5–35.7)
MCV: 91 fL (ref 79–97)
Monocytes Absolute: 0.4 10*3/uL (ref 0.1–0.9)
Monocytes: 9 %
NEUTROS PCT: 52 %
Neutrophils Absolute: 2.6 10*3/uL (ref 1.4–7.0)
RBC: 3.84 x10E6/uL (ref 3.77–5.28)
RDW: 11.8 % (ref 11.7–15.4)
WBC: 5 10*3/uL (ref 3.4–10.8)

## 2018-11-29 LAB — BASIC METABOLIC PANEL
BUN/Creatinine Ratio: 25 — ABNORMAL HIGH (ref 9–23)
BUN: 15 mg/dL (ref 6–24)
CHLORIDE: 100 mmol/L (ref 96–106)
CO2: 22 mmol/L (ref 20–29)
Calcium: 9.3 mg/dL (ref 8.7–10.2)
Creatinine, Ser: 0.61 mg/dL (ref 0.57–1.00)
GFR calc non Af Amer: 103 mL/min/{1.73_m2} (ref >59)
GFR, EST AFRICAN AMERICAN: 119 mL/min/{1.73_m2} (ref >59)
GLUCOSE: 86 mg/dL (ref 65–99)
Potassium: 4 mmol/L (ref 3.5–5.2)
SODIUM: 141 mmol/L (ref 134–144)

## 2018-12-01 ENCOUNTER — Telehealth: Payer: Self-pay

## 2018-12-01 ENCOUNTER — Encounter: Payer: Self-pay | Admitting: Physician Assistant

## 2018-12-01 NOTE — Telephone Encounter (Signed)
-----   Message from Margaretann Loveless, PA-C sent at 12/01/2018  7:28 AM EST ----- Labs are normal.

## 2018-12-01 NOTE — Telephone Encounter (Signed)
Pt returned call again ° °teri °

## 2018-12-01 NOTE — Telephone Encounter (Signed)
Pt returned missed call. Please call pt back at [phone].  Thanks, [employer]

## 2018-12-01 NOTE — Telephone Encounter (Signed)
LVMTRC 

## 2018-12-01 NOTE — Telephone Encounter (Signed)
Returned patient call and she was advised. 

## 2018-12-18 ENCOUNTER — Encounter: Payer: Self-pay | Admitting: Physician Assistant

## 2018-12-18 DIAGNOSIS — Z20828 Contact with and (suspected) exposure to other viral communicable diseases: Secondary | ICD-10-CM

## 2018-12-19 MED ORDER — OSELTAMIVIR PHOSPHATE 75 MG PO CAPS: 75.0000 mg | ORAL_CAPSULE | Freq: Every day | ORAL | 0 refills | Status: DC

## 2018-12-26 ENCOUNTER — Ambulatory Visit (INDEPENDENT_AMBULATORY_CARE_PROVIDER_SITE_OTHER): Payer: Managed Care, Other (non HMO) | Admitting: Physician Assistant

## 2018-12-26 ENCOUNTER — Encounter: Payer: Self-pay | Admitting: Physician Assistant

## 2018-12-26 VITALS — Temp 98.6°F

## 2018-12-26 DIAGNOSIS — Z23 Encounter for immunization: Secondary | ICD-10-CM

## 2018-12-26 NOTE — Progress Notes (Signed)
Patient tolerated Td injection well.

## 2019-01-14 ENCOUNTER — Other Ambulatory Visit: Payer: Self-pay | Admitting: Physician Assistant

## 2019-01-14 DIAGNOSIS — R600 Localized edema: Secondary | ICD-10-CM

## 2019-01-14 NOTE — Telephone Encounter (Signed)
Please review

## 2019-02-28 ENCOUNTER — Encounter: Payer: Self-pay | Admitting: Physician Assistant

## 2019-03-03 ENCOUNTER — Encounter: Payer: Self-pay | Admitting: Physician Assistant

## 2019-03-03 DIAGNOSIS — R600 Localized edema: Secondary | ICD-10-CM

## 2019-03-06 LAB — BASIC METABOLIC PANEL
BUN/Creatinine Ratio: 18 (ref 9–23)
BUN: 14 mg/dL (ref 6–24)
CO2: 21 mmol/L (ref 20–29)
Calcium: 9.4 mg/dL (ref 8.7–10.2)
Chloride: 105 mmol/L (ref 96–106)
Creatinine, Ser: 0.76 mg/dL (ref 0.57–1.00)
GFR calc Af Amer: 102 mL/min/{1.73_m2} (ref >59)
GFR calc non Af Amer: 89 mL/min/{1.73_m2} (ref >59)
Glucose: 84 mg/dL (ref 65–99)
Potassium: 4.6 mmol/L (ref 3.5–5.2)
Sodium: 140 mmol/L (ref 134–144)

## 2019-03-25 NOTE — Progress Notes (Signed)
Patient: Ashley Ballard Female    DOB: [birth year]   53 y.o.   MRN: [id number] Visit Date: 03/26/2019  Today's Provider: Mar Daring, PA-C   Chief Complaint  Patient presents with  . Rash   Subjective:     HPI   Patient states she has had petechiae on both feet for 2 weeks. She has had this before and it was felt to be secondary to HCTZ. This was discontinued and she started to notice swelling, thus chlorthalidone was started. She again developed the petechiae and this medication was also discontinued. Petechiae improved and has not been present for a few months now. She has been stable on Furosemide for edema without complications. Petechiae returned over last 2 weeks and has been progressing on her feet, lower legs and hands. Felt sun exposure may be effecting.   Allergies  Allergen Reactions  . Lisinopril Itching    Mouth Itching     Current Outpatient Medications:  .  amLODipine (NORVASC) 5 MG tablet, Take 1 tablet (5 mg total) by mouth daily., Disp: 90 tablet, Rfl: 3 .  Ascorbic Acid (VITAMIN C) 1000 MG tablet, Take 2,000 mg by mouth daily., Disp: , Rfl:  .  Cholecalciferol (VITAMIN D-3) 5000 units TABS, Take 5,000 Units by mouth daily., Disp: , Rfl:  .  cyclobenzaprine (FLEXERIL) 5 MG tablet, Take 1 tablet (5 mg total) by mouth at bedtime. (Patient taking differently: Take 5 mg by mouth daily as needed for muscle spasms. ), Disp: 90 tablet, Rfl: 1 .  fluticasone (FLONASE) 50 MCG/ACT nasal spray, Place 2 sprays into both nostrils daily. (Patient taking differently: Place 2 sprays into both nostrils daily as needed for allergies. ), Disp: 16 g, Rfl: 11 .  furosemide (LASIX) 20 MG tablet, TAKE 1 TABLET(20 MG) BY MOUTH DAILY AS NEEDED FOR FLUID RETENTION, Disp: 90 tablet, Rfl: 1 .  ibuprofen (ADVIL,MOTRIN) 800 MG tablet, TAKE 1 TABLET BY MOUTH  EVERY 8 HOURS AS NEEDED FOR PAIN ., Disp: 270 tablet, Rfl: 1 .  loratadine (CLARITIN) 10 MG tablet, Take 10 mg by mouth  daily., Disp: , Rfl:  .  triamcinolone cream (KENALOG) 0.1 %, Apply 1 application topically 2 (two) times daily., Disp: 30 g, Rfl: 0 .  oseltamivir (TAMIFLU) 75 MG capsule, Take 1 capsule (75 mg total) by mouth daily. (Patient not taking: Reported on 03/26/2019), Disp: 10 capsule, Rfl: 0 .  phentermine (ADIPEX-P) 37.5 MG tablet, Take 1 tablet (37.5 mg total) by mouth daily before breakfast. (Patient not taking: Reported on 03/26/2019), Disp: 30 tablet, Rfl: 0   Review of Systems  Constitutional: Negative for appetite change and chills.  Respiratory: Negative for chest tightness.   Cardiovascular: Negative for chest pain and palpitations.  Gastrointestinal: Negative for abdominal pain and nausea.  Neurological: Negative for dizziness and weakness.  Hematological:       Petechiae    Social History   Tobacco Use  . Smoking status: Never Smoker  . Smokeless tobacco: Never Used  Substance Use Topics  . Alcohol use: No    Alcohol/week: 0.0 standard drinks      Objective:   BP 136/90 (BP Location: Right Arm, Patient Position: Sitting, Cuff Size: Large)   Pulse 92   Temp 98.2 F (36.8 C) (Oral)   Resp 16   Ht _0  (1.651 m)   Wt 221 lb (100.2 kg)   LMP 11/19/2015   SpO2 99%   BMI 36.78 kg/m  Vitals:  03/26/19 0948  BP: 136/90  Pulse: 92  Resp: 16  Temp: 98.2 F (36.8 C)  TempSrc: Oral  SpO2: 99%  Weight: 221 lb (100.2 kg)  Height: _0  (1.651 m)     Physical Exam Vitals signs reviewed.  Constitutional:      General: She is not in acute distress.    Appearance: Normal appearance. She is well-developed. She is not ill-appearing or diaphoretic.  Neck:     Musculoskeletal: Normal range of motion and neck supple.     Thyroid: No thyromegaly.     Vascular: No JVD.     Trachea: No tracheal deviation.  Cardiovascular:     Rate and Rhythm: Normal rate and regular rhythm.     Pulses: Normal pulses.     Heart sounds: Normal heart sounds. No murmur. No friction rub. No  gallop.   Pulmonary:     Effort: Pulmonary effort is normal. No respiratory distress.     Breath sounds: Normal breath sounds. No wheezing or rales.  Lymphadenopathy:     Cervical: No cervical adenopathy.  Skin:    Capillary Refill: Capillary refill takes less than 2 seconds.     Findings: Petechiae (on hands, feet and lower extremities bilaterally) present.  Neurological:     General: No focal deficit present.     Mental Status: She is alert. Mental status is at baseline.     Cranial Nerves: No cranial nerve deficit.     Motor: No weakness.     Gait: Gait normal.        Assessment & Plan    1. Petechiae Labs checked as below to r/o autoimmune source. Labs were remarkable for platelet aggregation (multiple times checked and this occurred every time) and positive ANA. Referrals have been placed for rheumatology and hematology for further evaluation. Patient agrees.  - CBC w/Diff/Platelet - C-reactive protein - Sed Rate (ESR) - ANA,IFA RA Diag Pnl w/rflx Tit/Patn - ANCA Titers ( [facility]) - C3 and C4 - Cryoglobulin - Ambulatory referral to Rheumatology - Ambulatory referral to Hematology  2. Class 2 severe obesity due to excess calories with serious comorbidity and body mass index (BMI) of 36.0 to 36.9 in adult [facility]) Counseled patient on healthy lifestyle modifications including dieting and exercise. Walking for exercise.   3. Positive ANA (antinuclear antibody) See above medical treatment plan. 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 - Ambulatory referral to Hematology     Mar Daring, PA-C  [facility]

## 2019-03-26 ENCOUNTER — Ambulatory Visit: Payer: Managed Care, Other (non HMO) | Admitting: Physician Assistant

## 2019-03-26 ENCOUNTER — Other Ambulatory Visit: Payer: Self-pay

## 2019-03-26 ENCOUNTER — Encounter: Payer: Self-pay | Admitting: Physician Assistant

## 2019-03-26 VITALS — BP 132/84 | HR 92 | Temp 98.2°F | Resp 16 | Ht 65.0 in | Wt 221.0 lb

## 2019-03-26 DIAGNOSIS — R768 Other specified abnormal immunological findings in serum: Secondary | ICD-10-CM

## 2019-03-26 DIAGNOSIS — D691 Qualitative platelet defects: Secondary | ICD-10-CM

## 2019-03-26 DIAGNOSIS — Z6836 Body mass index (BMI) 36.0-36.9, adult: Secondary | ICD-10-CM

## 2019-03-26 DIAGNOSIS — R233 Spontaneous ecchymoses: Secondary | ICD-10-CM

## 2019-03-26 NOTE — Patient Instructions (Signed)
Vasculitis  Vasculitis is inflammation of the blood vessels. With vasculitis, the blood vessels can become thick, narrow, scarred, or weak. Enough blood may not be able to flow through them. This can cause damage to the muscles, kidneys, lungs, brain, and other parts of the body. There are many types of vasculitis. The different types may affect different kinds of blood vessels or different areas of the body. Some types last only a short time, while others last a long time. What are the causes? The exact cause of this condition is not known. However, vasculitis can develop when the body's defense system (immune system) attacks its own blood vessels. This attack can be caused by:  An infection.  An immune system disease, such as lupus, rheumatoid arthritis, or scleroderma.  An allergic reaction to a medicine.  A cancer that affects blood cells, such as leukemia or lymphoma. What increases the risk? The following factors may make you more likely to develop this condition:  Being a smoker.  Being under stress.  Having a physical injury. What are the signs or symptoms? Symptoms of this condition depend on the type of vasculitis that you have. Symptoms that are common to all types of vasculitis include:  Fever.  Poor appetite.  Weight loss.  Feeling very tired (fatigue).  Having aches and pains.  Weakness.  Numbness in an area of your body. Symptoms for specific types of vasculitis include:  Skin problems, such as sores, spots, or rashes.  Trouble seeing.  Trouble breathing.  Coughing up blood.  Blood in your urine.  Headaches.  Stomach pain.  Stuffy or bloody nose. How is this diagnosed? This condition may be diagnosed based on:  Your symptoms.  A physical exam. You may also have tests, including:  Blood tests.  A urine test.  A biopsy of a blood vessel.  A test to measure the electrical signals moving through nerves (nerve conduction study).   Imaging tests, such as: ? X-rays. ? CT scan. ? Ultrasound. ? MRI. ? Angiogram. How is this treated? Treatment for this condition will depend on the type of vasculitis that you have and how severe the symptoms are. Sometimes treatment is not needed. Treatment often includes:  Medicines.  Physical therapy or occupational therapy. This helps strengthen muscles that were weakened by the disease. You will need to see your health care provider while you are being treated. During follow-up visits, your health care provider may:  Perform blood tests and bone density tests.  Check your blood pressure and blood sugar.  Check for side effects of any medicines you are taking. Vasculitis cannot always be cured. Sometimes symptoms go away but the disease does not (the disease goes into remission). If symptoms return, increased treatment may be needed. Follow these instructions at home:  Take over-the-counter and prescription medicines only as told by your health care provider.  Exercise as directed. Talk with your health care provider about what exercises are okay for you to do. Exercises that increase your heart rate (aerobic exercise), such as walking, are usually recommended. Aerobic exercise helps control your blood pressure and prevent bone loss.  Follow a healthy diet. Make sure your diet includes fruits, vegetables, whole grains, and healthy sources of protein.  Learn as much as you can about vasculitis, and consider joining a support group. ? Talk to other people who have your condition. This may help you cope with the illness. ? Talk with your health care provider if you feel stressed, anxious, or   depressed.  Keep all follow-up visits as told by your health care provider. This is important. Contact a health care provider if:  Your symptoms return or you have new symptoms.  Your fever, fatigue, headache, or weight loss gets worse.  You have signs of infection, such as redness,  swelling, tenderness, warmth, or a new fever.  Your pain does not go away, even after you take pain medicine.  Your nose bleeds. Get help right away if:  Your vision gets worse.  You have chest pain or stomach pain.  You have trouble breathing.  One side of your face or body suddenly becomes weak or numb.  There is blood in your urine. Summary  Vasculitis is inflammation of the blood vessels that may cause them to become thick, narrow, scarred, or weak. Enough blood may not be able to flow through them. This can cause damage throughout your body.  The exact cause of this condition is not known. However, vasculitis can develop when the body's immune system attacks its own blood vessels. This attack may be caused by an infection, an immune system disease, an allergic reaction to a medicine, or a cancer that affects blood cells, such as leukemia or lymphoma.  Vasculitis cannot always be cured. Sometimes symptoms go away but the disease does not (the disease goes into remission). If symptoms return, increased treatment may be needed. This information is not intended to replace advice given to you by your health care provider. Make sure you discuss any questions you have with your health care provider. Document Released: 08/04/2009 Document Revised: 10/29/2017 Document Reviewed: 10/29/2017 Elsevier Interactive Patient Education  2019 Elsevier Inc.  

## 2019-03-31 ENCOUNTER — Encounter: Payer: Self-pay | Admitting: Physician Assistant

## 2019-03-31 DIAGNOSIS — D696 Thrombocytopenia, unspecified: Secondary | ICD-10-CM

## 2019-03-31 LAB — CBC WITH DIFFERENTIAL/PLATELET
Basophils Absolute: 0 10*3/uL (ref 0.0–0.2)
Basos: 0 %
EOS (ABSOLUTE): 0.1 10*3/uL (ref 0.0–0.4)
Eos: 1 %
Hematocrit: 38 % (ref 34.0–46.6)
Hemoglobin: 13.1 g/dL (ref 11.1–15.9)
Immature Grans (Abs): 0 10*3/uL (ref 0.0–0.1)
Immature Granulocytes: 0 %
Lymphocytes Absolute: 1.7 10*3/uL (ref 0.7–3.1)
Lymphs: 26 %
MCH: 31.7 pg (ref 26.6–33.0)
MCHC: 34.5 g/dL (ref 31.5–35.7)
MCV: 92 fL (ref 79–97)
Monocytes Absolute: 0.4 10*3/uL (ref 0.1–0.9)
Monocytes: 7 %
Neutrophils Absolute: 4.2 10*3/uL (ref 1.4–7.0)
Neutrophils: 66 %
Platelets: 117 10*3/uL — ABNORMAL LOW (ref 150–450)
RBC: 4.13 x10E6/uL (ref 3.77–5.28)
RDW: 12.2 % (ref 11.7–15.4)
WBC: 6.4 10*3/uL (ref 3.4–10.8)

## 2019-03-31 LAB — FANA STAINING PATTERNS
Homogeneous Pattern: 1:320 {titer} — ABNORMAL HIGH
Speckled Pattern: 1:1280 {titer} — ABNORMAL HIGH

## 2019-03-31 LAB — CRYOGLOBULIN

## 2019-03-31 LAB — ANCA TITERS
Atypical pANCA: <1:20 {titer}
C-ANCA: <1:20 {titer}
P-ANCA: <1:20 {titer}

## 2019-03-31 LAB — ANA,IFA RA DIAG PNL W/RFLX TIT/PATN
ANA Titer 1: POSITIVE — AB
Cyclic Citrullin Peptide Ab: 6 units (ref 0–19)
Rhuematoid fact SerPl-aCnc: <10 IU/mL (ref 0.0–13.9)

## 2019-03-31 LAB — SEDIMENTATION RATE: Sed Rate: 14 mm/hr (ref 0–40)

## 2019-03-31 LAB — C3 AND C4
Complement C3, Serum: 117 mg/dL (ref 82–167)
Complement C4, Serum: 33 mg/dL (ref 14–44)

## 2019-03-31 LAB — C-REACTIVE PROTEIN: CRP: 2 mg/L (ref 0–10)

## 2019-04-01 ENCOUNTER — Telehealth: Payer: Self-pay

## 2019-04-01 NOTE — Telephone Encounter (Signed)
-----   Message from Mar Daring, [place] sent at 03/31/2019  5:15 PM EDT ----- All labs were essentially unremarkable with exception to the ANA. This is not conclusive of anything at this time, but just means there could possibly be some autoimmune process going on. I will place referral to rheumatology for further evaluation.

## 2019-04-01 NOTE — Telephone Encounter (Signed)
Patient advised as below. Patient verbalizes understanding and is in agreement with treatment plan.  

## 2019-04-02 ENCOUNTER — Telehealth: Payer: Self-pay

## 2019-04-02 LAB — CBC WITH DIFFERENTIAL/PLATELET
Basophils Absolute: 0 10*3/uL (ref 0.0–0.2)
Basos: 0 %
EOS (ABSOLUTE): 0.1 10*3/uL (ref 0.0–0.4)
Eos: 1 %
Hematocrit: 39.2 % (ref 34.0–46.6)
Hemoglobin: 12.9 g/dL (ref 11.1–15.9)
Immature Grans (Abs): 0 10*3/uL (ref 0.0–0.1)
Immature Granulocytes: 0 %
Lymphocytes Absolute: 1.9 10*3/uL (ref 0.7–3.1)
Lymphs: 28 %
MCH: 31.3 pg (ref 26.6–33.0)
MCHC: 32.9 g/dL (ref 31.5–35.7)
MCV: 95 fL (ref 79–97)
Monocytes Absolute: 0.5 10*3/uL (ref 0.1–0.9)
Monocytes: 7 %
Neutrophils Absolute: 4.3 10*3/uL (ref 1.4–7.0)
Neutrophils: 64 %
RBC: 4.12 x10E6/uL (ref 3.77–5.28)
RDW: 12.3 % (ref 11.7–15.4)
WBC: 6.8 10*3/uL (ref 3.4–10.8)

## 2019-04-02 LAB — PT AND PTT
INR: 1 (ref 0.8–1.2)
Prothrombin Time: 10.6 s (ref 9.1–12.0)
aPTT: 31 s (ref 24–33)

## 2019-04-02 NOTE — Telephone Encounter (Signed)
Patient called to speak with you about her referral. Please call [phone]

## 2019-04-03 ENCOUNTER — Encounter: Payer: Self-pay | Admitting: Physician Assistant

## 2019-04-03 ENCOUNTER — Telehealth: Payer: Self-pay

## 2019-04-03 ENCOUNTER — Telehealth: Payer: Managed Care, Other (non HMO) | Admitting: Family

## 2019-04-03 ENCOUNTER — Telehealth (INDEPENDENT_AMBULATORY_CARE_PROVIDER_SITE_OTHER): Payer: Managed Care, Other (non HMO) | Admitting: Physician Assistant

## 2019-04-03 DIAGNOSIS — R252 Cramp and spasm: Secondary | ICD-10-CM | POA: Diagnosis not present

## 2019-04-03 DIAGNOSIS — J309 Allergic rhinitis, unspecified: Secondary | ICD-10-CM

## 2019-04-03 NOTE — Progress Notes (Signed)
Greater than 5 minutes, yet less than 10 minutes of time have been spent researching, coordinating, and implementing care for this patient today.  Thank you for the details you included in the comment boxes. Those details are very helpful in determining the best course of treatment for you and help us to provide the best care.  E visit for Allergic Rhinitis We are sorry that you are not feeling well.  Here is how we plan to help!  Based on what you have shared with me it looks like you have Allergic Rhinitis.  Rhinitis is when a reaction occurs that causes nasal congestion, runny nose, sneezing, and itching.  Most types of rhinitis are caused by an inflammation and are associated with symptoms in the eyes ears or throat. There are several types of rhinitis.  The most common are acute rhinitis, which is usually caused by a viral illness, allergic or seasonal rhinitis, and nonallergic or year-round rhinitis.  Nasal allergies occur certain times of the year.  Allergic rhinitis is caused when allergens in the air trigger the release of histamine in the body.  Histamine causes itching, swelling, and fluid to build up in the fragile linings of the nasal passages, sinuses and eyelids.  An itchy nose and clear discharge are common.  I recommend the following over the counter treatments: You should take a daily dose of antihistamine and Xyzal 5 mg take 1 tablet daily  I also would recommend a nasal spray: Flonase 2 sprays into each nostril once daily  You may also benefit from eye drops such as: Systane 1-2 driops each eye twice daily as needed  HOME CARE:   You can use an over-the-counter saline nasal spray as needed  Avoid areas where there is heavy dust, mites, or molds  Stay indoors on windy days during the pollen season  Keep windows closed in home, at least in bedroom; use air conditioner.  Use high-efficiency house air filter  Keep windows closed in car, turn AC on re-circulate  Avoid  playing out with dog during pollen season  GET HELP RIGHT AWAY IF:   If your symptoms do not improve within 10 days  You become short of breath  You develop yellow or green discharge from your nose for over 3 days  You have coughing fits  MAKE SURE YOU:   Understand these instructions  Will watch your condition  Will get help right away if you are not doing well or get worse  Thank you for choosing an e-visit. Your e-visit answers were reviewed by a board certified advanced clinical practitioner to complete your personal care plan. Depending upon the condition, your plan could have included both over the counter or prescription medications. Please review your pharmacy choice. Be sure that the pharmacy you have chosen is open so that you can pick up your prescription now.  If there is a problem you may message your provider in MyChart to have the prescription routed to another pharmacy. Your safety is important to us. If you have drug allergies check your prescription carefully.  For the next 24 hours, you can use MyChart to ask questions about today's visit, request a non-urgent call back, or ask for a work or school excuse from your e-visit provider. You will get an email in the next two days asking about your experience. I hope that your e-visit has been valuable and will speed your recovery.         

## 2019-04-03 NOTE — Progress Notes (Signed)
Patient: Ashley Ballard Female    DOB: 1966-06-18   53 y.o.   MRN: [id number] Visit Date: 04/03/2019  Today's Provider: Mar Daring, PA-C   No chief complaint on file.  Subjective:    Virtual Visit via Video Note  I connected with Ashley Ballard on 38/18/29 at  4:00 PM EDT by a video enabled telemedicine application and verified that I am speaking with the correct person using two identifiers.  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 The patient expressed understanding and agreed to proceed.   HPI  Patient with c/o leg cramps.She wants to know if this could be related to her recent blood work results. Recently had labs for petechiae which revealed a positive ANA and having her platelets be most likley falsely low due to aggregation. These labs were done for persistent petechiae. Daughter has ITP.   Allergies  Allergen Reactions  . Lisinopril Itching    Mouth Itching     Current Outpatient Medications:  .  amLODipine (NORVASC) 5 MG tablet, Take 1 tablet (5 mg total) by mouth daily., Disp: 90 tablet, Rfl: 3 .  Ascorbic Acid (VITAMIN C) 1000 MG tablet, Take 2,000 mg by mouth daily., Disp: , Rfl:  .  Cholecalciferol (VITAMIN D-3) 5000 units TABS, Take 5,000 Units by mouth daily., Disp: , Rfl:  .  cyclobenzaprine (FLEXERIL) 5 MG tablet, Take 1 tablet (5 mg total) by mouth at bedtime. (Patient taking differently: Take 5 mg by mouth daily as needed for muscle spasms. ), Disp: 90 tablet, Rfl: 1 .  fluticasone (FLONASE) 50 MCG/ACT nasal spray, Place 2 sprays into both nostrils daily. (Patient taking differently: Place 2 sprays into both nostrils daily as needed for allergies. ), Disp: 16 g, Rfl: 11 .  furosemide (LASIX) 20 MG tablet, TAKE 1 TABLET(20 MG) BY MOUTH DAILY AS NEEDED FOR FLUID RETENTION, Disp: 90 tablet, Rfl: 1 .   ibuprofen (ADVIL,MOTRIN) 800 MG tablet, TAKE 1 TABLET BY MOUTH  EVERY 8 HOURS AS NEEDED FOR PAIN ., Disp: 270 tablet, Rfl: 1 .  loratadine (CLARITIN) 10 MG tablet, Take 10 mg by mouth daily., Disp: , Rfl:  .  triamcinolone cream (KENALOG) 0.1 %, Apply 1 application topically 2 (two) times daily., Disp: 30 g, Rfl: 0  Review of Systems  Constitutional: Negative.   Respiratory: Negative.   Cardiovascular: Negative.   Gastrointestinal: Negative.   Musculoskeletal: Positive for myalgias.  Neurological: Negative.     Social History   Tobacco Use  . Smoking status: Never Smoker  . Smokeless tobacco: Never Used  Substance Use Topics  . Alcohol use: No    Alcohol/week: 0.0 standard drinks      Objective:   LMP 11/19/2015  There were no vitals filed for this visit.   Physical Exam Vitals signs reviewed.  Constitutional:      General: She is not in acute distress.    Appearance: Normal appearance. She is well-developed. She is not ill-appearing.  HENT:     Head: Normocephalic and atraumatic.  Neck:     Musculoskeletal: Normal range of motion and neck supple.  Pulmonary:     Effort: Pulmonary effort is normal. No respiratory distress.  Neurological:     Mental Status: She is alert.  Psychiatric:        Mood and Affect: Mood normal.  Behavior: Behavior normal.        Thought Content: Thought content normal.        Judgment: Judgment normal.         Assessment & Plan     1. Muscle cramps Most likely secondary to dehydration and her walking more. Advised to make sure she exercises in good tennis shoes. Push fluids. May benefit by adding the no sugar gatorade on days she exercises. Can also take magnesium sulfate 250mg  at bedtime. Call if not improving.   I discussed the assessment and treatment plan with the patient. The patient was provided an opportunity to ask questions and all were answered. The patient agreed with the plan and demonstrated an understanding of the  instructions.   The patient was advised to call back or seek an in-person evaluation if the symptoms worsen or if the condition fails to improve as anticipated.  I provided 17 minutes of non-face-to-face time during this encounter.     Margaretann LovelessJennifer M Ivonne [place], PA-C  [facility]

## 2019-04-03 NOTE — Telephone Encounter (Signed)
She can do an evisit at 4pm. I have an evisit at 340, so it would be easiest to do an evisit through [place] or telephone visit instead of in office

## 2019-04-03 NOTE — Telephone Encounter (Signed)
Viewed by Cordella Register on 06/20/9406 10:04 AM Written by Mar Daring, PA-C on 04/03/2019 10:03 AM So the platelets still clotted. I am referring you to hematology as well for further work up. They may be associated but possible it could be two things going on and I want to be safe. I did check clotting factors as well and they were normal. I did not check for clotting disorders. That is why I am referring to hematology to evaluate for any clotting disorders.

## 2019-04-03 NOTE — Telephone Encounter (Signed)
Patient has been having leg cramps. She wants to know if this could be related to her recent blood work results? She wanted to be seen today but there were no openings other than the 3:40pm (which is possibly on hold for another patient). Patient wants to speak with a nurse about what she needs to do about her leg cramps. Can she be worked in?

## 2019-04-03 NOTE — Telephone Encounter (Signed)
Patient scheduled at 4:00 pm

## 2019-04-03 NOTE — Telephone Encounter (Signed)
-----   Message from Mar Daring, [place] sent at 04/03/2019 10:03 AM EDT ----- So the platelets still clotted. I am referring you to hematology as well for further work up. They may be associated but possible it could be two things going on and I want to be safe. I did check clotting factors as well and they were normal. I did not check for clotting disorders. That is why I am referring to hematology to evaluate for any clotting disorders.

## 2019-04-08 ENCOUNTER — Inpatient Hospital Stay: Payer: Managed Care, Other (non HMO) | Admitting: Oncology

## 2019-04-10 ENCOUNTER — Inpatient Hospital Stay: Payer: Managed Care, Other (non HMO) | Admitting: Oncology

## 2019-04-13 ENCOUNTER — Other Ambulatory Visit: Payer: Self-pay

## 2019-04-13 ENCOUNTER — Encounter: Payer: Self-pay | Admitting: Oncology

## 2019-04-13 ENCOUNTER — Inpatient Hospital Stay: Payer: Managed Care, Other (non HMO) | Attending: Oncology | Admitting: Oncology

## 2019-04-13 ENCOUNTER — Inpatient Hospital Stay: Payer: Managed Care, Other (non HMO) | Admitting: *Deleted

## 2019-04-13 VITALS — BP 141/90 | HR 93 | Temp 97.3°F | Ht 65.0 in | Wt 219.0 lb

## 2019-04-13 DIAGNOSIS — R898 Other abnormal findings in specimens from other organs, systems and tissues: Secondary | ICD-10-CM

## 2019-04-13 DIAGNOSIS — R233 Spontaneous ecchymoses: Secondary | ICD-10-CM

## 2019-04-13 DIAGNOSIS — D691 Qualitative platelet defects: Secondary | ICD-10-CM | POA: Diagnosis not present

## 2019-04-13 DIAGNOSIS — I10 Essential (primary) hypertension: Secondary | ICD-10-CM | POA: Insufficient documentation

## 2019-04-13 DIAGNOSIS — E538 Deficiency of other specified B group vitamins: Secondary | ICD-10-CM | POA: Insufficient documentation

## 2019-04-13 LAB — CBC WITH DIFFERENTIAL/PLATELET
Abs Immature Granulocytes: 0.02 10*3/uL (ref 0.00–0.07)
Basophils Absolute: 0 10*3/uL (ref 0.0–0.1)
Basophils Relative: 0 %
Eosinophils Absolute: 0 10*3/uL (ref 0.0–0.5)
Eosinophils Relative: 1 %
HCT: 38.9 % (ref 36.0–46.0)
Hemoglobin: 12.9 g/dL (ref 12.0–15.0)
Immature Granulocytes: 0 %
Lymphocytes Relative: 22 %
Lymphs Abs: 1.2 10*3/uL (ref 0.7–4.0)
MCH: 31.2 pg (ref 26.0–34.0)
MCHC: 33.2 g/dL (ref 30.0–36.0)
MCV: 94 fL (ref 80.0–100.0)
Monocytes Absolute: 0.3 10*3/uL (ref 0.1–1.0)
Monocytes Relative: 5 %
Neutro Abs: 3.9 10*3/uL (ref 1.7–7.7)
Neutrophils Relative %: 72 %
Platelets: 243 10*3/uL (ref 150–400)
RBC: 4.14 MIL/uL (ref 3.87–5.11)
RDW: 11.9 % (ref 11.5–15.5)
WBC: 5.4 10*3/uL (ref 4.0–10.5)
nRBC: 0 % (ref 0.0–0.2)

## 2019-04-13 LAB — PLATELET FUNCTION ASSAY: Collagen / Epinephrine: 128 seconds (ref 0–193)

## 2019-04-13 LAB — PROTIME-INR
INR: 1 (ref 0.8–1.2)
Prothrombin Time: 13.1 seconds (ref 11.4–15.2)

## 2019-04-13 LAB — APTT: aPTT: 37 seconds — ABNORMAL HIGH (ref 24–36)

## 2019-04-13 NOTE — Progress Notes (Signed)
Patient is nervous at this time due to her diagnosis since she does not know what to expect.

## 2019-04-13 NOTE — Progress Notes (Signed)
Hematology/Oncology Consult note [facility] Telephone:([phone] Fax:([phone]  Patient Care Team: Rubye Beach as PCP - General (Physician Assistant)   Name of the patient: Ashley Ballard  [id number]  1966-05-22    Reason for referral-platelet clumping noted on peripheral smear/petechiae in her lower extremities   Referring physician-Jennifer Tollie Pizza, [place]  Date of visit: 04/13/19   History of presenting illness-patient is a 53 year old female who has been referred to [place] for petechiae noted on her lower extremities.  Also on her CBC patient was found to have platelet clumping.  Patient currently denies any bleeding or bruising.  She has previously undergone tubal ligation as well as endometrial polyp removal without any significant bleeding issues.  She is currently postmenopausal but she reports no heavy menstrual bleeding when she was premenopausal.  Her appetite and weight have remained stable.  ECOG PS- 0  Pain scale- 0   Review of systems- Review of Systems  Constitutional: Negative for chills, fever, malaise/fatigue and weight loss.  HENT: Negative for congestion, ear discharge and nosebleeds.   Eyes: Negative for blurred vision.  Respiratory: Negative for cough, hemoptysis, sputum production, shortness of breath and wheezing.   Cardiovascular: Negative for chest pain, palpitations, orthopnea and claudication.  Gastrointestinal: Negative for abdominal pain, blood in stool, constipation, diarrhea, heartburn, melena, nausea and vomiting.  Genitourinary: Negative for dysuria, flank pain, frequency, hematuria and urgency.  Musculoskeletal: Negative for back pain, joint pain and myalgias.  Skin: Negative for rash.  Neurological: Negative for dizziness, tingling, focal weakness, seizures, weakness and headaches.  Endo/Heme/Allergies: Does not bruise/bleed easily.  Psychiatric/Behavioral: Negative for depression and suicidal ideas. The  patient does not have insomnia.     Allergies  Allergen Reactions  . Lisinopril Itching    Mouth Itching    Patient Active Problem List   Diagnosis Date Noted  . Endometrial polyp 01/08/2018  . Postmenopausal bleeding 12/31/2017  . B12 deficiency 04/12/2017  . Essential hypertension 01/06/2016  . Chronic sinusitis 05/02/2015  . Abnormal ECG 04/07/2015  . 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 Acid reflux 04/07/2015  . Low back pain with sciatica 04/07/2015  . Adiposity 04/07/2015  . Hemorrhage, postmenopausal 04/07/2015  . Pain 04/07/2015  . Avitaminosis D 04/07/2015     Past Medical History:  Diagnosis Date  . Family history of adverse reaction to anesthesia    mother had problems  . GERD (gastroesophageal reflux disease)   . Hypertension      Past Surgical History:  Procedure Laterality Date  . BIOPSY ENDOMETRIAL  01/21/2014  . BREAST CYST EXCISION Right 1983  . DILATATION & CURETTAGE/HYSTEROSCOPY WITH MYOSURE N/A 03/04/2018   Procedure: DILATATION & CURETTAGE/HYSTEROSCOPY;  Surgeon: Will Bonnet, MD;  Location: [facility] ORS;  Service: Gynecology;  Laterality: N/A;  . TUBAL LIGATION      Social History   Socioeconomic History  . Marital status: Married    Spouse name: Legrand Como  . Number of children: 2  . Years of education: Not on file  . Highest education level: Not on file  Occupational History  . Occupation: full time  Social Needs  . Financial resource strain: Not on file  . Food insecurity    Worry: Not on file    Inability: Not on file  . Transportation needs    Medical: Not on file    Non-medical: Not on file  Tobacco Use  . Smoking status: Never Smoker  . Smokeless tobacco: Never Used  Substance and Sexual Activity  .  Alcohol use: No    Alcohol/week: 0.0 standard drinks  . Drug use: No  . Sexual activity: Yes  Lifestyle  . Physical activity    Days per week: Not on file    Minutes per session: Not on file  . Stress: Not on file   Relationships  . Social Musicianconnections    Talks on phone: Not on file    Gets together: Not on file    Attends religious service: Not on file    Active member of club or organization: Not on file    Attends meetings of clubs or organizations: Not on file    Relationship status: Not on file  . Intimate partner violence    Fear of current or ex partner: Not on file    Emotionally abused: Not on file    Physically abused: Not on file    Forced sexual activity: Not on file  Other Topics Concern  . Not on file  Social History Narrative  . Not on file     Family History  Problem Relation Age of Onset  . Hypertension Mother   . Diabetes Mother   . Breast cancer Mother 1758  . Pneumonia Paternal Grandmother   . Leukemia Paternal Grandfather   . Breast cancer Maternal Grandmother [id number]  . Heart attack Paternal Uncle   . Kidney cancer Maternal Grandfather      Current Outpatient Medications:  .  amLODipine (NORVASC) 5 MG tablet, Take 1 tablet (5 mg total) by mouth daily., Disp: 90 tablet, Rfl: 3 .  Ascorbic Acid (VITAMIN C) 1000 MG tablet, Take 2,000 mg by mouth daily., Disp: , Rfl:  .  cetirizine (ZYRTEC) 10 MG tablet, Take 10 mg by mouth daily., Disp: , Rfl:  .  Cholecalciferol (VITAMIN D-3) 5000 units TABS, Take 5,000 Units by mouth daily., Disp: , Rfl:  .  fluticasone (FLONASE) 50 MCG/ACT nasal spray, Place 2 sprays into both nostrils daily. (Patient taking differently: Place 2 sprays into both nostrils daily as needed for allergies. ), Disp: 16 g, Rfl: 11 .  furosemide (LASIX) 20 MG tablet, TAKE 1 TABLET(20 MG) BY MOUTH DAILY AS NEEDED FOR FLUID RETENTION, Disp: 90 tablet, Rfl: 1 .  ibuprofen (ADVIL,MOTRIN) 800 MG tablet, TAKE 1 TABLET BY MOUTH  EVERY 8 HOURS AS NEEDED FOR PAIN ., Disp: 270 tablet, Rfl: 1 .  loratadine (CLARITIN) 10 MG tablet, Take 10 mg by mouth daily., Disp: , Rfl:  .  cyclobenzaprine (FLEXERIL) 5 MG tablet, Take 1 tablet (5 mg total) by mouth at bedtime. (Patient not  taking: Reported on 04/13/2019), Disp: 90 tablet, Rfl: 1 .  triamcinolone cream (KENALOG) 0.1 %, Apply 1 application topically 2 (two) times daily. (Patient not taking: Reported on 04/13/2019), Disp: 30 g, Rfl: 0   Physical exam:  Vitals:   04/13/19 1111  BP: (!) 141/90  Pulse: 93  Temp: (!) 97.3 F (36.3 C)  TempSrc: Tympanic  Weight: 219 lb (99.3 kg)  Height: 5\' 5"  (1.651 m)   Physical Exam HENT:     Head: Normocephalic and atraumatic.  Eyes:     Pupils: Pupils are equal, round, and reactive to light.  Neck:     Musculoskeletal: Normal range of motion.  Cardiovascular:     Rate and Rhythm: Normal rate and regular rhythm.     Heart sounds: Normal heart sounds.  Pulmonary:     Effort: Pulmonary effort is normal.     Breath sounds: Normal breath sounds.  Abdominal:  General: Bowel sounds are normal.     Palpations: Abdomen is soft.  Skin:    General: Skin is warm and dry.     Comments: Better scattered minute pinpoint petechiae seen in her bilateral lower extremities.  No evidence of any big bruises  Neurological:     Mental Status: She is alert and oriented to person, place, and time.        CMP Latest Ref Rng & Units 03/05/2019  Glucose 65 - 99 mg/dL 84  BUN 6 - 24 mg/dL 14  Creatinine 1.610.57 - 0.961.00 mg/dL 0.450.76  Sodium 409134 - 811144 mmol/L 140  Potassium 3.5 - 5.2 mmol/L 4.6  Chloride 96 - 106 mmol/L 105  CO2 20 - 29 mmol/L 21  Calcium 8.7 - 10.2 mg/dL 9.4  Total Protein 6.0 - 8.5 g/dL -  Total Bilirubin 0.0 - 1.2 mg/dL -  Alkaline Phos 39 - 914117 IU/L -  AST 0 - 40 IU/L -  ALT 0 - 32 IU/L -   CBC Latest Ref Rng & Units 04/13/2019  WBC 4.0 - 10.5 K/uL 5.4  Hemoglobin 12.0 - 15.0 g/dL 78.212.9  Hematocrit 95.636.0 - 46.0 % 38.9  Platelets 150 - 400 K/uL 243     Assessment and plan- Patient is a 53 y.o. female referred for petechiae and platelet clumping noted on CBC  We will reobtain the CBC on a citrated sample of anticoagulant to get an accurate platelet count.  I  will also get a platelet function assay, PT PTT INR and von Willebrand panel.  I will see the patient back in 2 weeks time discuss the results of blood work.  Of note patient will also be seeing rheumatology for positive ANA noted on her blood work which I do not think is contributing to her petechiae.  These petechiae are minute and scattered and do not appear to be clinically significant at this time.  I do not suspect that the patient has an inherited or acquired bleeding disorder.  She did undergo PT PTT INR on 04/01/2019 which was within normal limits.  Her estimated platelet count on 03/26/2019 was 117 but the actual count may be somewhat higher due to aggregation of platelets   Thank you for this kind referral and the opportunity to participate in the care of this patient   Visit Diagnosis 1. Petechiae   2. Pseudothrombocytopenia     Dr. Owens SharkArchana Rao, MD, MPH [facility] [phone] 04/13/2019  12:12 PM

## 2019-04-14 LAB — VON WILLEBRAND PANEL
Coagulation Factor VIII: 121 % (ref 56–140)
Ristocetin Co-factor, Plasma: 55 % (ref 50–200)
Von Willebrand Antigen, Plasma: 83 % (ref 50–200)

## 2019-04-14 LAB — COAG STUDIES INTERP REPORT

## 2019-04-16 ENCOUNTER — Encounter: Payer: Managed Care, Other (non HMO) | Admitting: Oncology

## 2019-04-17 ENCOUNTER — Encounter: Payer: Self-pay | Admitting: Oncology

## 2019-04-18 ENCOUNTER — Encounter: Payer: Self-pay | Admitting: Oncology

## 2019-04-23 ENCOUNTER — Other Ambulatory Visit: Payer: Self-pay

## 2019-04-27 ENCOUNTER — Inpatient Hospital Stay: Payer: Managed Care, Other (non HMO) | Attending: Oncology | Admitting: Oncology

## 2019-04-27 ENCOUNTER — Other Ambulatory Visit: Payer: Self-pay

## 2019-04-27 VITALS — BP 153/95 | HR 103 | Temp 97.2°F | Resp 18 | Wt 221.3 lb

## 2019-04-27 DIAGNOSIS — D691 Qualitative platelet defects: Secondary | ICD-10-CM

## 2019-04-27 NOTE — Progress Notes (Signed)
Patient does not offer any problems today.   BP is elevated today (171/94, 156/96, 153/95)

## 2019-04-27 NOTE — Progress Notes (Signed)
Hematology/Oncology Consult note [facility]  Telephone:([phone] Fax:(336) [phone]  Patient Care Team: Reine JustBurnette, Jennifer M, PA-C as PCP - General (Physician Assistant)   Name of the patient: Ashley DaviesFelicia Ballard  [id number]  20-Oct-1966   Date of visit: 04/27/19  Diagnosis-abnormal platelet clumping  Chief complaint/ Reason for visit-discuss results of blood work  Heme/Onc history: patient is a 53 year old female who has been referred to us for petechiae noted on her lower extremities.  Also on her CBC patient was found to have platelet clumping.  Patient currently denies any bleeding or bruising.  She has previously undergone tubal ligation as well as endometrial polyp removal without any significant bleeding issues.  She is currently postmenopausal but she reports no heavy menstrual bleeding when she was premenopausal.  Her appetite and weight have remained stable  Results of blood work from 04/13/2019 were as follows: CBC was done on a heparinized sample and white count was normal at 5.4, H&H of 12.9/38.9 and a platelet count of 243.  PT/INR was normal.  PTT was mildly elevated at 37.  Von Willebrand panel was normal.  Platelet function assay was also normal.  Interval history-patient currently feels well and denies any new complaints.  Denies any new areas of bleeding or bruising.  ECOG PS- 0 Pain scale- 0   Review of systems- Review of Systems  Constitutional: Negative for chills, fever, malaise/fatigue and weight loss.  HENT: Negative for congestion, ear discharge and nosebleeds.   Eyes: Negative for blurred vision.  Respiratory: Negative for cough, hemoptysis, sputum production, shortness of breath and wheezing.   Cardiovascular: Negative for chest pain, palpitations, orthopnea and claudication.  Gastrointestinal: Negative for abdominal pain, blood in stool, constipation, diarrhea, heartburn, melena, nausea and vomiting.  Genitourinary: Negative for  dysuria, flank pain, frequency, hematuria and urgency.  Musculoskeletal: Positive for back pain. Negative for joint pain and myalgias.  Skin: Negative for rash.  Neurological: Negative for dizziness, tingling, focal weakness, seizures, weakness and headaches.  Endo/Heme/Allergies: Does not bruise/bleed easily.  Psychiatric/Behavioral: Negative for depression and suicidal ideas. The patient does not have insomnia.       Allergies  Allergen Reactions  . Lisinopril Itching    Mouth Itching     Past Medical History:  Diagnosis Date  . Family history of adverse reaction to anesthesia    mother had problems  . GERD (gastroesophageal reflux disease)   . Hypertension      Past Surgical History:  Procedure Laterality Date  . BIOPSY ENDOMETRIAL  01/21/2014  . BREAST CYST EXCISION Right 1983  . DILATATION & CURETTAGE/HYSTEROSCOPY WITH MYOSURE N/A 03/04/2018   Procedure: DILATATION & CURETTAGE/HYSTEROSCOPY;  Surgeon: Conard NovakJackson, Stephen D, MD;  Location: [facility] ORS;  Service: Gynecology;  Laterality: N/A;  . TUBAL LIGATION      Social History   Socioeconomic History  . Marital status: Married    Spouse name: Casimiro NeedleMichael  . Number of children: 2  . Years of education: Not on file  . Highest education level: Not on file  Occupational History  . Occupation: full time  Social Needs  . Financial resource strain: Not on file  . Food insecurity    Worry: Not on file    Inability: Not on file  . Transportation needs    Medical: Not on file    Non-medical: Not on file  Tobacco Use  . Smoking status: Never Smoker  . Smokeless tobacco: Never Used  Substance and Sexual Activity  . Alcohol use:  No    Alcohol/week: 0.0 standard drinks  . Drug use: No  . Sexual activity: Yes  Lifestyle  . Physical activity    Days per week: Not on file    Minutes per session: Not on file  . Stress: Not on file  Relationships  . Social Musicianconnections    Talks on phone: Not on file    Gets together: Not on  file    Attends religious service: Not on file    Active member of club or organization: Not on file    Attends meetings of clubs or organizations: Not on file    Relationship status: Not on file  . Intimate partner violence    Fear of current or ex partner: Not on file    Emotionally abused: Not on file    Physically abused: Not on file    Forced sexual activity: Not on file  Other Topics Concern  . Not on file  Social History Narrative  . Not on file    Family History  Problem Relation Age of Onset  . Hypertension Mother   . Diabetes Mother   . Breast cancer Mother 1158  . Pneumonia Paternal Grandmother   . Leukemia Paternal Grandfather   . Breast cancer Maternal Grandmother [phone]  . Heart attack Paternal Uncle   . Kidney cancer Maternal Grandfather      Current Outpatient Medications:  .  amLODipine (NORVASC) 5 MG tablet, Take 1 tablet (5 mg total) by mouth daily., Disp: 90 tablet, Rfl: 3 .  Ascorbic Acid (VITAMIN C) 1000 MG tablet, Take 2,000 mg by mouth daily., Disp: , Rfl:  .  cetirizine (ZYRTEC) 10 MG tablet, Take 10 mg by mouth daily., Disp: , Rfl:  .  Cholecalciferol (VITAMIN D-3) 5000 units TABS, Take 5,000 Units by mouth daily., Disp: , Rfl:  .  fluticasone (FLONASE) 50 MCG/ACT nasal spray, Place 2 sprays into both nostrils daily. (Patient taking differently: Place 2 sprays into both nostrils daily as needed for allergies. ), Disp: 16 g, Rfl: 11 .  furosemide (LASIX) 20 MG tablet, TAKE 1 TABLET(20 MG) BY MOUTH DAILY AS NEEDED FOR FLUID RETENTION, Disp: 90 tablet, Rfl: 1 .  ibuprofen (ADVIL,MOTRIN) 800 MG tablet, TAKE 1 TABLET BY MOUTH  EVERY 8 HOURS AS NEEDED FOR PAIN ., Disp: 270 tablet, Rfl: 1 .  loratadine (CLARITIN) 10 MG tablet, Take 10 mg by mouth daily., Disp: , Rfl:  .  cyclobenzaprine (FLEXERIL) 5 MG tablet, Take 1 tablet (5 mg total) by mouth at bedtime. (Patient not taking: Reported on 04/13/2019), Disp: 90 tablet, Rfl: 1 .  triamcinolone cream (KENALOG) 0.1 %,  Apply 1 application topically 2 (two) times daily. (Patient not taking: Reported on 04/13/2019), Disp: 30 g, Rfl: 0  Physical exam:  Vitals:   04/27/19 1313  BP: (!) 153/95  Pulse: (!) 103  Resp: 18  Temp: (!) 97.2 F (36.2 C)  Weight: 221 lb 4.8 oz (100.4 kg)   Physical Exam Constitutional:      General: She is not in acute distress. HENT:     Head: Normocephalic and atraumatic.  Eyes:     Pupils: Pupils are equal, round, and reactive to light.  Neck:     Musculoskeletal: Normal range of motion.  Cardiovascular:     Rate and Rhythm: Normal rate and regular rhythm.     Heart sounds: Normal heart sounds.  Pulmonary:     Effort: Pulmonary effort is normal.     Breath sounds:  Normal breath sounds.  Abdominal:     General: Bowel sounds are normal.     Palpations: Abdomen is soft.  Skin:    General: Skin is warm and dry.     Comments: No new areas of bruising noted.  Neurological:     Mental Status: She is alert and oriented to person, place, and time.      CMP Latest Ref Rng & Units 03/05/2019  Glucose 65 - 99 mg/dL 84  BUN 6 - 24 mg/dL 14  Creatinine 0.57 - 1.00 mg/dL 0.76  Sodium 134 - 144 mmol/L 140  Potassium 3.5 - 5.2 mmol/L 4.6  Chloride 96 - 106 mmol/L 105  CO2 20 - 29 mmol/L 21  Calcium 8.7 - 10.2 mg/dL 9.4  Total Protein 6.0 - 8.5 g/dL -  Total Bilirubin 0.0 - 1.2 mg/dL -  Alkaline Phos 39 - 117 IU/L -  AST 0 - 40 IU/L -  ALT 0 - 32 IU/L -   CBC Latest Ref Rng & Units 04/13/2019  WBC 4.0 - 10.5 K/uL 5.4  Hemoglobin 12.0 - 15.0 g/dL 12.9  Hematocrit 36.0 - 46.0 % 38.9  Platelets 150 - 400 K/uL 243     Assessment and plan- Patient is a 53 y.o. female sent for abnormal platelet clumping noted causing pseudothrombocytopenia.  When her CBC was repeated on a heparin I sample her platelet counts are normal.  Also her PT/INR is normal.  PTT which was elevated at 1 point more than normal at 937 does not indicate any significant bleeding disorder.  Von  Willebrand panel as well as platelet function assay was also normal.  Patient does not require any hematology follow-up at this time.  She can be referred to [place] in the future if she has any worsening bleeding or bruising.  She can go back to taking occasional ibuprofen for back pain.  Again I have advised her against using NSAIDs on a regular basis.   Visit Diagnosis 1. Abnormal platelet function test [facility])      Dr. Randa Evens, MD, MPH [facility] at [facility] [phone] 04/27/2019 2:19 PM

## 2019-04-29 NOTE — Progress Notes (Signed)
Office Visit Note  Patient: Ashley Ballard             Date of Birth: 23-May-1966           MRN: [id number]             PCP: Mar Daring, PA-C Referring: Florian Buff* Visit Date: 05/08/2019 Occupation: Collection specialist for [employer]  Subjective:  Positive ANA.   History of Present Illness: Ashley Ballard is a 53 y.o. female seen in consultation per request of her PCP.  According to patient about a year ago she developed a rash on her lower extremities which was diagnosed as petechiae by her PCP.  At that time she was switched from HCTZ to Lasix.  The petechiae persist and she was referred to hematologist.  According to patient the hematological evaluation was normal.  Her PCP did some labs which came positive for ANA for that reason she was referred to me.  Activities of Daily Living:  Patient reports morning stiffness for 0 none.   Patient Denies nocturnal pain.  Difficulty dressing/grooming: Denies Difficulty climbing stairs: Denies Difficulty getting out of chair: Denies Difficulty using hands for taps, buttons, cutlery, and/or writing: Denies  Review of Systems  Constitutional: Negative for fatigue, night sweats, weight gain and weight loss.  HENT: Negative for mouth sores, trouble swallowing, trouble swallowing, mouth dryness and nose dryness.   Eyes: Negative for pain, redness, visual disturbance and dryness.  Respiratory: Negative for cough, shortness of breath and difficulty breathing.   Cardiovascular: Negative for chest pain, palpitations, hypertension, irregular heartbeat and swelling in legs/feet.  Gastrointestinal: Negative for blood in stool, constipation and diarrhea.  Endocrine: Negative for cold intolerance and increased urination.  Genitourinary: Negative for difficulty urinating and vaginal dryness.  Musculoskeletal: Positive for arthralgias and joint pain. Negative for joint swelling, myalgias, muscle weakness, morning stiffness, muscle  tenderness and myalgias.       Lower back pain  Skin: Negative for color change, rash, hair loss, skin tightness, ulcers and sensitivity to sunlight.  Allergic/Immunologic: Negative for susceptible to infections.  Neurological: Negative for dizziness, memory loss, night sweats and weakness.  Hematological: Negative for bruising/bleeding tendency and swollen glands.  Psychiatric/Behavioral: Negative for depressed mood and sleep disturbance. The patient is not nervous/anxious.     PMFS History:  Patient Active Problem List   Diagnosis Date Noted   Endometrial polyp 01/08/2018   Postmenopausal bleeding 12/31/2017   B12 deficiency 04/12/2017   Essential hypertension 01/06/2016   Chronic sinusitis 05/02/2015   Abnormal ECG 04/07/2015   Bloodgood disease 04/07/2015   Acid reflux 04/07/2015   Low back pain with sciatica 04/07/2015   Adiposity 04/07/2015   Hemorrhage, postmenopausal 04/07/2015   Pain 04/07/2015   Avitaminosis D 04/07/2015    Past Medical History:  Diagnosis Date   Family history of adverse reaction to anesthesia    mother had problems   GERD (gastroesophageal reflux disease)    Hypertension     Family History  Problem Relation Age of Onset   Hypertension Mother    Diabetes Mother    Breast cancer Mother 44   Pneumonia Paternal Grandmother    Leukemia Paternal Grandfather    Breast cancer Maternal Grandmother 85   Heart attack Paternal Uncle    Kidney cancer Maternal Grandfather    Past Surgical History:  Procedure Laterality Date   BIOPSY ENDOMETRIAL  01/21/2014   BREAST CYST EXCISION Right 1983   DILATATION & CURETTAGE/HYSTEROSCOPY WITH MYOSURE  N/A 03/04/2018   Procedure: DILATATION & CURETTAGE/HYSTEROSCOPY;  Surgeon: Conard NovakJackson, Stephen D, MD;  Location: [facility];  Service: Gynecology;  Laterality: N/A;   TUBAL LIGATION     Social History   Social History Narrative   Not on file   Immunization History  Administered Date(s)  Administered   Td 12/26/2018   Tdap 10/27/2008     Objective: Vital Signs: BP 138/85 (BP Location: Right Arm, Patient Position: Sitting, Cuff Size: Normal)    Pulse 88    Resp 16    Ht 5\' 4"  (1.626 m)    Wt 224 lb 4.8 oz (101.7 kg)    LMP 11/19/2015    BMI 38.50 kg/m    Physical Exam Vitals signs and nursing note reviewed.  Constitutional:      Appearance: She is well-developed.  HENT:     Head: Normocephalic and atraumatic.  Eyes:     Conjunctiva/sclera: Conjunctivae normal.  Neck:     Musculoskeletal: Normal range of motion.  Cardiovascular:     Rate and Rhythm: Normal rate and regular rhythm.     Heart sounds: Normal heart sounds.  Pulmonary:     Effort: Pulmonary effort is normal.     Breath sounds: Normal breath sounds.  Abdominal:     General: Bowel sounds are normal.     Palpations: Abdomen is soft.  Lymphadenopathy:     Cervical: No cervical adenopathy.  Skin:    General: Skin is warm and dry.     Capillary Refill: Capillary refill takes less than 2 seconds.     Comments: Fine petechiae were noted on bilateral lower extremities consistent with Schamberg's purpura.  Neurological:     Mental Status: She is alert and oriented to person, place, and time.  Psychiatric:        Behavior: Behavior normal.      Musculoskeletal Exam: C-spine thoracic and lumbar spine with good range of motion.  Shoulder joints, elbow joints, wrist joints, MCPs, PIPs and DIPs with good range of motion with no synovitis.  Hip joints, knee joints, ankles, MTPs and PIPs with good range of motion with no synovitis.  CDAI Exam: CDAI Score: -- Patient Global: --; Provider Global: -- Swollen: --; Tender: -- Joint Exam   No joint exam has been documented for this visit   There is currently no information documented on the homunculus. Go to the Rheumatology activity and complete the homunculus joint exam.  Investigation: Findings:  03/26/19: ANA 1:320 H, 1:1280 speckled, RF<10, CCP 6, C3  117, C4 33, CRP 2, sed rate 14, ANCA-, Cryoglobulin not detected   Component     Latest Ref Rng & Units 03/26/2019  ANA Titer 1      Positive (A)  RA Latex Turbid.     0.0 - 13.9 IU/mL <10.0  Cyclic Citrullin Peptide Ab     0 - 19 units 6  Cytoplasmic (C-ANCA)     Neg:<1:20 titer <1:20  P-ANCA     Neg:<1:20 titer <1:20  Atypical pANCA     Neg:<1:20 titer <1:20  Homogeneous Pattern      1:320 (H)  Speckled Pattern      1:1280 (H)  NOTE:      Comment  Complement C3, Serum     82 - 167 mg/dL 161117  Complement C4, Serum     14 - 44 mg/dL 33  CRP     0 - 10 mg/L 2  Sed Rate     0 - 40  mm/hr 14  Cryoglobulin, Ql, Serum, Rflx     None detected Comment   Imaging: No results found.  Recent Labs: Lab Results  Component Value Date   WBC 5.4 04/13/2019   HGB 12.9 04/13/2019   PLT 243 04/13/2019   NA 140 03/05/2019   K 4.6 03/05/2019   CL 105 03/05/2019   CO2 21 03/05/2019   GLUCOSE 84 03/05/2019   BUN 14 03/05/2019   CREATININE 0.76 03/05/2019   BILITOT 0.8 07/14/2018   ALKPHOS 97 07/14/2018   AST 23 07/14/2018   ALT 21 07/14/2018   PROT 7.0 07/14/2018   ALBUMIN 4.6 07/14/2018   CALCIUM 9.4 03/05/2019   GFRAA 102 03/05/2019    Speciality Comments: No specialty comments available.  Procedures:  No procedures performed Allergies: Lisinopril   Assessment / Plan:     Visit Diagnoses: Positive ANA (antinuclear antibody) - 03/26/19: ANA 1:320 H, 1:1280 speckled, RF<10, CCP 6, C3 117, C4 33, CRP 2, sed rate 14, ANCA-, Cryoglobulin not detected  -patient is significantly high titer of ANA.  I do not see any clinical features of autoimmune disease on examination.  She is quite concerned about the positive ANA titer.  I will obtain following labs to evaluate this further.  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  Chronic low back pain with sciatica, sciatica laterality unspecified, unspecified back pain laterality -she has chronic discomfort which is manageable currently.  Essential hypertension -she is taking medications and blood pressure is controlled.  Avitaminosis D -patient states her vitamin D levels are normal now.  Orders: Orders Placed This Encounter  Procedures   ANA   Anti-scleroderma antibody   Anti-Smith antibody   Lupus anticoagulant panel   Anti-DNA antibody, double-stranded   Cardiolipin antibodies, IgG, IgM, IgA   Urinalysis, Routine w reflex microscopic   Sjogrens syndrome-A extractable nuclear antibody   RNP Antibodies   Beta-2-glycoprotein i abs, IgG/M/A   Sjogrens syndrome-B extractable nuclear antibody   No orders of the defined types were placed in this encounter.     Follow-Up Instructions: Return for Positive ANA.   Pollyann SavoyShaili Topher Buenaventura, MD  Note - This record has been created using Animal [employer]agon software.  Chart creation errors have been sought, but may not always  have been located. Such creation errors do not reflect on  the standard of medical care.

## 2019-05-08 ENCOUNTER — Ambulatory Visit: Payer: Managed Care, Other (non HMO) | Admitting: Rheumatology

## 2019-05-08 ENCOUNTER — Encounter: Payer: Self-pay | Admitting: Rheumatology

## 2019-05-08 ENCOUNTER — Other Ambulatory Visit: Payer: Self-pay

## 2019-05-08 VITALS — BP 138/85 | HR 88 | Resp 16 | Ht 64.0 in | Wt 224.3 lb

## 2019-05-08 DIAGNOSIS — E559 Vitamin D deficiency, unspecified: Secondary | ICD-10-CM

## 2019-05-08 DIAGNOSIS — I10 Essential (primary) hypertension: Secondary | ICD-10-CM

## 2019-05-08 DIAGNOSIS — R768 Other specified abnormal immunological findings in serum: Secondary | ICD-10-CM

## 2019-05-08 DIAGNOSIS — M5440 Lumbago with sciatica, unspecified side: Secondary | ICD-10-CM | POA: Diagnosis not present

## 2019-05-08 DIAGNOSIS — L817 Pigmented purpuric dermatosis: Secondary | ICD-10-CM | POA: Diagnosis not present

## 2019-05-08 DIAGNOSIS — G8929 Other chronic pain: Secondary | ICD-10-CM

## 2019-05-13 ENCOUNTER — Encounter: Payer: Self-pay | Admitting: Rheumatology

## 2019-05-13 LAB — URINALYSIS, ROUTINE W REFLEX MICROSCOPIC
Bilirubin, UA: NEGATIVE
Glucose, UA: NEGATIVE
Ketones, UA: NEGATIVE
Leukocytes,UA: NEGATIVE
Nitrite, UA: NEGATIVE
Protein,UA: NEGATIVE
RBC, UA: NEGATIVE
Specific Gravity, UA: 1.01 (ref 1.005–1.030)
Urobilinogen, Ur: 0.2 mg/dL (ref 0.2–1.0)
pH, UA: 6.5 (ref 5.0–7.5)

## 2019-05-13 LAB — RNP ANTIBODIES: ENA RNP Ab: <0.2 AI (ref 0.0–0.9)

## 2019-05-13 LAB — CARDIOLIPIN ANTIBODIES, IGG, IGM, IGA
Anticardiolipin IgA: <9 APL U/mL (ref 0–11)
Anticardiolipin IgG: 22 GPL U/mL — ABNORMAL HIGH (ref 0–14)
Anticardiolipin IgM: 26 MPL U/mL — ABNORMAL HIGH (ref 0–12)

## 2019-05-13 LAB — LUPUS ANTICOAGULANT PANEL
Dilute Viper Venom Time: 26.4 s (ref 0.0–47.0)
PTT Lupus Anticoagulant: 37.9 s (ref 0.0–51.9)

## 2019-05-13 LAB — SJOGRENS SYNDROME-A EXTRACTABLE NUCLEAR ANTIBODY: ENA SSA (RO) Ab: <0.2 AI (ref 0.0–0.9)

## 2019-05-13 LAB — BETA-2-GLYCOPROTEIN I ABS, IGG/M/A
Beta-2 Glyco 1 IgA: <9 GPI IgA units (ref 0–25)
Beta-2 Glyco 1 IgM: <9 GPI IgM units (ref 0–32)
Beta-2 Glyco I IgG: <9 GPI IgG units (ref 0–20)

## 2019-05-13 LAB — ANA: Anti Nuclear Antibody (ANA): NEGATIVE

## 2019-05-13 LAB — ANTI-SCLERODERMA ANTIBODY: Scleroderma (Scl-70) (ENA) Antibody, IgG: <0.2 AI (ref 0.0–0.9)

## 2019-05-13 LAB — SJOGRENS SYNDROME-B EXTRACTABLE NUCLEAR ANTIBODY: ENA SSB (LA) Ab: <0.2 AI (ref 0.0–0.9)

## 2019-05-13 LAB — ANTI-SMITH ANTIBODY: ENA SM Ab Ser-aCnc: <0.2 AI (ref 0.0–0.9)

## 2019-05-13 LAB — ANTI-DNA ANTIBODY, DOUBLE-STRANDED: dsDNA Ab: 1 IU/mL (ref 0–9)

## 2019-05-19 ENCOUNTER — Encounter: Payer: Self-pay | Admitting: Physician Assistant

## 2019-05-21 NOTE — Progress Notes (Signed)
Patient: Ashley NavyFelicia B Jonas Female    DOB: 1966/02/09   53 y.o.   MRN: [id number] Visit Date: 05/22/2019  Today's Provider: Margaretann LovelessJennifer M Maecy Podgurski, PA-C   Chief Complaint  Patient presents with  . Shoulder Pain   Subjective:     HPI  Patient here today with c/o right arm pain radiating to her upper back close to her neck. This started on Saturday. Does have a history of this and feels it is coming from working from home. Her desk at home is not set up ergonomically as well as the one at her office. She uses her right arm to move her mouse and feels this is why this arm gives her the most trouble.   Allergies  Allergen Reactions  . Lisinopril Itching    Mouth Itching     Current Outpatient Medications:  .  amLODipine (NORVASC) 5 MG tablet, Take 1 tablet (5 mg total) by mouth daily., Disp: 90 tablet, Rfl: 3 .  Ascorbic Acid (VITAMIN C) 1000 MG tablet, Take 2,000 mg by mouth daily., Disp: , Rfl:  .  cetirizine (ZYRTEC) 10 MG tablet, Take 10 mg by mouth daily., Disp: , Rfl:  .  Cholecalciferol (VITAMIN D-3) 5000 units TABS, Take 5,000 Units by mouth daily., Disp: , Rfl:  .  cyclobenzaprine (FLEXERIL) 5 MG tablet, Take 1 tablet (5 mg total) by mouth at bedtime., Disp: 90 tablet, Rfl: 1 .  fluticasone (FLONASE) 50 MCG/ACT nasal spray, Place 2 sprays into both nostrils daily. (Patient taking differently: Place 2 sprays into both nostrils daily as needed for allergies. ), Disp: 16 g, Rfl: 11 .  furosemide (LASIX) 20 MG tablet, TAKE 1 TABLET(20 MG) BY MOUTH DAILY AS NEEDED FOR FLUID RETENTION, Disp: 90 tablet, Rfl: 1 .  ibuprofen (ADVIL,MOTRIN) 800 MG tablet, TAKE 1 TABLET BY MOUTH  EVERY 8 HOURS AS NEEDED FOR PAIN ., Disp: 270 tablet, Rfl: 1 .  loratadine (CLARITIN) 10 MG tablet, Take 10 mg by mouth daily., Disp: , Rfl:  .  triamcinolone cream (KENALOG) 0.1 %, Apply 1 application topically 2 (two) times daily. (Patient not taking: Reported on 04/13/2019), Disp: 30 g, Rfl: 0  Review of  Systems  Constitutional: Negative.   Respiratory: Negative.   Cardiovascular: Negative.   Musculoskeletal: Positive for arthralgias and neck stiffness. Negative for neck pain.  Neurological: Negative for weakness and numbness (intermittent tingling in the right arm, most often associated with sleep and sleep positioning at this time.).    Social History   Tobacco Use  . Smoking status: Never Smoker  . Smokeless tobacco: Never Used  Substance Use Topics  . Alcohol use: No    Alcohol/week: 0.0 standard drinks      Objective:   BP (!) 152/101 (BP Location: Left Wrist, Patient Position: Sitting, Cuff Size: Normal)   Pulse 85   Temp 98.2 F (36.8 C) (Oral)   Resp 16   Wt 218 lb 3.2 oz (99 kg)   LMP 11/19/2015   BMI 37.45 kg/m  Vitals:   05/22/19 1342  BP: (!) 152/101  Pulse: 85  Resp: 16  Temp: 98.2 F (36.8 C)  TempSrc: Oral  Weight: 218 lb 3.2 oz (99 kg)     Physical Exam Vitals signs reviewed.  Constitutional:      General: She is not in acute distress.    Appearance: Normal appearance. She is well-developed. She is obese. She is not ill-appearing or diaphoretic.  Neck:  Musculoskeletal: Normal range of motion and neck supple.     Thyroid: No thyromegaly.     Vascular: No JVD.     Trachea: No tracheal deviation.  Cardiovascular:     Rate and Rhythm: Normal rate and regular rhythm.     Pulses: Normal pulses.     Heart sounds: Normal heart sounds. No murmur. No friction rub. No gallop.   Pulmonary:     Effort: Pulmonary effort is normal. No respiratory distress.     Breath sounds: Normal breath sounds. No wheezing or rales.  Musculoskeletal:     Right shoulder: She exhibits tenderness and spasm (trigger points noted). She exhibits normal range of motion, no pain, normal pulse and normal strength.     Cervical back: Normal.       Arms:  Lymphadenopathy:     Cervical: No cervical adenopathy.  Skin:    Capillary Refill: Capillary refill takes less than 2  seconds.  Neurological:     Mental Status: She is alert.     Motor: No weakness.     Coordination: Coordination normal.      No results found for any visits on 05/22/19.     Assessment & Plan    1. Chronic low back pain with sciatica, sciatica laterality unspecified, unspecified back pain laterality Stable. Diagnosis pulled for medication refill. Continue current medical treatment plan. - cyclobenzaprine (FLEXERIL) 5 MG tablet; Take 1 tablet (5 mg total) by mouth at bedtime.  Dispense: 90 tablet; Refill: 1  2. Trigger point of right shoulder region Will try medrol dose pak as below for inflammation. Flexeril will help spasm. Advised of back buddy device to help with self massage. Biofreeze massage will help also. Call if worsening. - methylPREDNISolone (MEDROL) 4 MG TBPK tablet; 6 day taper; take as directed on package instructions  Dispense: 21 tablet; Refill: 0     Mar Daring, PA-C  [facility]

## 2019-05-22 ENCOUNTER — Encounter: Payer: Self-pay | Admitting: Physician Assistant

## 2019-05-22 ENCOUNTER — Other Ambulatory Visit: Payer: Self-pay

## 2019-05-22 ENCOUNTER — Ambulatory Visit: Payer: Managed Care, Other (non HMO) | Admitting: Physician Assistant

## 2019-05-22 VITALS — BP 152/101 | HR 85 | Temp 98.2°F | Resp 16 | Wt 218.2 lb

## 2019-05-22 DIAGNOSIS — M25511 Pain in right shoulder: Secondary | ICD-10-CM

## 2019-05-22 DIAGNOSIS — M5440 Lumbago with sciatica, unspecified side: Secondary | ICD-10-CM

## 2019-05-22 DIAGNOSIS — G8929 Other chronic pain: Secondary | ICD-10-CM | POA: Diagnosis not present

## 2019-05-22 MED ORDER — METHYLPREDNISOLONE 4 MG PO TBPK: ORAL_TABLET | ORAL | 0 refills | Status: DC

## 2019-05-22 MED ORDER — CYCLOBENZAPRINE HCL 5 MG PO TABS: 5.0000 mg | ORAL_TABLET | Freq: Every day | ORAL | 1 refills | Status: DC

## 2019-05-22 NOTE — Patient Instructions (Signed)
Trigger Point Injection Trigger points are areas where you have pain. A trigger point injection is a shot given in the trigger point to help relieve pain for a few days to a few months. Common places for trigger points include:  The neck.  The shoulders.  The upper back.  The lower back. A trigger point injection will not cure long-term (chronic) pain permanently. These injections do not always work for every person. For some people, they can help to relieve pain for a few days to a few months. Tell a health care provider about:  Any allergies you have.  All medicines you are taking, including vitamins, herbs, eye drops, creams, and over-the-counter medicines.  Any problems you or family members have had with anesthetic medicines.  Any blood disorders you have.  Any surgeries you have had.  Any medical conditions you have. What are the risks? Generally, this is a safe procedure. However, problems may occur, including:  Infection.  Bleeding or bruising.  Allergic reaction to the injected medicine.  Irritation of the skin around the injection site. What happens before the procedure? Ask your health care provider about:  Changing or stopping your regular medicines. This is especially important if you are taking diabetes medicines or blood thinners.  Taking medicines such as aspirin and ibuprofen. These medicines can thin your blood. Do not take these medicines unless your health care provider tells you to take them.  Taking over-the-counter medicines, vitamins, herbs, and supplements. What happens during the procedure?   Your health care provider will feel for trigger points. A marker may be used to circle the area for the injection.  The skin over the trigger point will be washed with a germ-killing (antiseptic) solution.  A thin needle is used for the injection. You may feel pain or a twitching feeling when the needle enters the trigger point.  A numbing solution may  be injected into the trigger point. Sometimes a medicine to keep down inflammation is also injected.  Your health care provider may move the needle around the area where the trigger point is located until the tightness and twitching goes away.  After the injection, your health care provider may put gentle pressure over the injection site.  The injection site will be covered with a bandage (dressing). The procedure may vary among health care providers and hospitals. What can I expect after treatment? After treatment, you may have:  Soreness and stiffness for 1-2 days.  A dressing. This can be taken off in a few hours or as told by your health care provider. Follow these instructions at home: Injection site care  Remove your dressing as told by your health care provider.  Check your injection site every day for signs of infection. Check for: ? Redness, swelling, or pain. ? Fluid or blood. ? Warmth. ? Pus or a bad smell. Managing pain, stiffness, and swelling  If directed, put ice on the affected area. ? Put ice in a plastic bag. ? Place a towel between your skin and the bag. ? Leave the ice on for 20 minutes, 2-3 times a day. General instructions  If you were asked to stop your regular medicines, ask your health care provider when you may start taking them again.  Return to your normal activities as told by your health care provider. Ask your health care provider what activities are safe for you.  Do not take baths, swim, or use a hot tub until your health care provider approves.    You may be asked to see an occupational or physical therapist for exercises that reduce muscle strain and stretch the area of the trigger point.  Keep all follow-up visits as told by your health care provider. This is important. Contact a health care provider if:  Your pain comes back, and it is worse than before the injection. You may need more injections.  You have chills or a fever.  The  injection site becomes more painful, red, swollen, or warm to the touch. Summary  A trigger point injection is a shot given in the trigger point to help relieve pain for a few days to a few months.  Common places for trigger point injections are the neck, shoulder, upper back, and lower back.  These injections do not always work for every person, but for some people, the injections can help to relieve pain for a few days to a few months.  Contact a health care provider if symptoms come back or they are worse than before treatment. Also, get help if the injection site becomes more painful, red, swollen, or warm to the touch. This information is not intended to replace advice given to you by your health care provider. Make sure you discuss any questions you have with your health care provider. Document Released: 09/27/2011 Document Revised: 11/19/2018 Document Reviewed: 11/19/2018 Elsevier Patient Education  2020 [employer]  

## 2019-05-25 ENCOUNTER — Other Ambulatory Visit: Payer: Self-pay | Admitting: Physician Assistant

## 2019-05-25 DIAGNOSIS — I10 Essential (primary) hypertension: Secondary | ICD-10-CM

## 2019-05-27 ENCOUNTER — Encounter: Payer: Self-pay | Admitting: Physician Assistant

## 2019-05-27 DIAGNOSIS — M791 Myalgia, unspecified site: Secondary | ICD-10-CM

## 2019-05-27 MED ORDER — PREDNISONE 20 MG PO TABS: 20.0000 mg | ORAL_TABLET | Freq: Every day | ORAL | 0 refills | Status: DC

## 2019-05-27 NOTE — Addendum Note (Signed)
Addended by: Mar Daring on: 05/27/2019 11:33 AM   Modules accepted: Orders

## 2019-06-02 NOTE — Progress Notes (Signed)
Office Visit Note  Patient: Ashley NavyFelicia B Waas             Date of Birth: 12-30-1965           MRN: [id number]             PCP: Margaretann LovelessBurnette, Jennifer M, PA-C Referring: Suella GroveBurnette, Jennifer M, P* Visit Date: 06/03/2019 Occupation: @GUAROCC @  Subjective:  Discuss lab work   History of Present Illness: Ashley Ballard is a 53 y.o. female with history of positive ANA (repeat ANA negative) and low back pain.  She continues to have chronic lower back pain and follows up with her PCP for management. She has intermittent sciatica bilaterally. She is currently on prednisone 20 mg po daily.  Her last dose is tomorrow.  She reports that the prednisone has relieved her lower back pain.  She denies any other joint pain or joint swelling at this time.  She denies any morning stiffness.  She denies any recent rashes or photosensitivity.  She has not had any symptoms of Raynaud's.  She denies any sores in her mouth or nose.  She has not had any sicca symptoms.  She denies any fever, fatigue, or swollen lymph nodes.  She denies any palpitations or shortness of breath.    Activities of Daily Living:  Patient reports morning stiffness for 0 minutes.   Patient Denies nocturnal pain.  Difficulty dressing/grooming: Denies Difficulty climbing stairs: Denies Difficulty getting out of chair: Denies Difficulty using hands for taps, buttons, cutlery, and/or writing: Denies  Review of Systems  Constitutional: Negative for fatigue.  HENT: Negative for mouth sores, mouth dryness and nose dryness.   Eyes: Negative for pain, itching, visual disturbance and dryness.  Respiratory: Negative for cough, hemoptysis, shortness of breath, wheezing and difficulty breathing.   Cardiovascular: Negative for chest pain, palpitations, hypertension and swelling in legs/feet.  Gastrointestinal: Negative for abdominal pain, blood in stool, constipation and diarrhea.  Endocrine: Negative for increased urination.  Genitourinary: Negative  for painful urination.  Musculoskeletal: Negative for arthralgias, joint pain, joint swelling, myalgias, muscle weakness, morning stiffness, muscle tenderness and myalgias.  Skin: Negative for color change, pallor, rash, hair loss, nodules/bumps, redness, skin tightness, ulcers and sensitivity to sunlight.  Allergic/Immunologic: Negative for susceptible to infections.  Neurological: Negative for dizziness, numbness, headaches, memory loss and weakness.  Hematological: Negative for bruising/bleeding tendency and swollen glands.  Psychiatric/Behavioral: Negative for depressed mood, confusion and sleep disturbance. The patient is not nervous/anxious.     PMFS History:  Patient Active Problem List   Diagnosis Date Noted   Endometrial polyp 01/08/2018   Postmenopausal bleeding 12/31/2017   B12 deficiency 04/12/2017   Essential hypertension 01/06/2016   Chronic sinusitis 05/02/2015   Abnormal ECG 04/07/2015   Bloodgood disease 04/07/2015   Acid reflux 04/07/2015   Low back pain with sciatica 04/07/2015   Adiposity 04/07/2015   Hemorrhage, postmenopausal 04/07/2015   Pain 04/07/2015   Avitaminosis D 04/07/2015    Past Medical History:  Diagnosis Date   Family history of adverse reaction to anesthesia    mother had problems   GERD (gastroesophageal reflux disease)    Hypertension     Family History  Problem Relation Age of Onset   Hypertension Mother    Diabetes Mother    Breast cancer Mother [id number]   Pneumonia Paternal Grandmother    Leukemia Paternal Grandfather    Breast cancer Maternal Grandmother [id number]   Heart attack Paternal Uncle    Kidney cancer Maternal Grandfather  Past Surgical History:  Procedure Laterality Date   BIOPSY ENDOMETRIAL  01/21/2014   BREAST CYST EXCISION Right 1983   DILATATION & CURETTAGE/HYSTEROSCOPY WITH MYOSURE N/A 03/04/2018   Procedure: DILATATION & CURETTAGE/HYSTEROSCOPY;  Surgeon: Conard NovakJackson, Stephen D, MD;  Location: [facility]  ORS;  Service: Gynecology;  Laterality: N/A;   TUBAL LIGATION     Social History   Social History Narrative   Not on file   Immunization History  Administered Date(s) Administered   Td 12/26/2018   Tdap 10/27/2008     Objective: Vital Signs: BP 124/77 (BP Location: Left Arm, Patient Position: Sitting, Cuff Size: Large)    Pulse 93    Resp 13    Ht 5\' 4"  (1.626 m)    Wt 217 lb (98.4 kg)    LMP 11/19/2015    BMI 37.25 kg/m    Physical Exam Vitals signs and nursing note reviewed.  Constitutional:      Appearance: She is well-developed.  HENT:     Head: Normocephalic and atraumatic.  Eyes:     Conjunctiva/sclera: Conjunctivae normal.  Neck:     Musculoskeletal: Normal range of motion.  Cardiovascular:     Rate and Rhythm: Normal rate and regular rhythm.     Heart sounds: Normal heart sounds.  Pulmonary:     Effort: Pulmonary effort is normal.     Breath sounds: Normal breath sounds.  Abdominal:     General: Bowel sounds are normal.     Palpations: Abdomen is soft.  Lymphadenopathy:     Cervical: No cervical adenopathy.  Skin:    General: Skin is warm and dry.     Capillary Refill: Capillary refill takes less than 2 seconds.  Neurological:     Mental Status: She is alert and oriented to person, place, and time.  Psychiatric:        Behavior: Behavior normal.      Musculoskeletal Exam: C-spine, thoracic spine, lumbar spine good range of motion.  Mild midline spinal tenderness in lumbar region. No SI joint tenderness.  Shoulder joints, elbow joints, wrist joints, MCPs, PIPs, DIPs good range of motion no synovitis.  Hip joints, knee joints, ankle joints, MTPs, PIPs, DIPs good range of motion no synovitis.  No warmth or effusion of bilateral knee joints.  She has bilateral knee crepitus.  No tenderness or swelling of ankle joints.  No tenderness over trochanteric bursa bilaterally.  CDAI Exam: CDAI Score: -- Patient Global: --; Provider Global: -- Swollen: --; Tender:  -- Joint Exam   No joint exam has been documented for this visit   There is currently no information documented on the homunculus. Go to the Rheumatology activity and complete the homunculus joint exam.  Investigation: No additional findings.  Imaging: No results found.  Recent Labs: Lab Results  Component Value Date   WBC 5.4 04/13/2019   HGB 12.9 04/13/2019   PLT 243 04/13/2019   NA 140 03/05/2019   K 4.6 03/05/2019   CL 105 03/05/2019   CO2 21 03/05/2019   GLUCOSE 84 03/05/2019   BUN 14 03/05/2019   CREATININE 0.76 03/05/2019   BILITOT 0.8 07/14/2018   ALKPHOS 97 07/14/2018   AST 23 07/14/2018   ALT 21 07/14/2018   PROT 7.0 07/14/2018   ALBUMIN 4.6 07/14/2018   CALCIUM 9.4 03/05/2019   GFRAA 102 03/05/2019  May 08, 2019 UA negative, ANA negative, ENA negative, lupus anticoagulant negative, anticardiolipin IgG 22, beta-2 GP 1-  Speciality Comments: No specialty comments available.  Procedures:  No procedures performed Allergies: Lisinopril   Assessment / Plan:     Visit Diagnoses: Positive ANA (antinuclear antibody) - Repeat ANA negative, ENA negative: She has no clinical features of autoimmune disease at this time.  Lab work from 05/08/2019 was reviewed with the patient today in the office.  All questions were addressed.  We will recheck anticardiolipin antibodies in 6 months.  A future order was placed today. She has no synovitis on exam.  She has no joint pain or joint swelling at this time.  She continues to have chronic lower back pain and is currently on prednisone 20 mg by mouth daily prescribed by her PCP.  She experiences intermittent bilateral sciatica which resolves with prednisone.  She was given a handout of back exercises to perform. She has not had any recent rashes, photosensitivity, hair loss, or symptoms of Raynaud's.  No digital stations or signs of gangrene were noted.  She has not had any sicca symptoms.  No oral or nasal ulcerations.  She has not  had any recent fevers, fatigue, or swollen lymph nodes.  She denies any shortness of breath or palpitations.  We reviewed potential signs and symptoms to monitor for.  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  Chronic midline low back pain with bilateral sciatica-she has chronic lower back pain.  She has midline spinal tenderness in the lumbar region.  She experiences intermittent bilateral sciatica.  She is followed by her PCP.  She is currently on prednisone 20 mg by mouth daily.  Her last dose of prednisone is tomorrow.  Her symptoms of sciatica have resolved.  She was given a handout of back exercises to perform at home.  She declined physical therapy at this time.  Other medical conditions are listed as follows:   Essential hypertension-blood pressure is well controlled.  Avitaminosis D   Orders: Orders Placed This Encounter  Procedures   Cardiolipin antibodies, IgG, IgM, IgA   No orders of the defined types were placed in this encounter.     Follow-Up Instructions: Return if symptoms worsen or fail to improve, for Low back pain .   Ofilia Neas, PA-C   I examined and evaluated the patient with Hazel Sams PA.  We had detailed discussion regarding the lab work with the patient.  She has low titer positive anticardiolipin antibody which is not significant.  Patient is interested in getting it repeated.  Have advised her to come back in 6 months for repeat anticardiolipin antibody test.  We will call her with the results.  The plan of care was discussed as noted above.  Bo Merino, MD  Note - This record has been created using Editor, commissioning.  Chart creation errors have been sought, but may not always  have been located. Such creation errors do not reflect on  the standard of medical care.

## 2019-06-03 ENCOUNTER — Other Ambulatory Visit: Payer: Self-pay

## 2019-06-03 ENCOUNTER — Encounter: Payer: Self-pay | Admitting: Rheumatology

## 2019-06-03 ENCOUNTER — Ambulatory Visit: Payer: Managed Care, Other (non HMO) | Admitting: Rheumatology

## 2019-06-03 VITALS — BP 124/77 | HR 93 | Resp 13 | Ht 64.0 in | Wt 217.0 lb

## 2019-06-03 DIAGNOSIS — G8929 Other chronic pain: Secondary | ICD-10-CM

## 2019-06-03 DIAGNOSIS — L817 Pigmented purpuric dermatosis: Secondary | ICD-10-CM | POA: Diagnosis not present

## 2019-06-03 DIAGNOSIS — R768 Other specified abnormal immunological findings in serum: Secondary | ICD-10-CM

## 2019-06-03 DIAGNOSIS — E559 Vitamin D deficiency, unspecified: Secondary | ICD-10-CM

## 2019-06-03 DIAGNOSIS — I10 Essential (primary) hypertension: Secondary | ICD-10-CM

## 2019-06-03 DIAGNOSIS — M5442 Lumbago with sciatica, left side: Secondary | ICD-10-CM

## 2019-06-03 DIAGNOSIS — M5441 Lumbago with sciatica, right side: Secondary | ICD-10-CM | POA: Diagnosis not present

## 2019-06-03 NOTE — Patient Instructions (Signed)

## 2019-06-04 ENCOUNTER — Encounter: Payer: Self-pay | Admitting: Physician Assistant

## 2019-06-11 ENCOUNTER — Ambulatory Visit: Payer: Managed Care, Other (non HMO) | Admitting: Rheumatology

## 2019-06-16 ENCOUNTER — Other Ambulatory Visit: Payer: Self-pay | Admitting: Physician Assistant

## 2019-06-16 DIAGNOSIS — Z1231 Encounter for screening mammogram for malignant neoplasm of breast: Secondary | ICD-10-CM

## 2019-06-24 ENCOUNTER — Telehealth: Payer: Self-pay | Admitting: Physician Assistant

## 2019-06-25 NOTE — Progress Notes (Signed)
Patient: Jacques NavyFelicia B Mecca Female    DOB: 03-15-1966   53 y.o.   MRN: [id number] Visit Date: 06/26/2019  Today's Provider: Margaretann LovelessJennifer M Burnette, PA-C   Chief Complaint  Patient presents with  . Back Pain  . Arm Pain   Subjective:     HPI  Patient here with back pain and arm pain right side. She reports that she has a different chair and feels like is coming from there.  She reports that her sitting position is what may be the cause. She was seen for this in July. She reports the medrol dose pak worked previously but when she got to the lower doses she noticed the pain coming back. She does report that she is now also having her husband give her massages at night with biofreeze and he reports the trigger point is still present but getting smaller. She also notices some improvements in the pain after massages. She has next week off and is hoping to make some headway with it since she will not have to sit at a computer. She denies any numbness/tingling or weakness of the hand.   Allergies  Allergen Reactions  . Lisinopril Itching    Mouth Itching     Current Outpatient Medications:  .  amLODipine (NORVASC) 5 MG tablet, TAKE 1 TABLET BY MOUTH  DAILY, Disp: 90 tablet, Rfl: 3 .  Ascorbic Acid (VITAMIN C) 1000 MG tablet, Take 2,000 mg by mouth daily., Disp: , Rfl:  .  cetirizine (ZYRTEC) 10 MG tablet, Take 10 mg by mouth daily., Disp: , Rfl:  .  Cholecalciferol (VITAMIN D-3) 5000 units TABS, Take 5,000 Units by mouth daily., Disp: , Rfl:  .  cyclobenzaprine (FLEXERIL) 5 MG tablet, Take 1 tablet (5 mg total) by mouth at bedtime. (Patient taking differently: Take 5 mg by mouth as needed. ), Disp: 90 tablet, Rfl: 1 .  fluticasone (FLONASE) 50 MCG/ACT nasal spray, Place 2 sprays into both nostrils daily. (Patient taking differently: Place 2 sprays into both nostrils daily as needed for allergies. ), Disp: 16 g, Rfl: 11 .  furosemide (LASIX) 20 MG tablet, TAKE 1 TABLET(20 MG) BY MOUTH DAILY  AS NEEDED FOR FLUID RETENTION, Disp: 90 tablet, Rfl: 1 .  ibuprofen (ADVIL,MOTRIN) 800 MG tablet, TAKE 1 TABLET BY MOUTH  EVERY 8 HOURS AS NEEDED FOR PAIN ., Disp: 270 tablet, Rfl: 1 .  loratadine (CLARITIN) 10 MG tablet, Take 10 mg by mouth daily., Disp: , Rfl:  .  predniSONE (DELTASONE) 20 MG tablet, Take 1 tablet (20 mg total) by mouth daily with breakfast. (Patient not taking: Reported on 06/26/2019), Disp: 10 tablet, Rfl: 0  Review of Systems  Constitutional: Negative for appetite change, chills, fatigue and fever.  Respiratory: Negative for chest tightness and shortness of breath.   Cardiovascular: Negative for chest pain and palpitations.  Gastrointestinal: Negative for abdominal pain, nausea and vomiting.  Musculoskeletal: Positive for arthralgias, back pain and myalgias.  Neurological: Negative for dizziness, weakness and numbness.    Social History   Tobacco Use  . Smoking status: Never Smoker  . Smokeless tobacco: Never Used  Substance Use Topics  . Alcohol use: No    Alcohol/week: 0.0 standard drinks      Objective:   BP 127/85 (BP Location: Left Arm, Patient Position: Sitting, Cuff Size: Large)   Pulse 96   Temp (!) 97.2 F (36.2 C) (Other (Comment)) Comment (Src): forehead  Resp 16   Wt 217 lb  12.8 oz (98.8 kg)   LMP 11/19/2015   BMI 37.39 kg/m  Vitals:   06/26/19 1322  BP: 127/85  Pulse: 96  Resp: 16  Temp: (!) 97.2 F (36.2 C)  TempSrc: Other (Comment)  Weight: 217 lb 12.8 oz (98.8 kg)  Body mass index is 37.39 kg/m.   Physical Exam Vitals signs reviewed.  Constitutional:      General: She is not in acute distress.    Appearance: Normal appearance. She is well-developed. She is not diaphoretic.  Neck:     Musculoskeletal: Normal range of motion and neck supple.  Pulmonary:     Effort: Pulmonary effort is normal. No respiratory distress.  Musculoskeletal:     Cervical back: She exhibits tenderness. She exhibits normal range of motion and no bony  tenderness.       Back:  Neurological:     Mental Status: She is alert.      No results found for any visits on 06/26/19.     Assessment & Plan    1. Trigger point of right shoulder region Will repeat medrol dose pak as below for patient. Continue massages with biofreeze. Discussed consideration of PT with dry needling for trigger point therapy if it continues. She voices understanding and will call if not improving or worsening.  - methylPREDNISolone (MEDROL) 4 MG TBPK tablet; 6 day taper; take as directed on package instructions  Dispense: 21 tablet; Refill: 1  2. Seasonal allergic rhinitis due to pollen Stable. Diagnosis pulled for medication refill. Continue current medical treatment plan. - cetirizine (ZYRTEC) 10 MG tablet; Take 1 tablet (10 mg total) by mouth daily.  Dispense: 90 tablet; Refill: [place], PA-C  [facility]

## 2019-06-26 ENCOUNTER — Ambulatory Visit (INDEPENDENT_AMBULATORY_CARE_PROVIDER_SITE_OTHER): Payer: Managed Care, Other (non HMO) | Admitting: Physician Assistant

## 2019-06-26 ENCOUNTER — Encounter: Payer: Self-pay | Admitting: Physician Assistant

## 2019-06-26 ENCOUNTER — Other Ambulatory Visit: Payer: Self-pay

## 2019-06-26 VITALS — BP 127/85 | HR 96 | Temp 97.2°F | Resp 16 | Wt 217.8 lb

## 2019-06-26 DIAGNOSIS — J301 Allergic rhinitis due to pollen: Secondary | ICD-10-CM | POA: Diagnosis not present

## 2019-06-26 DIAGNOSIS — M25511 Pain in right shoulder: Secondary | ICD-10-CM | POA: Diagnosis not present

## 2019-06-26 MED ORDER — METHYLPREDNISOLONE 4 MG PO TBPK: ORAL_TABLET | ORAL | 1 refills | Status: DC

## 2019-06-26 MED ORDER — CETIRIZINE HCL 10 MG PO TABS: 10.0000 mg | ORAL_TABLET | Freq: Every day | ORAL | 1 refills | Status: DC

## 2019-07-08 ENCOUNTER — Ambulatory Visit
Admission: RE | Admit: 2019-07-08 | Discharge: 2019-07-08 | Disposition: A | Discharge status: Home / Self Care | Payer: Managed Care, Other (non HMO) | Source: Ambulatory Visit | Attending: Physician Assistant | Admitting: Physician Assistant

## 2019-07-08 DIAGNOSIS — Z1231 Encounter for screening mammogram for malignant neoplasm of breast: Secondary | ICD-10-CM | POA: Diagnosis not present

## 2019-07-22 NOTE — Progress Notes (Signed)
Patient: Ashley Ballard, Female    DOB: 1966/10/17, 53 y.o.   MRN: [id number] Visit Date: 07/23/2019  Today's Provider: Margaretann Loveless, PA-C   Chief Complaint  Patient presents with  . Annual Exam   Subjective:     Annual physical exam [name] is a 53 y.o. female who presents today for health maintenance and complete physical. She feels well. She reports exercising yes/walking. She reports she is sleeping fairly well. -----------------------------------------------------------   Review of Systems  Constitutional: Negative.   HENT: Negative.   Eyes: Negative.   Respiratory: Negative.   Cardiovascular: Negative.   Gastrointestinal: Negative.   Endocrine: Negative.   Genitourinary: Negative.   Musculoskeletal: Negative.   Skin: Negative.   Allergic/Immunologic: Negative.   Neurological: Negative.   Hematological: Negative.   Psychiatric/Behavioral: Negative.     Social History      She  reports that she has never smoked. She has never used smokeless tobacco. She reports that she does not drink alcohol or use drugs.       Social History   Socioeconomic History  . Marital status: Married    Spouse name: Casimiro Needle  . Number of children: 2  . Years of education: Not on file  . Highest education level: Not on file  Occupational History  . Occupation: full time  Social Needs  . Financial resource strain: Not on file  . Food insecurity    Worry: Not on file    Inability: Not on file  . Transportation needs    Medical: Not on file    Non-medical: Not on file  Tobacco Use  . Smoking status: Never Smoker  . Smokeless tobacco: Never Used  Substance and Sexual Activity  . Alcohol use: No    Alcohol/week: 0.0 standard drinks  . Drug use: No  . Sexual activity: Yes  Lifestyle  . Physical activity    Days per week: Not on file    Minutes per session: Not on file  . Stress: Not on file  Relationships  . Social Musician on phone: Not  on file    Gets together: Not on file    Attends religious service: Not on file    Active member of club or organization: Not on file    Attends meetings of clubs or organizations: Not on file    Relationship status: Not on file  Other Topics Concern  . Not on file  Social History Narrative  . Not on file    Past Medical History:  Diagnosis Date  . Family history of adverse reaction to anesthesia    mother had problems  . GERD (gastroesophageal reflux disease)   . Hypertension      Patient Active Problem List   Diagnosis Date Noted  . Endometrial polyp 01/08/2018  . Postmenopausal bleeding 12/31/2017  . B12 deficiency 04/12/2017  . Essential hypertension 01/06/2016  . Chronic sinusitis 05/02/2015  . Abnormal ECG 04/07/2015  . 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 Acid reflux 04/07/2015  . Low back pain with sciatica 04/07/2015  . Adiposity 04/07/2015  . Hemorrhage, postmenopausal 04/07/2015  . Pain 04/07/2015  . Avitaminosis D 04/07/2015    Past Surgical History:  Procedure Laterality Date  . BIOPSY ENDOMETRIAL  01/21/2014  . BREAST CYST EXCISION Left 1983  . DILATATION & CURETTAGE/HYSTEROSCOPY WITH MYOSURE N/A 03/04/2018   Procedure: DILATATION & CURETTAGE/HYSTEROSCOPY;  Surgeon: Conard Novak, MD;  Location: [facility] ORS;  Service:  Gynecology;  Laterality: N/A;  . TUBAL LIGATION      Family History        Family Status  Relation Name Status  . Mother  Alive  . PGM  Deceased  . PGF  Deceased  . Sister 1 Alive  . Brother 1 Alive  . MGM  Deceased  . Sister 1 Alive  . Oneal GroutPat Uncle  (Not Specified)  . MGF  (Not Specified)  . Sister 1 Alive        Her family history includes Breast cancer (age of onset: 6158) in her mother; Breast cancer (age of onset: 5885) in her maternal grandmother; Diabetes in her mother; Heart attack in her paternal uncle; Hypertension in her mother; Kidney cancer in her maternal grandfather; Leukemia in her paternal grandfather; Pneumonia in her  paternal grandmother.      Allergies  Allergen Reactions  . Lisinopril Itching    Mouth Itching     Current Outpatient Medications:  .  amLODipine (NORVASC) 5 MG tablet, TAKE 1 TABLET BY MOUTH  DAILY, Disp: 90 tablet, Rfl: 3 .  Ascorbic Acid (VITAMIN C) 1000 MG tablet, Take 2,000 mg by mouth daily., Disp: , Rfl:  .  cetirizine (ZYRTEC) 10 MG tablet, Take 1 tablet (10 mg total) by mouth daily., Disp: 90 tablet, Rfl: 1 .  Cholecalciferol (VITAMIN D-3) 5000 units TABS, Take 5,000 Units by mouth daily., Disp: , Rfl:  .  fluticasone (FLONASE) 50 MCG/ACT nasal spray, Place 2 sprays into both nostrils daily. (Patient taking differently: Place 2 sprays into both nostrils daily as needed for allergies. ), Disp: 16 g, Rfl: 11 .  furosemide (LASIX) 20 MG tablet, TAKE 1 TABLET(20 MG) BY MOUTH DAILY AS NEEDED FOR FLUID RETENTION, Disp: 90 tablet, Rfl: 1 .  ibuprofen (ADVIL,MOTRIN) 800 MG tablet, TAKE 1 TABLET BY MOUTH  EVERY 8 HOURS AS NEEDED FOR PAIN ., Disp: 270 tablet, Rfl: 1 .  loratadine (CLARITIN) 10 MG tablet, Take 10 mg by mouth daily., Disp: , Rfl:  .  methylPREDNISolone (MEDROL) 4 MG TBPK tablet, 6 day taper; take as directed on package instructions (Patient not taking: Reported on 07/23/2019), Disp: 21 tablet, Rfl: 1   Patient Care Team: Reine JustBurnette, Jennifer M, PA-C as PCP - General (Physician Assistant)    Objective:    Vitals: BP 124/85 (BP Location: Left Arm, Patient Position: Sitting, Cuff Size: Large)   Pulse 92   Temp (!) 97.1 F (36.2 C) (Other (Comment))   Resp 16   Ht 5\' 4"  (1.626 m)   Wt 222 lb (100.7 kg)   LMP 11/19/2015   SpO2 97%   BMI 38.11 kg/m    Vitals:   07/23/19 0915  BP: 124/85  Pulse: 92  Resp: 16  Temp: (!) 97.1 F (36.2 C)  TempSrc: Other (Comment)  SpO2: 97%  Weight: 222 lb (100.7 kg)  Height: 5\' 4"  (1.626 m)     Physical Exam Vitals signs reviewed.  Constitutional:      General: She is not in acute distress.    Appearance: Normal appearance.  She is well-developed. She is obese. She is not diaphoretic.  HENT:     Head: Normocephalic and atraumatic.     Right Ear: Tympanic membrane, ear canal and external ear normal.     Left Ear: Tympanic membrane, ear canal and external ear normal.     Nose: Nose normal.     Mouth/Throat:     Mouth: Mucous membranes are moist.  Pharynx: No oropharyngeal exudate.  Eyes:     General: No scleral icterus.       Right eye: No discharge.        Left eye: No discharge.     Extraocular Movements: Extraocular movements intact.     Conjunctiva/sclera: Conjunctivae normal.     Pupils: Pupils are equal, round, and reactive to light.  Neck:     Musculoskeletal: Normal range of motion and neck supple.     Thyroid: No thyromegaly.     Vascular: No carotid bruit or JVD.     Trachea: No tracheal deviation.  Cardiovascular:     Rate and Rhythm: Normal rate and regular rhythm.     Pulses: Normal pulses.     Heart sounds: Normal heart sounds. No murmur. No friction rub. No gallop.   Pulmonary:     Effort: Pulmonary effort is normal. No respiratory distress.     Breath sounds: Normal breath sounds. No wheezing or rales.  Chest:     Chest wall: No tenderness.  Abdominal:     General: Abdomen is flat. Bowel sounds are normal. There is no distension.     Palpations: Abdomen is soft. There is no mass.     Tenderness: There is no abdominal tenderness. There is no guarding or rebound.  Musculoskeletal: Normal range of motion.        General: No tenderness.     Right lower leg: No edema.     Left lower leg: No edema.  Lymphadenopathy:     Cervical: No cervical adenopathy.  Skin:    General: Skin is warm and dry.     Capillary Refill: Capillary refill takes less than 2 seconds.     Findings: No rash.  Neurological:     General: No focal deficit present.     Mental Status: She is alert and oriented to person, place, and time. Mental status is at baseline.  Psychiatric:        Mood and Affect: Mood  normal.        Behavior: Behavior normal.        Thought Content: Thought content normal.        Judgment: Judgment normal.      Depression Screen PHQ 2/9 Scores 07/23/2019 07/14/2018 04/12/2017  PHQ - 2 Score 0 0 0  PHQ- 9 Score 0 - 0       Assessment & Plan:     Routine Health Maintenance and Physical Exam  Exercise Activities and Dietary recommendations Goals   None     Immunization History  Administered Date(s) Administered  . Td 12/26/2018  . Tdap 10/27/2008    Health Maintenance  Topic Date Due  . INFLUENZA VACCINE  05/22/2020 (Originally 05/23/2019)  . PAP SMEAR-Modifier  12/31/2020  . MAMMOGRAM  07/07/2021  . COLONOSCOPY  03/17/2024  . TETANUS/TDAP  12/25/2028  . Hepatitis C Screening  Completed  . HIV Screening  Completed     Discussed health benefits of physical activity, and encouraged her to engage in regular exercise appropriate for her age and condition.    1. Annual physical exam Normal physical exam today. Will check labs as below and f/u pending lab results. If labs are stable and WNL she will not need to have these rechecked for one year at her next annual physical exam. She is to call the office in the meantime if she has any acute issue, questions or concerns. - CBC w/Diff/Platelet - Comprehensive Metabolic Panel (CMET) - TSH -  Lipid Profile - HgB A1c  2. Encounter for biometric screening Labs collected as above.   3. Encounter for breast cancer screening using non-mammogram modality Mammogram done 07/08/19 and normal.   4. Essential hypertension Stable. Continue Amlodipine 5mg  daily and furosemide 20mg  prn. Will check labs as below and f/u pending results. - CBC w/Diff/Platelet - Comprehensive Metabolic Panel (CMET) - TSH - Lipid Profile - HgB A1c  5. Vitamin D deficiency H/O this and post menopausal. Will check labs as below and f/u pending results. - Vitamin D (25 hydroxy)   --------------------------------------------------------------------    Mar Daring, PA-C  [facility]

## 2019-07-23 ENCOUNTER — Other Ambulatory Visit: Payer: Self-pay

## 2019-07-23 ENCOUNTER — Other Ambulatory Visit: Payer: Self-pay | Admitting: Physician Assistant

## 2019-07-23 ENCOUNTER — Ambulatory Visit (INDEPENDENT_AMBULATORY_CARE_PROVIDER_SITE_OTHER): Payer: Managed Care, Other (non HMO) | Admitting: Physician Assistant

## 2019-07-23 ENCOUNTER — Encounter: Payer: Self-pay | Admitting: Physician Assistant

## 2019-07-23 VITALS — BP 124/85 | HR 92 | Temp 97.1°F | Resp 16 | Ht 64.0 in | Wt 222.0 lb

## 2019-07-23 DIAGNOSIS — I10 Essential (primary) hypertension: Secondary | ICD-10-CM

## 2019-07-23 DIAGNOSIS — E559 Vitamin D deficiency, unspecified: Secondary | ICD-10-CM

## 2019-07-23 DIAGNOSIS — Z0000 Encounter for general adult medical examination without abnormal findings: Secondary | ICD-10-CM | POA: Diagnosis not present

## 2019-07-23 DIAGNOSIS — Z008 Encounter for other general examination: Secondary | ICD-10-CM

## 2019-07-23 DIAGNOSIS — Z1239 Encounter for other screening for malignant neoplasm of breast: Secondary | ICD-10-CM

## 2019-07-23 NOTE — Patient Instructions (Signed)

## 2019-07-24 LAB — CBC WITH DIFFERENTIAL/PLATELET
Basophils Absolute: 0 10*3/uL (ref 0.0–0.2)
Basos: 1 %
EOS (ABSOLUTE): 0.1 10*3/uL (ref 0.0–0.4)
Eos: 3 %
Hematocrit: 37.1 % (ref 34.0–46.6)
Hemoglobin: 12.4 g/dL (ref 11.1–15.9)
Immature Grans (Abs): 0 10*3/uL (ref 0.0–0.1)
Immature Granulocytes: 0 %
Lymphocytes Absolute: 1.4 10*3/uL (ref 0.7–3.1)
Lymphs: 33 %
MCH: 31.6 pg (ref 26.6–33.0)
MCHC: 33.4 g/dL (ref 31.5–35.7)
MCV: 94 fL (ref 79–97)
Monocytes Absolute: 0.4 10*3/uL (ref 0.1–0.9)
Monocytes: 9 %
Neutrophils Absolute: 2.3 10*3/uL (ref 1.4–7.0)
Neutrophils: 54 %
Platelets: 174 10*3/uL (ref 150–450)
RBC: 3.93 x10E6/uL (ref 3.77–5.28)
RDW: 12.1 % (ref 11.7–15.4)
WBC: 4.2 10*3/uL (ref 3.4–10.8)

## 2019-07-24 LAB — COMPREHENSIVE METABOLIC PANEL
ALT: 20 IU/L (ref 0–32)
AST: 17 IU/L (ref 0–40)
Albumin/Globulin Ratio: 2 (ref 1.2–2.2)
Albumin: 4.5 g/dL (ref 3.8–4.9)
Alkaline Phosphatase: 97 IU/L (ref 39–117)
BUN/Creatinine Ratio: 20 (ref 9–23)
BUN: 14 mg/dL (ref 6–24)
Bilirubin Total: 0.9 mg/dL (ref 0.0–1.2)
CO2: 21 mmol/L (ref 20–29)
Calcium: 9 mg/dL (ref 8.7–10.2)
Chloride: 104 mmol/L (ref 96–106)
Creatinine, Ser: 0.71 mg/dL (ref 0.57–1.00)
GFR calc Af Amer: 111 mL/min/{1.73_m2} (ref >59)
GFR calc non Af Amer: 96 mL/min/{1.73_m2} (ref >59)
Globulin, Total: 2.3 g/dL (ref 1.5–4.5)
Glucose: 82 mg/dL (ref 65–99)
Potassium: 4.1 mmol/L (ref 3.5–5.2)
Sodium: 143 mmol/L (ref 134–144)
Total Protein: 6.8 g/dL (ref 6.0–8.5)

## 2019-07-24 LAB — LIPID PANEL
Chol/HDL Ratio: 3.3 ratio (ref 0.0–4.4)
Cholesterol, Total: 161 mg/dL (ref 100–199)
HDL: 49 mg/dL (ref >39)
LDL Chol Calc (NIH): 99 mg/dL (ref 0–99)
Triglycerides: 68 mg/dL (ref 0–149)
VLDL Cholesterol Cal: 13 mg/dL (ref 5–40)

## 2019-07-24 LAB — TSH: TSH: 1.13 u[IU]/mL (ref 0.450–4.500)

## 2019-07-24 LAB — VITAMIN D 25 HYDROXY (VIT D DEFICIENCY, FRACTURES): Vit D, 25-Hydroxy: 43.9 ng/mL (ref 30.0–100.0)

## 2019-07-24 LAB — HEMOGLOBIN A1C
Est. average glucose Bld gHb Est-mCnc: 97 mg/dL
Hgb A1c MFr Bld: 5 % (ref 4.8–5.6)

## 2019-09-10 NOTE — Progress Notes (Signed)
Patient: Ashley Ballard Female    DOB: Jan 21, 1966   53 y.o.   MRN: [id number] Visit Date: 09/11/2019  Today's Provider: Margaretann Loveless, PA-C   Chief Complaint  Patient presents with  . URI  . Sinusitis   Subjective:     Virtual Visit via Video Note  I connected with Ashley Ballard on 09/11/19 at  9:40 AM EST by a video enabled telemedicine application and verified that I am speaking with the correct person using two identifiers.  Location: Patient: Home Provider: [facility]   I discussed the limitations of evaluation and management by telemedicine and the availability of in person appointments. The patient expressed understanding and agreed to proceed.  URI  The current episode started 1 to 4 weeks ago. The problem has been gradually improving. There has been no fever. Associated symptoms include congestion, coughing, ear pain and sinus pain. Pertinent negatives include no abdominal pain, chest pain, diarrhea, headaches, joint swelling, nausea, rhinorrhea, sore throat, vomiting or wheezing. She has tried decongestant for the symptoms. The treatment provided mild relief.      Allergies  Allergen Reactions  . Lisinopril Itching    Mouth Itching     Current Outpatient Medications:  .  amLODipine (NORVASC) 5 MG tablet, TAKE 1 TABLET BY MOUTH  DAILY, Disp: 90 tablet, Rfl: 3 .  Ascorbic Acid (VITAMIN C) 1000 MG tablet, Take 2,000 mg by mouth daily., Disp: , Rfl:  .  cetirizine (ZYRTEC) 10 MG tablet, Take 1 tablet (10 mg total) by mouth daily., Disp: 90 tablet, Rfl: 1 .  Cholecalciferol (VITAMIN D-3) 5000 units TABS, Take 5,000 Units by mouth daily., Disp: , Rfl:  .  fluticasone (FLONASE) 50 MCG/ACT nasal spray, Place 2 sprays into both nostrils daily. (Patient taking differently: Place 2 sprays into both nostrils daily as needed for allergies. ), Disp: 16 g, Rfl: 11 .  furosemide (LASIX) 20 MG tablet, TAKE 1 TABLET(20 MG) BY MOUTH DAILY AS NEEDED FOR FLUID RETENTION, Disp:  90 tablet, Rfl: 1 .  ibuprofen (ADVIL,MOTRIN) 800 MG tablet, TAKE 1 TABLET BY MOUTH  EVERY 8 HOURS AS NEEDED FOR PAIN ., Disp: 270 tablet, Rfl: 1 .  loratadine (CLARITIN) 10 MG tablet, Take 10 mg by mouth daily., Disp: , Rfl:   Review of Systems  Constitutional: Negative.  Negative for fatigue and fever.  HENT: Positive for congestion, ear pain, postnasal drip, sinus pressure and sinus pain. Negative for rhinorrhea, sore throat and trouble swallowing.   Respiratory: Positive for cough. Negative for chest tightness, shortness of breath and wheezing.   Cardiovascular: Negative for chest pain, palpitations and leg swelling.  Gastrointestinal: Negative for abdominal pain, diarrhea, nausea and vomiting.  Neurological: Negative for dizziness and headaches.    Social History   Tobacco Use  . Smoking status: Never Smoker  . Smokeless tobacco: Never Used  Substance Use Topics  . Alcohol use: No    Alcohol/week: 0.0 standard drinks      Objective:   Temp 97.8 F (36.6 C) (Oral)   LMP 11/19/2015  Vitals:   09/11/19 0935  Temp: 97.8 F (36.6 C)  TempSrc: Oral  There is no height or weight on file to calculate BMI.   Physical Exam Vitals signs reviewed.  Constitutional:      Appearance: Normal appearance. She is well-developed.  HENT:     Head: Normocephalic and atraumatic.  Neck:     Musculoskeletal: Normal range of motion and neck supple.  Pulmonary:     Effort: Pulmonary effort is normal. No respiratory distress.  Neurological:     Mental Status: She is alert.  Psychiatric:        Mood and Affect: Mood normal.        Behavior: Behavior normal.        Thought Content: Thought content normal.        Judgment: Judgment normal.     No results found for any visits on 09/11/19.     Assessment & Plan    1. Acute non-recurrent pansinusitis Worsening symptoms that have not responded to OTC medications. Will give Zpak and prednisone as below. Continue allergy medications.  Stay well hydrated and get plenty of rest. Call if no symptom improvement or if symptoms worsen. - predniSONE (STERAPRED UNI-PAK 21 TAB) 10 MG (21) TBPK tablet; 6 day taper; take as directed on package instructions  Dispense: 21 tablet; Refill: 0 - azithromycin (ZITHROMAX) 250 MG tablet; Take 2 tablets PO on day one, and one tablet PO daily thereafter until completed.  Dispense: 6 tablet; Refill: 0  2. Antibiotic-induced yeast infection Gets yeast infections with antibiotics. Diflucan given as below.  - fluconazole (DIFLUCAN) 150 MG tablet; Take 1 tablet (150 mg total) by mouth once for 1 dose.  Dispense: 1 tablet; Refill: 0  3. Other chronic sinusitis Stable. Diagnosis pulled for medication refill. Continue current medical treatment plan. - fluticasone (FLONASE) 50 MCG/ACT nasal spray; Place 2 sprays into both nostrils daily.  Dispense: 16 g; Refill: 11  I discussed the assessment and treatment plan with the patient. The patient was provided an opportunity to ask questions and all were answered. The patient agreed with the plan and demonstrated an understanding of the instructions.   The patient was advised to call back or seek an in-person evaluation if the symptoms worsen or if the condition fails to improve as anticipated.  I provided 12 minutes of non-face-to-face time during this encounter.  Mar Daring, PA-C  [facility]

## 2019-09-11 ENCOUNTER — Telehealth (INDEPENDENT_AMBULATORY_CARE_PROVIDER_SITE_OTHER): Payer: Managed Care, Other (non HMO) | Admitting: Physician Assistant

## 2019-09-11 ENCOUNTER — Encounter: Payer: Self-pay | Admitting: Physician Assistant

## 2019-09-11 VITALS — Temp 97.8°F

## 2019-09-11 DIAGNOSIS — J328 Other chronic sinusitis: Secondary | ICD-10-CM | POA: Diagnosis not present

## 2019-09-11 DIAGNOSIS — B379 Candidiasis, unspecified: Secondary | ICD-10-CM | POA: Diagnosis not present

## 2019-09-11 DIAGNOSIS — T3695XA Adverse effect of unspecified systemic antibiotic, initial encounter: Secondary | ICD-10-CM

## 2019-09-11 DIAGNOSIS — J0140 Acute pansinusitis, unspecified: Secondary | ICD-10-CM

## 2019-09-11 MED ORDER — PREDNISONE 10 MG (21) PO TBPK: ORAL_TABLET | ORAL | 0 refills | Status: DC

## 2019-09-11 MED ORDER — AZITHROMYCIN 250 MG PO TABS: ORAL_TABLET | ORAL | 0 refills | Status: DC

## 2019-09-11 MED ORDER — FLUTICASONE PROPIONATE 50 MCG/ACT NA SUSP: 2.0000 | Freq: Every day | NASAL | 11 refills | Status: DC

## 2019-09-11 MED ORDER — FLUCONAZOLE 150 MG PO TABS: 150.0000 mg | ORAL_TABLET | Freq: Once | ORAL | 0 refills | Status: AC

## 2019-09-11 NOTE — Patient Instructions (Signed)

## 2019-09-13 ENCOUNTER — Encounter: Payer: Self-pay | Admitting: Physician Assistant

## 2019-09-13 DIAGNOSIS — J0140 Acute pansinusitis, unspecified: Secondary | ICD-10-CM

## 2019-09-14 MED ORDER — AMOXICILLIN-POT CLAVULANATE 875-125 MG PO TABS: 1.0000 | ORAL_TABLET | Freq: Two times a day (BID) | ORAL | 0 refills | Status: DC

## 2019-10-15 ENCOUNTER — Encounter: Payer: Self-pay | Admitting: Physician Assistant

## 2019-10-26 ENCOUNTER — Other Ambulatory Visit: Payer: Self-pay

## 2019-10-26 ENCOUNTER — Ambulatory Visit: Payer: Managed Care, Other (non HMO) | Admitting: Physician Assistant

## 2019-10-26 ENCOUNTER — Ambulatory Visit (INDEPENDENT_AMBULATORY_CARE_PROVIDER_SITE_OTHER): Payer: Managed Care, Other (non HMO) | Admitting: Physician Assistant

## 2019-10-26 DIAGNOSIS — Z23 Encounter for immunization: Secondary | ICD-10-CM

## 2019-11-25 ENCOUNTER — Other Ambulatory Visit: Payer: Self-pay

## 2019-11-25 ENCOUNTER — Encounter: Payer: Self-pay | Admitting: Physician Assistant

## 2019-11-25 DIAGNOSIS — M5440 Lumbago with sciatica, unspecified side: Secondary | ICD-10-CM

## 2019-11-25 DIAGNOSIS — G8929 Other chronic pain: Secondary | ICD-10-CM

## 2019-11-25 MED ORDER — IBUPROFEN 800 MG PO TABS: 800.0000 mg | ORAL_TABLET | Freq: Three times a day (TID) | ORAL | 1 refills | Status: DC | PRN

## 2019-12-29 ENCOUNTER — Encounter: Payer: Self-pay | Admitting: Physician Assistant

## 2020-02-15 ENCOUNTER — Other Ambulatory Visit: Payer: Self-pay | Admitting: Physician Assistant

## 2020-02-15 DIAGNOSIS — J301 Allergic rhinitis due to pollen: Secondary | ICD-10-CM

## 2020-03-10 ENCOUNTER — Other Ambulatory Visit: Payer: Self-pay | Admitting: Physician Assistant

## 2020-03-10 DIAGNOSIS — R600 Localized edema: Secondary | ICD-10-CM

## 2020-03-10 NOTE — Telephone Encounter (Signed)
Requested medication (s) are due for refill today: no  Requested medication (s) are on the active medication list: yes  Last refill:  01/14/2019  Future visit scheduled: no  Notes to clinic:  script expired 01/14/2020  Please review for use and refill   Requested Prescriptions  Pending Prescriptions Disp Refills   furosemide (LASIX) 20 MG tablet [Pharmacy Med Name: FUROSEMIDE 20MG  TABLETS] 90 tablet 1    Sig: TAKE 1 TABLET(20 MG) BY MOUTH DAILY AS NEEDED FOR FLUID RETENTION      Cardiovascular:  Diuretics - Loop Failed - 03/10/2020  8:18 AM      Failed - Valid encounter within last 6 months    Recent Outpatient Visits           6 months ago Acute non-recurrent pansinusitis   [facility] [place], [place], PA-C   7 months ago Annual physical exam   [facility] M, M   8 months ago Trigger point of right shoulder region   [facility], [place], [place]   9 months ago Trigger point of right shoulder region   [facility], Alessandra Bevels   11 months ago Muscle cramps   [facility] M, M              Passed - K in normal range and within 360 days    Potassium  Date Value Ref Range Status  07/23/2019 4.1 3.5 - 5.2 mmol/L Final  02/26/2014 3.8 3.5 - 5.1 mmol/L Final          Passed - Ca in normal range and within 360 days    Calcium  Date Value Ref Range Status  07/23/2019 9.0 8.7 - 10.2 mg/dL Final   Calcium, Total  Date Value Ref Range Status  02/26/2014 9.0 8.5 - 10.1 mg/dL Final          Passed - Na in normal range and within 360 days    Sodium  Date Value Ref Range Status  07/23/2019 143 134 - 144 mmol/L Final  02/26/2014 139 136 - 145 mmol/L Final          Passed - Cr in normal range and within 360 days    Creatinine  Date Value Ref Range Status  02/26/2014 0.77 0.60 - 1.30 mg/dL Final   Creatinine, Ser  Date  Value Ref Range Status  07/23/2019 0.71 0.57 - 1.00 mg/dL Final          Passed - Last BP in normal range    BP Readings from Last 1 Encounters:  07/23/19 124/85

## 2020-04-07 NOTE — Progress Notes (Signed)
Established patient visit   Patient: Ashley Ballard   DOB: 09/25/1966   54 y.o. Female  MRN: [id number] Visit Date: 04/08/2020  Today's healthcare provider: Margaretann Loveless, PA-C   Chief Complaint  Patient presents with  . Follow-up    HTN   Subjective    HPI Hypertension, follow-up  BP Readings from Last 3 Encounters:  04/08/20 136/83  07/23/19 124/85  06/26/19 127/85   Wt Readings from Last 3 Encounters:  04/08/20 232 lb (105.2 kg)  07/23/19 222 lb (100.7 kg)  06/26/19 217 lb 12.8 oz (98.8 kg)     She was last seen for hypertension 8 months ago.  BP at that visit was 124/85. Management since that visit includes Stable. Continue Amlodipine 5mg  daily and furosemide 20mg  prn  She reports excellent compliance with treatment. She is not having side effects.  She is following a Low Sodium diet. She is exercising. Walking 4 days a week. She does not smoke.  Outside blood pressures are 130's-140's/ 70-90's. Symptoms: No chest pain No chest pressure  No palpitations No syncope  No dyspnea No orthopnea  No paroxysmal nocturnal dyspnea No lower extremity edema   Pertinent labs: Lab Results  Component Value Date   CHOL 161 07/23/2019   HDL 49 07/23/2019   LDLCALC 99 07/23/2019   TRIG 68 07/23/2019   CHOLHDL 3.3 07/23/2019   Lab Results  Component Value Date   NA 143 07/23/2019   K 4.1 07/23/2019   CREATININE 0.71 07/23/2019   GFRNONAA 96 07/23/2019   GFRAA 111 07/23/2019   GLUCOSE 82 07/23/2019     The 10-year ASCVD risk score 09/22/2019 DC Jr., et al., 2013) is: 5.8%   --------------------------------------------------------------------------------------------------- Burning urination: This started on Wednesday with having some mild burning at the end of the urine stream.Reports that after urinating a few minutes after she feels like going again. It feels like she is not able to empty her bladder all the way.  Patient Active Problem List   Diagnosis  Date Noted  . Endometrial polyp 01/08/2018  . Postmenopausal bleeding 12/31/2017  . B12 deficiency 04/12/2017  . Essential hypertension 01/06/2016  . Chronic sinusitis 05/02/2015  . Abnormal ECG 04/07/2015  . 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 Acid reflux 04/07/2015  . Low back pain with sciatica 04/07/2015  . Adiposity 04/07/2015  . Hemorrhage, postmenopausal 04/07/2015  . Pain 04/07/2015  . Avitaminosis D 04/07/2015   Past Medical History:  Diagnosis Date  . Family history of adverse reaction to anesthesia    mother had problems  . GERD (gastroesophageal reflux disease)   . Hypertension        Medications: Outpatient Medications Prior to Visit  Medication Sig  . Ascorbic Acid (VITAMIN C) 1000 MG tablet Take 2,000 mg by mouth daily.  . cetirizine (ZYRTEC) 10 MG tablet TAKE 1 TABLET(10 MG) BY MOUTH DAILY  . Cholecalciferol (VITAMIN D-3) 5000 units TABS Take 5,000 Units by mouth daily.  . fluticasone (FLONASE) 50 MCG/ACT nasal spray Place 2 sprays into both nostrils daily.  04/09/2015 loratadine (CLARITIN) 10 MG tablet Take 10 mg by mouth daily.  . [DISCONTINUED] amLODipine (NORVASC) 5 MG tablet TAKE 1 TABLET BY MOUTH  DAILY  . [DISCONTINUED] furosemide (LASIX) 20 MG tablet TAKE 1 TABLET(20 MG) BY MOUTH DAILY AS NEEDED FOR FLUID RETENTION  . [DISCONTINUED] ibuprofen (ADVIL) 800 MG tablet Take 1 tablet (800 mg total) by mouth every 8 (eight) hours as needed. for pain  . [DISCONTINUED] amoxicillin-clavulanate (  AUGMENTIN) 875-125 MG tablet Take 1 tablet by mouth 2 (two) times daily.  . [DISCONTINUED] predniSONE (STERAPRED UNI-PAK 21 TAB) 10 MG (21) TBPK tablet 6 day taper; take as directed on package instructions   No facility-administered medications prior to visit.    Review of Systems  Constitutional: Negative.   Respiratory: Negative.   Cardiovascular: Negative.   Gastrointestinal: Negative.   Genitourinary: Positive for dysuria and frequency.  Neurological: Negative.     Last  CBC Lab Results  Component Value Date   WBC 4.2 07/23/2019   HGB 12.4 07/23/2019   HCT 37.1 07/23/2019   MCV 94 07/23/2019   MCH 31.6 07/23/2019   RDW 12.1 07/23/2019   PLT 174 07/23/2019   Last metabolic panel Lab Results  Component Value Date   GLUCOSE 82 07/23/2019   NA 143 07/23/2019   K 4.1 07/23/2019   CL 104 07/23/2019   CO2 21 07/23/2019   BUN 14 07/23/2019   CREATININE 0.71 07/23/2019   GFRNONAA 96 07/23/2019   GFRAA 111 07/23/2019   CALCIUM 9.0 07/23/2019   PROT 6.8 07/23/2019   ALBUMIN 4.5 07/23/2019   LABGLOB 2.3 07/23/2019   AGRATIO 2.0 07/23/2019   BILITOT 0.9 07/23/2019   ALKPHOS 97 07/23/2019   AST 17 07/23/2019   ALT 20 07/23/2019   ANIONGAP 9 01/17/2016      Objective    BP 136/83 (BP Location: Left Arm, Patient Position: Sitting, Cuff Size: Large)   Pulse 99   Temp (!) 97 F (36.1 C) (Temporal)   Resp 16   Wt 232 lb (105.2 kg)   LMP 11/19/2015   BMI 39.82 kg/m  BP Readings from Last 3 Encounters:  04/08/20 136/83  07/23/19 124/85  06/26/19 127/85   Wt Readings from Last 3 Encounters:  04/08/20 232 lb (105.2 kg)  07/23/19 222 lb (100.7 kg)  06/26/19 217 lb 12.8 oz (98.8 kg)      Physical Exam Vitals reviewed.  Constitutional:      General: She is not in acute distress.    Appearance: Normal appearance. She is well-developed. She is obese. She is not ill-appearing.  HENT:     Head: Normocephalic and atraumatic.  Pulmonary:     Effort: Pulmonary effort is normal. No respiratory distress.  Musculoskeletal:     Cervical back: Normal range of motion and neck supple.  Neurological:     Mental Status: She is alert.  Psychiatric:        Mood and Affect: Mood normal.        Behavior: Behavior normal.        Thought Content: Thought content normal.        Judgment: Judgment normal.       Results for orders placed or performed in visit on 04/08/20  Urine Culture   Specimen: Urine   UC  Result Value Ref Range   Urine Culture,  Routine Final report    Organism ID, Bacteria No growth   POCT urinalysis dipstick  Result Value Ref Range   Color, UA light yellow    Clarity, UA clear    Glucose, UA Negative Negative   Bilirubin, UA Negative    Ketones, UA Negative    Spec Grav, UA 1.010 1.010 - 1.025   Blood, UA Negative    pH, UA 6.0 5.0 - 8.0   Protein, UA Negative Negative   Urobilinogen, UA 0.2 0.2 or 1.0 E.U./dL   Nitrite, UA Negative    Leukocytes, UA Small (1+) (  A) Negative   Appearance     Odor      Assessment & Plan     1. Burning with urination Worsening symptoms. UA positive. Will treat empirically with Augmentin. Continue to push fluids. Urine sent for culture. Will follow up pending C&S results. She is to call if symptoms do not improve or if they worsen.  - POCT urinalysis dipstick - Urine Culture  2. Essential hypertension Stable. Diagnosis pulled for medication refill. Continue current medical treatment plan. - amLODipine (NORVASC) 5 MG tablet; Take 1 tablet (5 mg total) by mouth daily.  Dispense: 90 tablet; Refill: 3  3. Suspected UTI See above medical treatment plan for #1.  - Urine Culture  4. Localized edema Stable. Diagnosis pulled for medication refill. Continue current medical treatment plan. - furosemide (LASIX) 20 MG tablet; TAKE 1 TABLET(20 MG) BY MOUTH DAILY AS NEEDED FOR FLUID RETENTION  Dispense: 90 tablet; Refill: 3  5. Chronic low back pain with sciatica, sciatica laterality unspecified, unspecified back pain laterality Stable. Diagnosis pulled for medication refill. Continue current medical treatment plan. - ibuprofen (ADVIL) 800 MG tablet; Take 1 tablet (800 mg total) by mouth every 8 (eight) hours as needed. for pain  Dispense: 270 tablet; Refill: 1  6. Class 2 severe obesity due to excess calories with serious comorbidity and body mass index (BMI) of 39.0 to 39.9 in adult [facility]) Counseled patient on healthy lifestyle modifications including dieting and exercise.     No follow-ups on file.      Reynolds Bowl, PA-C, have reviewed all documentation for this visit. The documentation on 04/10/20 for the exam, diagnosis, procedures, and orders are all accurate and complete.   Rubye Beach  [facility] [phone] (phone) [phone] (fax)  [place]

## 2020-04-08 ENCOUNTER — Encounter: Payer: Self-pay | Admitting: Physician Assistant

## 2020-04-08 ENCOUNTER — Ambulatory Visit (INDEPENDENT_AMBULATORY_CARE_PROVIDER_SITE_OTHER): Payer: Managed Care, Other (non HMO) | Admitting: Physician Assistant

## 2020-04-08 ENCOUNTER — Other Ambulatory Visit: Payer: Self-pay

## 2020-04-08 VITALS — BP 136/83 | HR 99 | Temp 97.0°F | Resp 16 | Wt 232.0 lb

## 2020-04-08 DIAGNOSIS — R600 Localized edema: Secondary | ICD-10-CM | POA: Diagnosis not present

## 2020-04-08 DIAGNOSIS — I10 Essential (primary) hypertension: Secondary | ICD-10-CM | POA: Diagnosis not present

## 2020-04-08 DIAGNOSIS — R300 Dysuria: Secondary | ICD-10-CM | POA: Diagnosis not present

## 2020-04-08 DIAGNOSIS — G8929 Other chronic pain: Secondary | ICD-10-CM

## 2020-04-08 DIAGNOSIS — R3989 Other symptoms and signs involving the genitourinary system: Secondary | ICD-10-CM

## 2020-04-08 DIAGNOSIS — M5440 Lumbago with sciatica, unspecified side: Secondary | ICD-10-CM

## 2020-04-08 DIAGNOSIS — Z6839 Body mass index (BMI) 39.0-39.9, adult: Secondary | ICD-10-CM

## 2020-04-08 LAB — POCT URINALYSIS DIPSTICK
Bilirubin, UA: NEGATIVE
Blood, UA: NEGATIVE
Glucose, UA: NEGATIVE
Ketones, UA: NEGATIVE
Nitrite, UA: NEGATIVE
Protein, UA: NEGATIVE
Spec Grav, UA: 1.01 (ref 1.010–1.025)
Urobilinogen, UA: 0.2 E.U./dL
pH, UA: 6 (ref 5.0–8.0)

## 2020-04-08 MED ORDER — FUROSEMIDE 20 MG PO TABS: ORAL_TABLET | ORAL | 3 refills | Status: DC

## 2020-04-08 MED ORDER — IBUPROFEN 800 MG PO TABS: 800.0000 mg | ORAL_TABLET | Freq: Three times a day (TID) | ORAL | 1 refills | Status: DC | PRN

## 2020-04-08 MED ORDER — AMOXICILLIN-POT CLAVULANATE 875-125 MG PO TABS: 1.0000 | ORAL_TABLET | Freq: Two times a day (BID) | ORAL | 0 refills | Status: DC

## 2020-04-08 MED ORDER — AMLODIPINE BESYLATE 5 MG PO TABS: 5.0000 mg | ORAL_TABLET | Freq: Every day | ORAL | 3 refills | Status: DC

## 2020-04-10 ENCOUNTER — Encounter: Payer: Self-pay | Admitting: Physician Assistant

## 2020-04-10 LAB — URINE CULTURE: Organism ID, Bacteria: NO GROWTH

## 2020-04-10 NOTE — Patient Instructions (Signed)
DASH Eating Plan DASH stands for "Dietary Approaches to Stop Hypertension." The DASH eating plan is a healthy eating plan that has been shown to reduce high blood pressure (hypertension). It may also reduce your risk for type 2 diabetes, heart disease, and stroke. The DASH eating plan may also help with weight loss. What are tips for following this plan?  General guidelines  Avoid eating more than 2,300 mg (milligrams) of salt (sodium) a day. If you have hypertension, you may need to reduce your sodium intake to 1,500 mg a day.  Limit alcohol intake to no more than 1 drink a day for nonpregnant women and 2 drinks a day for men. One drink equals 12 oz of beer, 5 oz of wine, or 1 oz of hard liquor.  Work with your health care provider to maintain a healthy body weight or to lose weight. Ask what an ideal weight is for you.  Get at least 30 minutes of exercise that causes your heart to beat faster (aerobic exercise) most days of the week. Activities may include walking, swimming, or biking.  Work with your health care provider or diet and nutrition specialist (dietitian) to adjust your eating plan to your individual calorie needs. Reading food labels   Check food labels for the amount of sodium per serving. Choose foods with less than 5 percent of the Daily Value of sodium. Generally, foods with less than 300 mg of sodium per serving fit into this eating plan.  To find whole grains, look for the word "whole" as the first word in the ingredient list. Shopping  Buy products labeled as "low-sodium" or "no salt added."  Buy fresh foods. Avoid canned foods and premade or frozen meals. Cooking  Avoid adding salt when cooking. Use salt-free seasonings or herbs instead of table salt or sea salt. Check with your health care provider or pharmacist before using salt substitutes.  Do not fry foods. Cook foods using healthy methods such as baking, boiling, grilling, and broiling instead.  Cook with  heart-healthy oils, such as olive, canola, soybean, or sunflower oil. Meal planning  Eat a balanced diet that includes: ? 5 or more servings of fruits and vegetables each day. At each meal, try to fill half of your plate with fruits and vegetables. ? Up to 6-8 servings of whole grains each day. ? Less than 6 oz of lean meat, poultry, or fish each day. A 3-oz serving of meat is about the same size as a deck of cards. One egg equals 1 oz. ? 2 servings of low-fat dairy each day. ? A serving of nuts, seeds, or beans 5 times each week. ? Heart-healthy fats. Healthy fats called Omega-3 fatty acids are found in foods such as flaxseeds and coldwater fish, like sardines, salmon, and mackerel.  Limit how much you eat of the following: ? Canned or prepackaged foods. ? Food that is high in trans fat, such as fried foods. ? Food that is high in saturated fat, such as fatty meat. ? Sweets, desserts, sugary drinks, and other foods with added sugar. ? Full-fat dairy products.  Do not salt foods before eating.  Try to eat at least 2 vegetarian meals each week.  Eat more home-cooked food and less restaurant, buffet, and fast food.  When eating at a restaurant, ask that your food be prepared with less salt or no salt, if possible. What foods are recommended? The items listed may not be a complete list. Talk with your dietitian about   what dietary choices are best for you. Grains Whole-grain or whole-wheat bread. Whole-grain or whole-wheat pasta. Brown rice. Oatmeal. Quinoa. Bulgur. Whole-grain and low-sodium cereals. Pita bread. Low-fat, low-sodium crackers. Whole-wheat flour tortillas. Vegetables Fresh or frozen vegetables (raw, steamed, roasted, or grilled). Low-sodium or reduced-sodium tomato and vegetable juice. Low-sodium or reduced-sodium tomato sauce and tomato paste. Low-sodium or reduced-sodium canned vegetables. Fruits All fresh, dried, or frozen fruit. Canned fruit in natural juice (without  added sugar). Meat and other protein foods Skinless chicken or turkey. Ground chicken or turkey. Pork with fat trimmed off. Fish and seafood. Egg whites. Dried beans, peas, or lentils. Unsalted nuts, nut butters, and seeds. Unsalted canned beans. Lean cuts of beef with fat trimmed off. Low-sodium, lean deli meat. Dairy Low-fat (1%) or fat-free (skim) milk. Fat-free, low-fat, or reduced-fat cheeses. Nonfat, low-sodium ricotta or cottage cheese. Low-fat or nonfat yogurt. Low-fat, low-sodium cheese. Fats and oils Soft margarine without trans fats. Vegetable oil. Low-fat, reduced-fat, or light mayonnaise and salad dressings (reduced-sodium). Canola, safflower, olive, soybean, and sunflower oils. Avocado. Seasoning and other foods Herbs. Spices. Seasoning mixes without salt. Unsalted popcorn and pretzels. Fat-free sweets. What foods are not recommended? The items listed may not be a complete list. Talk with your dietitian about what dietary choices are best for you. Grains Baked goods made with fat, such as croissants, muffins, or some breads. Dry pasta or rice meal packs. Vegetables Creamed or fried vegetables. Vegetables in a cheese sauce. Regular canned vegetables (not low-sodium or reduced-sodium). Regular canned tomato sauce and paste (not low-sodium or reduced-sodium). Regular tomato and vegetable juice (not low-sodium or reduced-sodium). Pickles. Olives. Fruits Canned fruit in a light or heavy syrup. Fried fruit. Fruit in cream or butter sauce. Meat and other protein foods Fatty cuts of meat. Ribs. Fried meat. Bacon. Sausage. Bologna and other processed lunch meats. Salami. Fatback. Hotdogs. Bratwurst. Salted nuts and seeds. Canned beans with added salt. Canned or smoked fish. Whole eggs or egg yolks. Chicken or turkey with skin. Dairy Whole or 2% milk, cream, and half-and-half. Whole or full-fat cream cheese. Whole-fat or sweetened yogurt. Full-fat cheese. Nondairy creamers. Whipped toppings.  Processed cheese and cheese spreads. Fats and oils Butter. Stick margarine. Lard. Shortening. Ghee. Bacon fat. Tropical oils, such as coconut, palm kernel, or palm oil. Seasoning and other foods Salted popcorn and pretzels. Onion salt, garlic salt, seasoned salt, table salt, and sea salt. Worcestershire sauce. Tartar sauce. Barbecue sauce. Teriyaki sauce. Soy sauce, including reduced-sodium. Steak sauce. Canned and packaged gravies. Fish sauce. Oyster sauce. Cocktail sauce. Horseradish that you find on the shelf. Ketchup. Mustard. Meat flavorings and tenderizers. Bouillon cubes. Hot sauce and Tabasco sauce. Premade or packaged marinades. Premade or packaged taco seasonings. Relishes. Regular salad dressings. Where to find more information:  National Heart, Lung, and Blood Institute: www.nhlbi.nih.gov  American Heart Association: www.heart.org Summary  The DASH eating plan is a healthy eating plan that has been shown to reduce high blood pressure (hypertension). It may also reduce your risk for type 2 diabetes, heart disease, and stroke.  With the DASH eating plan, you should limit salt (sodium) intake to 2,300 mg a day. If you have hypertension, you may need to reduce your sodium intake to 1,500 mg a day.  When on the DASH eating plan, aim to eat more fresh fruits and vegetables, whole grains, lean proteins, low-fat dairy, and heart-healthy fats.  Work with your health care provider or diet and nutrition specialist (dietitian) to adjust your eating plan to your   individual calorie needs. This information is not intended to replace advice given to you by your health care provider. Make sure you discuss any questions you have with your health care provider. Document Revised: 09/20/2017 Document Reviewed: 10/01/2016 Elsevier Patient Education  2020 Elsevier Inc.  

## 2020-04-11 ENCOUNTER — Telehealth: Payer: Self-pay

## 2020-04-11 NOTE — Telephone Encounter (Signed)
Written by Margaretann Loveless, PA-C on 04/10/2020 8:10 AM EDT Seen by patient Ashley Ballard on 04/10/2020 8:11 AM

## 2020-04-11 NOTE — Telephone Encounter (Signed)
-----   Message from Margaretann Loveless, [place] sent at 04/10/2020  8:10 AM EDT ----- Urine culture has no bacterial growth. If symptoms are improving, may continue antibiotic until completed. If no changes in symptoms, please discontinue the antibiotic.

## 2020-05-22 ENCOUNTER — Encounter: Payer: Self-pay | Admitting: Physician Assistant

## 2020-05-23 ENCOUNTER — Telehealth: Payer: Self-pay

## 2020-05-23 ENCOUNTER — Telehealth (INDEPENDENT_AMBULATORY_CARE_PROVIDER_SITE_OTHER): Payer: Managed Care, Other (non HMO) | Admitting: Physician Assistant

## 2020-05-23 ENCOUNTER — Encounter: Payer: Self-pay | Admitting: Physician Assistant

## 2020-05-23 DIAGNOSIS — B379 Candidiasis, unspecified: Secondary | ICD-10-CM

## 2020-05-23 DIAGNOSIS — J0140 Acute pansinusitis, unspecified: Secondary | ICD-10-CM | POA: Diagnosis not present

## 2020-05-23 DIAGNOSIS — T3695XA Adverse effect of unspecified systemic antibiotic, initial encounter: Secondary | ICD-10-CM | POA: Diagnosis not present

## 2020-05-23 MED ORDER — AMOXICILLIN-POT CLAVULANATE 875-125 MG PO TABS: 1.0000 | ORAL_TABLET | Freq: Two times a day (BID) | ORAL | 0 refills | Status: DC

## 2020-05-23 MED ORDER — FLUCONAZOLE 150 MG PO TABS: 150.0000 mg | ORAL_TABLET | Freq: Once | ORAL | 0 refills | Status: AC

## 2020-05-23 MED ORDER — PREDNISONE 10 MG (21) PO TBPK: ORAL_TABLET | ORAL | 0 refills | Status: DC

## 2020-05-23 NOTE — Progress Notes (Signed)
MyChart Video Visit    Virtual Visit via Video Note   This visit type was conducted due to national recommendations for restrictions regarding the COVID-19 Pandemic (e.g. social distancing) in an effort to limit this patient's exposure and mitigate transmission in our community. This patient is at least at moderate risk for complications without adequate follow up. This format is felt to be most appropriate for this patient at this time. Physical exam was limited by quality of the video and audio technology used for the visit.  I connected with Ashley Ballard by a video enabled telemedicine application and verified that I am speaking with the correct person using two identifiers.  I discussed the limitations of evaluation and management by telemedicine and the availability of in person appointments. The patient expressed understanding and agreed to proceed.  Patient location: Home Provider location: [facility]   Patient: Ashley Ballard   DOB: 1966-10-20   54 y.o. Female  MRN: [id number] Visit Date: 05/23/2020  Today's healthcare provider: Margaretann Loveless, PA-C   Chief Complaint  Patient presents with  . URI   Subjective    HPI HPI    URI    Associated symptoms inlclude sinus pain, ear pain, headache, congestion and tooth pain.  Recent episode started in the past 7 days.  The problem has been gradually worsening since onset.  The temperature has been with in normal range.  Patient  is drinking plenty of fluids.  Patient is not a smoker.          Comments    She has been using the Flonase and allergy medicine.       Last edited by Marjie Skiff, CMA on 05/23/2020  6:30 PM. (History)       Patient Active Problem List   Diagnosis Date Noted  . Endometrial polyp 01/08/2018  . Postmenopausal bleeding 12/31/2017  . B12 deficiency 04/12/2017  . Essential hypertension 01/06/2016  . Chronic sinusitis 05/02/2015  . Abnormal ECG 04/07/2015  . Bloodgood disease 04/07/2015    . Acid reflux 04/07/2015  . Low back pain with sciatica 04/07/2015  . Adiposity 04/07/2015  . Hemorrhage, postmenopausal 04/07/2015  . Pain 04/07/2015  . Avitaminosis D 04/07/2015   Past Medical History:  Diagnosis Date  . Family history of adverse reaction to anesthesia    mother had problems  . GERD (gastroesophageal reflux disease)   . Hypertension       Medications: Outpatient Medications Prior to Visit  Medication Sig  . amLODipine (NORVASC) 5 MG tablet Take 1 tablet (5 mg total) by mouth daily.  . Ascorbic Acid (VITAMIN C) 1000 MG tablet Take 2,000 mg by mouth daily.  . cetirizine (ZYRTEC) 10 MG tablet TAKE 1 TABLET(10 MG) BY MOUTH DAILY  . Cholecalciferol (VITAMIN D-3) 5000 units TABS Take 5,000 Units by mouth daily.  . fluticasone (FLONASE) 50 MCG/ACT nasal spray Place 2 sprays into both nostrils daily.  . furosemide (LASIX) 20 MG tablet TAKE 1 TABLET(20 MG) BY MOUTH DAILY AS NEEDED FOR FLUID RETENTION  . ibuprofen (ADVIL) 800 MG tablet Take 1 tablet (800 mg total) by mouth every 8 (eight) hours as needed. for pain  . loratadine (CLARITIN) 10 MG tablet Take 10 mg by mouth daily.  Marland Kitchen amoxicillin-clavulanate (AUGMENTIN) 875-125 MG tablet Take 1 tablet by mouth 2 (two) times daily.   No facility-administered medications prior to visit.    Review of Systems  Constitutional: Negative for chills and fever.  HENT: Positive for  congestion, ear pain (Right ear), postnasal drip, rhinorrhea, sinus pressure and sinus pain. Negative for sneezing, sore throat and trouble swallowing.   Eyes: Negative for visual disturbance.  Respiratory: Negative for chest tightness, shortness of breath and wheezing.   Cardiovascular: Negative for chest pain, palpitations and leg swelling.  Neurological: Negative for dizziness, light-headedness and headaches.    Last CBC Lab Results  Component Value Date   WBC 4.2 07/23/2019   HGB 12.4 07/23/2019   HCT 37.1 07/23/2019   MCV 94 07/23/2019    MCH 31.6 07/23/2019   RDW 12.1 07/23/2019   PLT 174 07/23/2019   Last metabolic panel Lab Results  Component Value Date   GLUCOSE 82 07/23/2019   NA 143 07/23/2019   K 4.1 07/23/2019   CL 104 07/23/2019   CO2 21 07/23/2019   BUN 14 07/23/2019   CREATININE 0.71 07/23/2019   GFRNONAA 96 07/23/2019   GFRAA 111 07/23/2019   CALCIUM 9.0 07/23/2019   PROT 6.8 07/23/2019   ALBUMIN 4.5 07/23/2019   LABGLOB 2.3 07/23/2019   AGRATIO 2.0 07/23/2019   BILITOT 0.9 07/23/2019   ALKPHOS 97 07/23/2019   AST 17 07/23/2019   ALT 20 07/23/2019   ANIONGAP 9 01/17/2016      Objective    LMP 11/19/2015  BP Readings from Last 3 Encounters:  04/08/20 136/83  07/23/19 124/85  06/26/19 127/85   Wt Readings from Last 3 Encounters:  04/08/20 232 lb (105.2 kg)  07/23/19 222 lb (100.7 kg)  06/26/19 217 lb 12.8 oz (98.8 kg)      Physical Exam Vitals reviewed.  Constitutional:      Appearance: Normal appearance. She is well-developed.  HENT:     Head: Normocephalic and atraumatic.  Pulmonary:     Effort: Pulmonary effort is normal. No respiratory distress.  Musculoskeletal:     Cervical back: Normal range of motion and neck supple.  Neurological:     Mental Status: She is alert.  Psychiatric:        Mood and Affect: Mood normal.        Behavior: Behavior normal.        Thought Content: Thought content normal.        Judgment: Judgment normal.       Assessment & Plan     1. Acute non-recurrent pansinusitis Worsening symptoms that have not responded to OTC medications. Will give augmentin and prednisone as below. Continue allergy medications. Stay well hydrated and get plenty of rest. Call if no symptom improvement or if symptoms worsen. - amoxicillin-clavulanate (AUGMENTIN) 875-125 MG tablet; Take 1 tablet by mouth 2 (two) times daily.  Dispense: 20 tablet; Refill: 0 - predniSONE (STERAPRED UNI-PAK 21 TAB) 10 MG (21) TBPK tablet; 6 day taper; take as directed on package  instructions  Dispense: 21 tablet; Refill: 0  2. Antibiotic-induced yeast infection Gets yeast infections with antibiotics. Diflucan given as below.  - fluconazole (DIFLUCAN) 150 MG tablet; Take 1 tablet (150 mg total) by mouth once for 1 dose. May repeat for in 48-72 hrs  Dispense: 2 tablet; Refill: 0   No follow-ups on file.     I discussed the assessment and treatment plan with the patient. The patient was provided an opportunity to ask questions and all were answered. The patient agreed with the plan and demonstrated an understanding of the instructions.   The patient was advised to call back or seek an in-person evaluation if the symptoms worsen or if the condition fails to  improve as anticipated.  I provided 11 minutes of non-face-to-face time during this encounter.  Delmer Islam, PA-C, have reviewed all documentation for this visit. The documentation on 06/12/20 for the exam, diagnosis, procedures, and orders are all accurate and complete.  Reine Just [facility] [phone] (phone) [phone] (fax)  [facility]

## 2020-05-23 NOTE — Telephone Encounter (Signed)
Copied from CRM [id number]. Topic: Appointment Scheduling - Scheduling Inquiry for Clinic >> May 23, 2020  7:59 AM Ashley Ballard wrote: Reason for CRM: pt hopes that appt on hold for 6:20 pm today with Victorino Dike is for her! I tried to schedule, but it would not let me.

## 2020-05-23 NOTE — Patient Instructions (Signed)
Sinusitis, Adult Sinusitis is inflammation of your sinuses. Sinuses are hollow spaces in the bones around your face. Your sinuses are located:  Around your eyes.  In the middle of your forehead.  Behind your nose.  In your cheekbones. Mucus normally drains out of your sinuses. When your nasal tissues become inflamed or swollen, mucus can become trapped or blocked. This allows bacteria, viruses, and fungi to grow, which leads to infection. Most infections of the sinuses are caused by a virus. Sinusitis can develop quickly. It can last for up to 4 weeks (acute) or for more than 12 weeks (chronic). Sinusitis often develops after a cold. What are the causes? This condition is caused by anything that creates swelling in the sinuses or stops mucus from draining. This includes:  Allergies.  Asthma.  Infection from bacteria or viruses.  Deformities or blockages in your nose or sinuses.  Abnormal growths in the nose (nasal polyps).  Pollutants, such as chemicals or irritants in the air.  Infection from fungi (rare). What increases the risk? You are more likely to develop this condition if you:  Have a weak body defense system (immune system).  Do a lot of swimming or diving.  Overuse nasal sprays.  Smoke. What are the signs or symptoms? The main symptoms of this condition are pain and a feeling of pressure around the affected sinuses. Other symptoms include:  Stuffy nose or congestion.  Thick drainage from your nose.  Swelling and warmth over the affected sinuses.  Headache.  Upper toothache.  A cough that may get worse at night.  Extra mucus that collects in the throat or the back of the nose (postnasal drip).  Decreased sense of smell and taste.  Fatigue.  A fever.  Sore throat.  Bad breath. How is this diagnosed? This condition is diagnosed based on:  Your symptoms.  Your medical history.  A physical exam.  Tests to find out if your condition is  acute or chronic. This may include: ? Checking your nose for nasal polyps. ? Viewing your sinuses using a device that has a light (endoscope). ? Testing for allergies or bacteria. ? Imaging tests, such as an MRI or CT scan. In rare cases, a bone biopsy may be done to rule out more serious types of fungal sinus disease. How is this treated? Treatment for sinusitis depends on the cause and whether your condition is chronic or acute.  If caused by a virus, your symptoms should go away on their own within 10 days. You may be given medicines to relieve symptoms. They include: ? Medicines that shrink swollen nasal passages (topical intranasal decongestants). ? Medicines that treat allergies (antihistamines). ? A spray that eases inflammation of the nostrils (topical intranasal corticosteroids). ? Rinses that help get rid of thick mucus in your nose (nasal saline washes).  If caused by bacteria, your health care provider may recommend waiting to see if your symptoms improve. Most bacterial infections will get better without antibiotic medicine. You may be given antibiotics if you have: ? A severe infection. ? A weak immune system.  If caused by narrow nasal passages or nasal polyps, you may need to have surgery. Follow these instructions at home: Medicines  Take, use, or apply over-the-counter and prescription medicines only as told by your health care provider. These may include nasal sprays.  If you were prescribed an antibiotic medicine, take it as told by your health care provider. Do not stop taking the antibiotic even if you start   to feel better. Hydrate and humidify   Drink enough fluid to keep your urine pale yellow. Staying hydrated will help to thin your mucus.  Use a cool mist humidifier to keep the humidity level in your home above 50%.  Inhale steam for 10-15 minutes, 3-4 times a day, or as told by your health care provider. You can do this in the bathroom while a hot shower is  running.  Limit your exposure to cool or dry air. Rest  Rest as much as possible.  Sleep with your head raised (elevated).  Make sure you get enough sleep each night. General instructions   Apply a warm, moist washcloth to your face 3-4 times a day or as told by your health care provider. This will help with discomfort.  Wash your hands often with soap and water to reduce your exposure to germs. If soap and water are not available, use hand sanitizer.  Do not smoke. Avoid being around people who are smoking (secondhand smoke).  Keep all follow-up visits as told by your health care provider. This is important. Contact a health care provider if:  You have a fever.  Your symptoms get worse.  Your symptoms do not improve within 10 days. Get help right away if:  You have a severe headache.  You have persistent vomiting.  You have severe pain or swelling around your face or eyes.  You have vision problems.  You develop confusion.  Your neck is stiff.  You have trouble breathing. Summary  Sinusitis is soreness and inflammation of your sinuses. Sinuses are hollow spaces in the bones around your face.  This condition is caused by nasal tissues that become inflamed or swollen. The swelling traps or blocks the flow of mucus. This allows bacteria, viruses, and fungi to grow, which leads to infection.  If you were prescribed an antibiotic medicine, take it as told by your health care provider. Do not stop taking the antibiotic even if you start to feel better.  Keep all follow-up visits as told by your health care provider. This is important. This information is not intended to replace advice given to you by your health care provider. Make sure you discuss any questions you have with your health care provider. Document Revised: 03/10/2018 Document Reviewed: 03/10/2018 Elsevier Patient Education  2020 [employer]  

## 2020-06-16 LAB — LIPID PANEL
Cholesterol: 181 (ref 0–200)
HDL: 43 (ref 35–70)
LDL Cholesterol: 120
LDl/HDL Ratio: 4.2
Triglycerides: 101 (ref 40–160)

## 2020-06-16 LAB — COMPREHENSIVE METABOLIC PANEL
GFR calc Af Amer: 98
GFR calc non Af Amer: 85

## 2020-06-16 LAB — BASIC METABOLIC PANEL
Creatinine: 0.8 (ref <=1.1)
Glucose: 96

## 2020-07-03 IMAGING — MG MM DIGITAL SCREENING BILAT W/ TOMO W/ CAD
8 series · 8 of 24 positions shown · non-contrast
Comparison: Previous exam(s).

CLINICAL DATA: Screening.

EXAM:
DIGITAL SCREENING BILATERAL MAMMOGRAM WITH TOMO AND CAD

[L CC synth-2D]
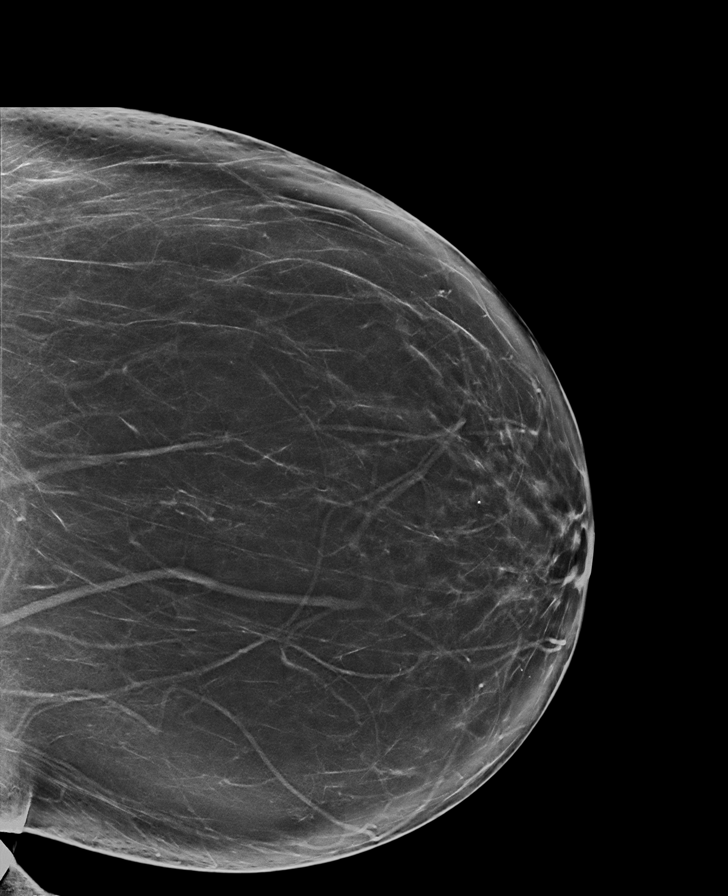

[R MLO synth-2D]
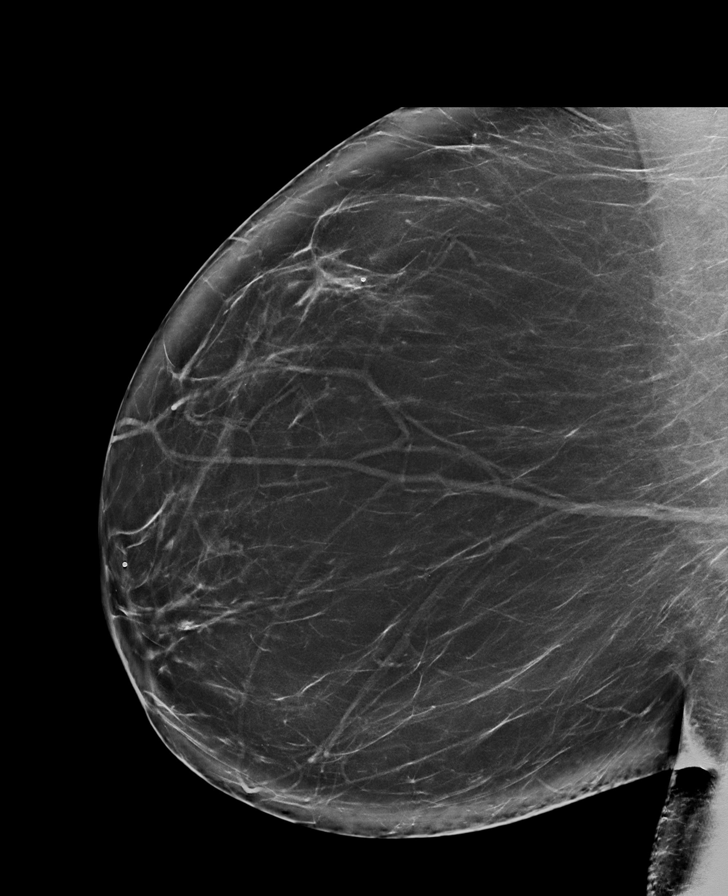

[L MLO synth-2D]
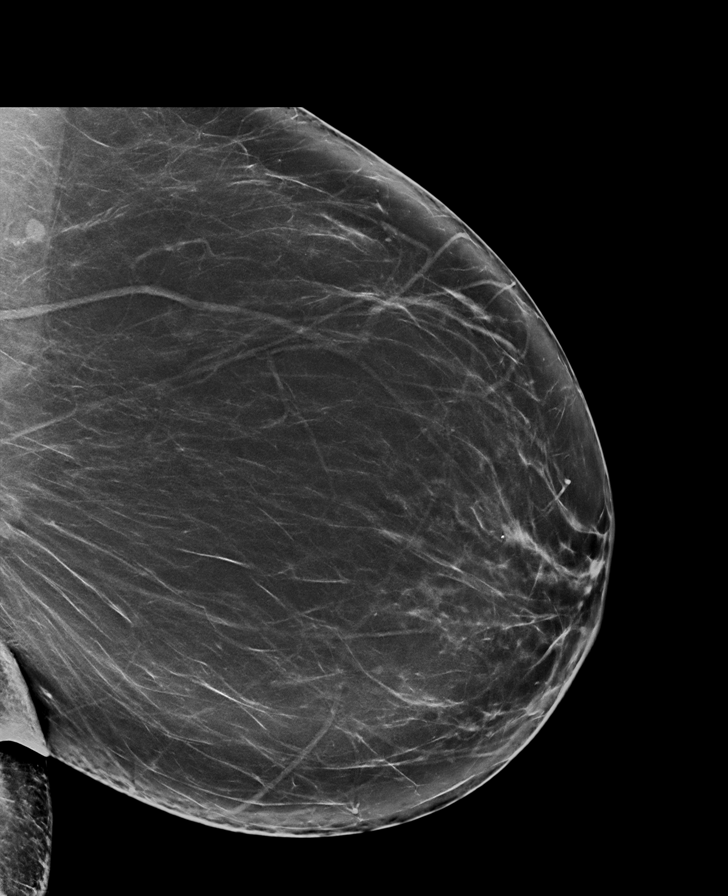

[R CC synth-2D]
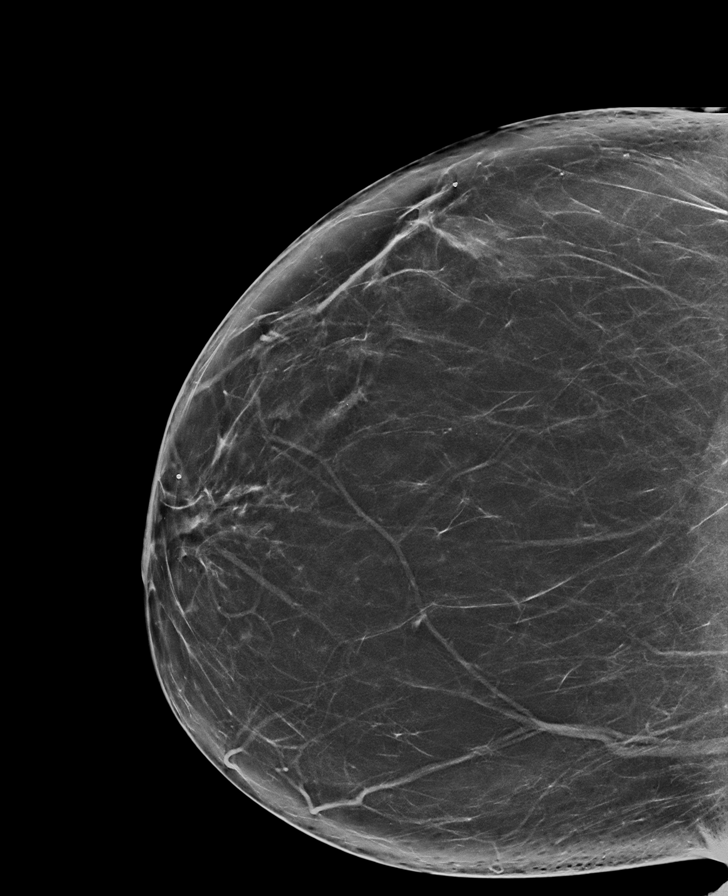

[L CC tomo · tomo slice 46/91.0]
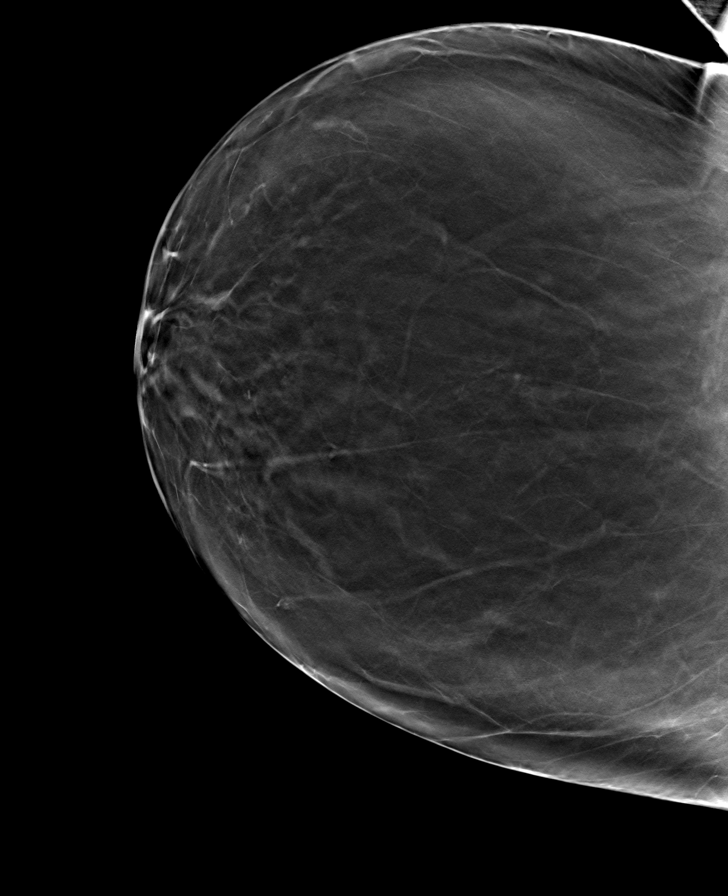

[L MLO tomo · tomo slice 45/90.0]
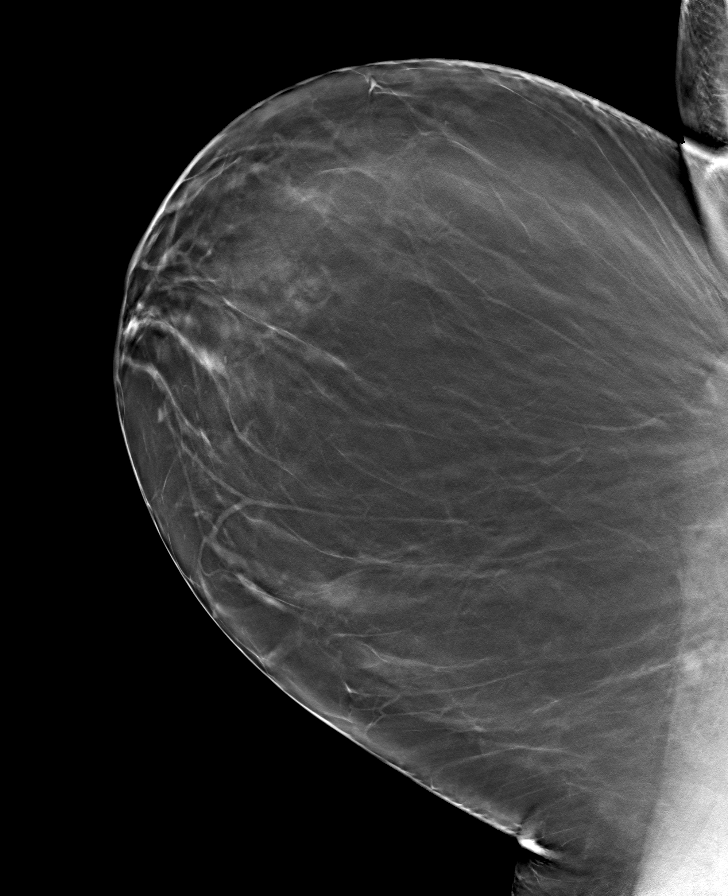

[R CC tomo · tomo slice 43/85.0]
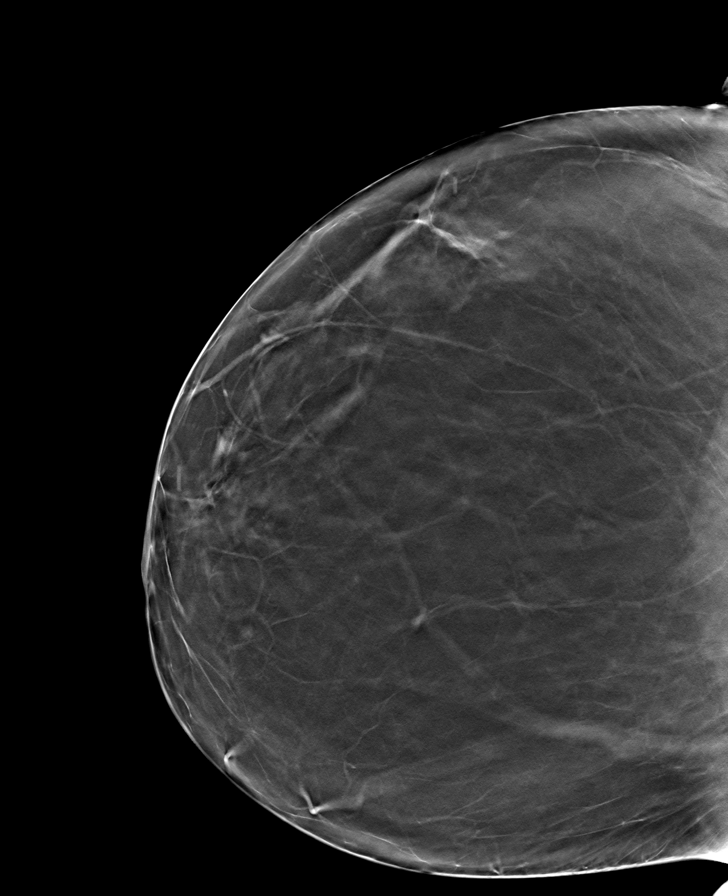

[R MLO tomo · tomo slice 46/91.0]
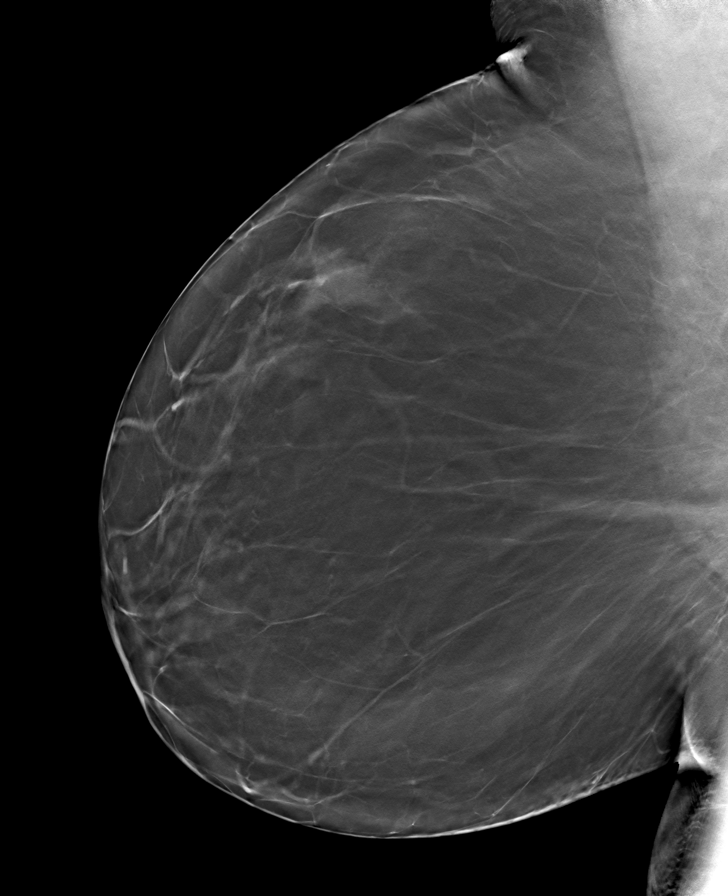

[8 of 24 positions shown; findings below may reference images not displayed]

ACR Breast Density Category b: There are scattered areas of
fibroglandular density.
FINDINGS: There are no findings suspicious for malignancy. Images were
processed with CAD.
IMPRESSION: No mammographic evidence of malignancy. A result letter of this
screening mammogram will be mailed directly to the patient.

RECOMMENDATION:
Screening mammogram in one year. (Code:[id number])

BI-RADS CATEGORY  1: Negative.

## 2020-07-08 ENCOUNTER — Other Ambulatory Visit: Payer: Self-pay

## 2020-07-08 ENCOUNTER — Ambulatory Visit (INDEPENDENT_AMBULATORY_CARE_PROVIDER_SITE_OTHER): Payer: Managed Care, Other (non HMO) | Admitting: Physician Assistant

## 2020-07-08 ENCOUNTER — Encounter: Payer: Self-pay | Admitting: Physician Assistant

## 2020-07-08 VITALS — BP 133/81 | HR 108 | Temp 98.8°F | Resp 16 | Wt 233.2 lb

## 2020-07-08 DIAGNOSIS — S46811A Strain of other muscles, fascia and tendons at shoulder and upper arm level, right arm, initial encounter: Secondary | ICD-10-CM

## 2020-07-08 MED ORDER — METHYLPREDNISOLONE 4 MG PO TBPK: ORAL_TABLET | ORAL | 0 refills | Status: DC

## 2020-07-08 NOTE — Progress Notes (Signed)
Established patient visit   Patient: Ashley Ballard   DOB: 30-Sep-1966   54 y.o. Female  MRN: [id number] Visit Date: 07/08/2020  Today's healthcare provider: Margaretann Loveless, PA-C   Chief Complaint  Patient presents with  . Back Pain   Subjective    Back Pain This is a chronic problem. 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 Episode frequency: comes and goes more at night when she lays down. The problem is unchanged. Pain location: right side by her shoulder. The quality of the pain is described as aching. Radiates to: mid back to her shouder. The pain is mild. The pain is worse during the night. The symptoms are aggravated by lying down, bending and sitting. Pertinent negatives include no numbness, tingling or weakness. Treatments tried: BioFreeze. The treatment provided no relief.    Patient Active Problem List   Diagnosis Date Noted  . Endometrial polyp 01/08/2018  . Postmenopausal bleeding 12/31/2017  . B12 deficiency 04/12/2017  . Essential hypertension 01/06/2016  . Chronic sinusitis 05/02/2015  . Abnormal ECG 04/07/2015  . 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 Acid reflux 04/07/2015  . Low back pain with sciatica 04/07/2015  . Adiposity 04/07/2015  . Hemorrhage, postmenopausal 04/07/2015  . Pain 04/07/2015  . Avitaminosis D 04/07/2015   Past Medical History:  Diagnosis Date  . Family history of adverse reaction to anesthesia    mother had problems  . GERD (gastroesophageal reflux disease)   . Hypertension        Medications: Outpatient Medications Prior to Visit  Medication Sig  . amLODipine (NORVASC) 5 MG tablet Take 1 tablet (5 mg total) by mouth daily.  . Ascorbic Acid (VITAMIN C) 1000 MG tablet Take 2,000 mg by mouth daily.  . cetirizine (ZYRTEC) 10 MG tablet TAKE 1 TABLET(10 MG) BY MOUTH DAILY  . Cholecalciferol (VITAMIN D-3) 5000 units TABS Take 5,000 Units by mouth  daily.  . fluticasone (FLONASE) 50 MCG/ACT nasal spray Place 2 sprays into both nostrils daily.  . furosemide (LASIX) 20 MG tablet TAKE 1 TABLET(20 MG) BY MOUTH DAILY AS NEEDED FOR FLUID RETENTION  . ibuprofen (ADVIL) 800 MG tablet Take 1 tablet (800 mg total) by mouth every 8 (eight) hours as needed. for pain  . loratadine (CLARITIN) 10 MG tablet Take 10 mg by mouth daily.  Marland Kitchen amoxicillin-clavulanate (AUGMENTIN) 875-125 MG tablet Take 1 tablet by mouth 2 (two) times daily.  . predniSONE (STERAPRED UNI-PAK 21 TAB) 10 MG (21) TBPK tablet 6 day taper; take as directed on package instructions   No facility-administered medications prior to visit.    Review of Systems  Constitutional: Negative.   Respiratory: Negative.   Cardiovascular: Negative.   Musculoskeletal: Positive for back pain and myalgias.  Neurological: Negative for tingling, weakness and numbness.    Last CBC Lab Results  Component Value Date   WBC 4.2 07/23/2019   HGB 12.4 07/23/2019   HCT 37.1 07/23/2019   MCV 94 07/23/2019   MCH 31.6 07/23/2019   RDW 12.1 07/23/2019   PLT 174 07/23/2019   Last metabolic panel Lab Results  Component Value Date   GLUCOSE 82 07/23/2019   NA 143 07/23/2019   K 4.1 07/23/2019   CL 104 07/23/2019   CO2 21 07/23/2019   BUN 14 07/23/2019   CREATININE 0.71 07/23/2019   GFRNONAA 96 07/23/2019   GFRAA 111 07/23/2019   CALCIUM  9.0 07/23/2019   PROT 6.8 07/23/2019   ALBUMIN 4.5 07/23/2019   LABGLOB 2.3 07/23/2019   AGRATIO 2.0 07/23/2019   BILITOT 0.9 07/23/2019   ALKPHOS 97 07/23/2019   AST 17 07/23/2019   ALT 20 07/23/2019   ANIONGAP 9 01/17/2016      Objective    BP 133/81 (BP Location: Left Arm, Patient Position: Sitting, Cuff Size: Large)   Pulse (!) 108   Temp 98.8 F (37.1 C) (Oral)   Resp 16   Wt 233 lb 3.2 oz (105.8 kg)   LMP 11/19/2015   BMI 40.03 kg/m  BP Readings from Last 3 Encounters:  07/08/20 133/81  04/08/20 136/83  07/23/19 124/85   Wt Readings  from Last 3 Encounters:  07/08/20 233 lb 3.2 oz (105.8 kg)  04/08/20 232 lb (105.2 kg)  07/23/19 222 lb (100.7 kg)      Physical Exam Vitals reviewed.  Constitutional:      General: She is not in acute distress.    Appearance: Normal appearance. She is well-developed. She is not ill-appearing.  HENT:     Head: Normocephalic and atraumatic.  Pulmonary:     Effort: Pulmonary effort is normal. No respiratory distress.  Musculoskeletal:     Cervical back: Normal, normal range of motion and neck supple.     Thoracic back: Normal.     Lumbar back: Normal.       Back:  Skin:    General: Skin is warm and dry.  Neurological:     Mental Status: She is alert.  Psychiatric:        Mood and Affect: Mood normal.        Behavior: Behavior normal.        Thought Content: Thought content normal.        Judgment: Judgment normal.       No results found for any visits on 07/08/20.  Assessment & Plan     1. Trapezius muscle strain, right, initial encounter Will give medrol as below. Continue moist heat and massage. Light stretches and exercises can be done. Call if worsening.  - methylPREDNISolone (MEDROL) 4 MG TBPK tablet; 6 day taper; take as directed on package instructions  Dispense: 21 tablet; Refill: 0   No follow-ups on file.      Delmer Islam, PA-C, have reviewed all documentation for this visit. The documentation on 07/09/20 for the exam, diagnosis, procedures, and orders are all accurate and complete.   Reine Just  [facility] [phone] (phone) [phone] (fax)  [facility]

## 2020-07-09 ENCOUNTER — Encounter: Payer: Self-pay | Admitting: Physician Assistant

## 2020-07-09 NOTE — Patient Instructions (Signed)
Shoulder Exercises Ask your health care provider which exercises are safe for you. Do exercises exactly as told by your health care provider and adjust them as directed. It is normal to feel mild stretching, pulling, tightness, or discomfort as you do these exercises. Stop right away if you feel sudden pain or your pain gets worse. Do not begin these exercises until told by your health care provider. Stretching exercises External rotation and abduction This exercise is sometimes called corner stretch. This exercise rotates your arm outward (external rotation) and moves your arm out from your body (abduction). 1. Stand in a doorway with one of your feet slightly in front of the other. This is called a staggered stance. If you cannot reach your forearms to the door frame, stand facing a corner of a room. 2. Choose one of the following positions as told by your health care provider: ? Place your hands and forearms on the door frame above your head. ? Place your hands and forearms on the door frame at the height of your head. ? Place your hands on the door frame at the height of your elbows. 3. Slowly move your weight onto your front foot until you feel a stretch across your chest and in the front of your shoulders. Keep your head and chest upright and keep your abdominal muscles tight. 4. Hold for __________ seconds. 5. To release the stretch, shift your weight to your back foot. Repeat __________ times. Complete this exercise __________ times a day. Extension, standing 1. Stand and hold a broomstick, a cane, or a similar object behind your back. ? Your hands should be a little wider than shoulder width apart. ? Your palms should face away from your back. 2. Keeping your elbows straight and your shoulder muscles relaxed, move the stick away from your body until you feel a stretch in your shoulders (extension). ? Avoid shrugging your shoulders while you move the stick. Keep your shoulder blades tucked  down toward the middle of your back. 3. Hold for __________ seconds. 4. Slowly return to the starting position. Repeat __________ times. Complete this exercise __________ times a day. Range-of-motion exercises Pendulum  1. Stand near a wall or a surface that you can hold onto for balance. 2. Bend at the waist and let your left / right arm hang straight down. Use your other arm to support you. Keep your back straight and do not lock your knees. 3. Relax your left / right arm and shoulder muscles, and move your hips and your trunk so your left / right arm swings freely. Your arm should swing because of the motion of your body, not because you are using your arm or shoulder muscles. 4. Keep moving your hips and trunk so your arm swings in the following directions, as told by your health care provider: ? Side to side. ? Forward and backward. ? In clockwise and counterclockwise circles. 5. Continue each motion for __________ seconds, or for as long as told by your health care provider. 6. Slowly return to the starting position. Repeat __________ times. Complete this exercise __________ times a day. Shoulder flexion, standing  1. Stand and hold a broomstick, a cane, or a similar object. Place your hands a little more than shoulder width apart on the object. Your left / right hand should be palm up, and your other hand should be palm down. 2. Keep your elbow straight and your shoulder muscles relaxed. Push the stick up with your healthy arm to   raise your left / right arm in front of your body, and then over your head until you feel a stretch in your shoulder (flexion). ? Avoid shrugging your shoulder while you raise your arm. Keep your shoulder blade tucked down toward the middle of your back. 3. Hold for __________ seconds. 4. Slowly return to the starting position. Repeat __________ times. Complete this exercise __________ times a day. Shoulder abduction, standing 1. Stand and hold a broomstick,  a cane, or a similar object. Place your hands a little more than shoulder width apart on the object. Your left / right hand should be palm up, and your other hand should be palm down. 2. Keep your elbow straight and your shoulder muscles relaxed. Push the object across your body toward your left / right side. Raise your left / right arm to the side of your body (abduction) until you feel a stretch in your shoulder. ? Do not raise your arm above shoulder height unless your health care provider tells you to do that. ? If directed, raise your arm over your head. ? Avoid shrugging your shoulder while you raise your arm. Keep your shoulder blade tucked down toward the middle of your back. 3. Hold for __________ seconds. 4. Slowly return to the starting position. Repeat __________ times. Complete this exercise __________ times a day. Internal rotation  1. Place your left / right hand behind your back, palm up. 2. Use your other hand to dangle an exercise band, a towel, or a similar object over your shoulder. Grasp the band with your left / right hand so you are holding on to both ends. 3. Gently pull up on the band until you feel a stretch in the front of your left / right shoulder. The movement of your arm toward the center of your body is called internal rotation. ? Avoid shrugging your shoulder while you raise your arm. Keep your shoulder blade tucked down toward the middle of your back. 4. Hold for __________ seconds. 5. Release the stretch by letting go of the band and lowering your hands. Repeat __________ times. Complete this exercise __________ times a day. Strengthening exercises External rotation  1. Sit in a stable chair without armrests. 2. Secure an exercise band to a stable object at elbow height on your left / right side. 3. Place a soft object, such as a folded towel or a small pillow, between your left / right upper arm and your body to move your elbow about 4 inches (10 cm) away  from your side. 4. Hold the end of the exercise band so it is tight and there is no slack. 5. Keeping your elbow pressed against the soft object, slowly move your forearm out, away from your abdomen (external rotation). Keep your body steady so only your forearm moves. 6. Hold for __________ seconds. 7. Slowly return to the starting position. Repeat __________ times. Complete this exercise __________ times a day. Shoulder abduction  1. Sit in a stable chair without armrests, or stand up. 2. Hold a __________ weight in your left / right hand, or hold an exercise band with both hands. 3. Start with your arms straight down and your left / right palm facing in, toward your body. 4. Slowly lift your left / right hand out to your side (abduction). Do not lift your hand above shoulder height unless your health care provider tells you that this is safe. ? Keep your arms straight. ? Avoid shrugging your shoulder while you   do this movement. Keep your shoulder blade tucked down toward the middle of your back. 5. Hold for __________ seconds. 6. Slowly lower your arm, and return to the starting position. Repeat __________ times. Complete this exercise __________ times a day. Shoulder extension 1. Sit in a stable chair without armrests, or stand up. 2. Secure an exercise band to a stable object in front of you so it is at shoulder height. 3. Hold one end of the exercise band in each hand. Your palms should face each other. 4. Straighten your elbows and lift your hands up to shoulder height. 5. Step back, away from the secured end of the exercise band, until the band is tight and there is no slack. 6. Squeeze your shoulder blades together as you pull your hands down to the sides of your thighs (extension). Stop when your hands are straight down by your sides. Do not let your hands go behind your body. 7. Hold for __________ seconds. 8. Slowly return to the starting position. Repeat __________ times.  Complete this exercise __________ times a day. Shoulder row 1. Sit in a stable chair without armrests, or stand up. 2. Secure an exercise band to a stable object in front of you so it is at waist height. 3. Hold one end of the exercise band in each hand. Position your palms so that your thumbs are facing the ceiling (neutral position). 4. Bend each of your elbows to a 90-degree angle (right angle) and keep your upper arms at your sides. 5. Step back until the band is tight and there is no slack. 6. Slowly pull your elbows back behind you. 7. Hold for __________ seconds. 8. Slowly return to the starting position. Repeat __________ times. Complete this exercise __________ times a day. Shoulder press-ups  1. Sit in a stable chair that has armrests. Sit upright, with your feet flat on the floor. 2. Put your hands on the armrests so your elbows are bent and your fingers are pointing forward. Your hands should be about even with the sides of your body. 3. Push down on the armrests and use your arms to lift yourself off the chair. Straighten your elbows and lift yourself up as much as you comfortably can. ? Move your shoulder blades down, and avoid letting your shoulders move up toward your ears. ? Keep your feet on the ground. As you get stronger, your feet should support less of your body weight as you lift yourself up. 4. Hold for __________ seconds. 5. Slowly lower yourself back into the chair. Repeat __________ times. Complete this exercise __________ times a day. Wall push-ups  1. Stand so you are facing a stable wall. Your feet should be about one arm-length away from the wall. 2. Lean forward and place your palms on the wall at shoulder height. 3. Keep your feet flat on the floor as you bend your elbows and lean forward toward the wall. 4. Hold for __________ seconds. 5. Straighten your elbows to push yourself back to the starting position. Repeat __________ times. Complete this exercise  __________ times a day. This information is not intended to replace advice given to you by your health care provider. Make sure you discuss any questions you have with your health care provider. Document Revised: 01/30/2019 Document Reviewed: 11/07/2018 Elsevier Patient Education  2020 [employer]  

## 2020-07-13 ENCOUNTER — Encounter: Payer: Self-pay | Admitting: Physician Assistant

## 2020-07-22 NOTE — Progress Notes (Signed)
Complete physical exam   Patient: Ashley Ballard   DOB: 05-17-1966   54 y.o. Female  MRN: [id number] Visit Date: 07/25/2020  Today's healthcare provider: Margaretann Loveless, PA-C   Chief Complaint  Patient presents with  . Annual Exam   Subjective    Ashley Ballard is a 54 y.o. female who presents today for a complete physical exam.  She reports consuming a regular but doing meal portion diet. Home exercise routine includes walking 30 minutes daily. She generally feels well. She reports sleeping well. She does not have additional problems to discuss today.  HPI  Patient had her biometric screening done in 08/21 PAP SMEAR-Modifier (Every 3 Years)  Next due on 12/31/2020   Mammogram:07/08/19-Normal mammogram. Repeat screening in one year. Colonoscopy:03/17/14   Past Medical History:  Diagnosis Date  . Family history of adverse reaction to anesthesia    mother had problems  . GERD (gastroesophageal reflux disease)   . Hypertension    Past Surgical History:  Procedure Laterality Date  . BIOPSY ENDOMETRIAL  01/21/2014  . BREAST CYST EXCISION Left 1983  . DILATATION & CURETTAGE/HYSTEROSCOPY WITH MYOSURE N/A 03/04/2018   Procedure: DILATATION & CURETTAGE/HYSTEROSCOPY;  Surgeon: Conard Novak, MD;  Location: [facility] ORS;  Service: Gynecology;  Laterality: N/A;  . TUBAL LIGATION     Social History   Socioeconomic History  . Marital status: Married    Spouse name: Casimiro Needle  . Number of children: 2  . Years of education: Not on file  . Highest education level: Not on file  Occupational History  . Occupation: full time  Tobacco Use  . Smoking status: Never Smoker  . Smokeless tobacco: Never Used  Vaping Use  . Vaping Use: Never used  Substance and Sexual Activity  . Alcohol use: No    Alcohol/week: 0.0 standard drinks  . Drug use: No  . Sexual activity: Yes  Other Topics Concern  . Not on file  Social History Narrative  . Not on file   Social Determinants of  Health   Financial Resource Strain:   . Difficulty of Paying Living Expenses: Not on file  Food Insecurity:   . Worried About Programme researcher, broadcasting/film/video in the Last Year: Not on file  . Ran Out of Food in the Last Year: Not on file  Transportation Needs:   . Lack of Transportation (Medical): Not on file  . Lack of Transportation (Non-Medical): Not on file  Physical Activity:   . Days of Exercise per Week: Not on file  . Minutes of Exercise per Session: Not on file  Stress:   . Feeling of Stress : Not on file  Social Connections:   . Frequency of Communication with Friends and Family: Not on file  . Frequency of Social Gatherings with Friends and Family: Not on file  . Attends Religious Services: Not on file  . Active Member of Clubs or Organizations: Not on file  . Attends Banker Meetings: Not on file  . Marital Status: Not on file  Intimate Partner Violence:   . Fear of Current or Ex-Partner: Not on file  . Emotionally Abused: Not on file  . Physically Abused: Not on file  . Sexually Abused: Not on file   Family Status  Relation Name Status  . Mother  Alive  . PGM  Deceased  . PGF  Deceased  . Sister 1 Alive  . Brother 1 Alive  . MGM  Deceased  .  Sister 1 Alive  . Oneal GroutPat Uncle  (Not Specified)  . MGF  (Not Specified)  . Sister 1 Alive   Family History  Problem Relation Age of Onset  . Hypertension Mother   . Diabetes Mother   . Breast cancer Mother [id number]  . Pneumonia Paternal Grandmother   . Leukemia Paternal Grandfather   . Breast cancer Maternal Grandmother [id number]  . Heart attack Paternal Uncle   . Kidney cancer Maternal Grandfather    Allergies  Allergen Reactions  . Lisinopril Itching    Mouth Itching    Patient Care Team: Reine JustBurnette, Avett Reineck M, PA-C as PCP - General (Physician Assistant)   Medications: Outpatient Medications Prior to Visit  Medication Sig  . amLODipine (NORVASC) 5 MG tablet Take 1 tablet (5 mg total) by mouth daily.  . Ascorbic Acid  (VITAMIN C) 1000 MG tablet Take 2,000 mg by mouth daily.  . cetirizine (ZYRTEC) 10 MG tablet TAKE 1 TABLET(10 MG) BY MOUTH DAILY  . Cholecalciferol (VITAMIN D-3) 5000 units TABS Take 5,000 Units by mouth daily.  . fluticasone (FLONASE) 50 MCG/ACT nasal spray Place 2 sprays into both nostrils daily.  . furosemide (LASIX) 20 MG tablet TAKE 1 TABLET(20 MG) BY MOUTH DAILY AS NEEDED FOR FLUID RETENTION  . ibuprofen (ADVIL) 800 MG tablet Take 1 tablet (800 mg total) by mouth every 8 (eight) hours as needed. for pain  . loratadine (CLARITIN) 10 MG tablet Take 10 mg by mouth daily.  . methylPREDNISolone (MEDROL) 4 MG TBPK tablet 6 day taper; take as directed on package instructions   No facility-administered medications prior to visit.    Review of Systems  Constitutional: Negative.   HENT: Negative.   Eyes: Negative.   Respiratory: Negative.   Cardiovascular: Negative.   Gastrointestinal: Negative.   Endocrine: Negative.   Genitourinary: Negative.   Musculoskeletal: Negative.   Skin: Negative.   Allergic/Immunologic: Negative.   Neurological: Negative.   Hematological: Negative.   Psychiatric/Behavioral: Negative.       Objective    BP 131/82 (BP Location: Left Arm, Patient Position: Sitting, Cuff Size: Large)   Pulse 96   Temp 97.9 F (36.6 C) (Oral)   Resp 16   Ht 5' 5.5" (1.664 m)   Wt 232 lb (105.2 kg)   LMP 11/19/2015   BMI 38.02 kg/m    Physical Exam Vitals reviewed.  Constitutional:      General: She is not in acute distress.    Appearance: Normal appearance. She is well-developed. She is obese. She is not ill-appearing or diaphoretic.  HENT:     Head: Normocephalic and atraumatic.     Right Ear: Tympanic membrane, ear canal and external ear normal.     Left Ear: Tympanic membrane, ear canal and external ear normal.     Nose: Nose normal.     Mouth/Throat:     Mouth: Mucous membranes are moist.     Pharynx: Oropharynx is clear. No oropharyngeal exudate or  posterior oropharyngeal erythema.  Eyes:     General: No scleral icterus.       Right eye: No discharge.        Left eye: No discharge.     Extraocular Movements: Extraocular movements intact.     Conjunctiva/sclera: Conjunctivae normal.     Pupils: Pupils are equal, round, and reactive to light.  Neck:     Thyroid: No thyromegaly.     Vascular: No carotid bruit or JVD.     Trachea: No tracheal deviation.  Cardiovascular:     Rate and Rhythm: Normal rate and regular rhythm.     Pulses: Normal pulses.     Heart sounds: Normal heart sounds. No murmur heard.  No friction rub. No gallop.   Pulmonary:     Effort: Pulmonary effort is normal. No respiratory distress.     Breath sounds: Normal breath sounds. No wheezing or rales.  Chest:     Chest wall: No tenderness.     Breasts:        Right: Normal.        Left: Normal.  Abdominal:     General: Abdomen is flat. Bowel sounds are normal. There is no distension.     Palpations: Abdomen is soft. There is no mass.     Tenderness: There is no abdominal tenderness. There is no guarding or rebound.  Musculoskeletal:        General: No tenderness. Normal range of motion.     Cervical back: Normal range of motion and neck supple. No tenderness.     Right lower leg: No edema.     Left lower leg: No edema.  Lymphadenopathy:     Cervical: No cervical adenopathy.  Skin:    General: Skin is warm and dry.     Capillary Refill: Capillary refill takes less than 2 seconds.     Findings: No rash.  Neurological:     General: No focal deficit present.     Mental Status: She is alert and oriented to person, place, and time. Mental status is at baseline.  Psychiatric:        Mood and Affect: Mood normal.        Behavior: Behavior normal.        Thought Content: Thought content normal.        Judgment: Judgment normal.      Last depression screening scores PHQ 2/9 Scores 07/25/2020 07/08/2020 07/23/2019  PHQ - 2 Score 0 0 0  PHQ- 9 Score - - 0     Last fall risk screening Fall Risk  07/08/2020  Falls in the past year? 0  Number falls in past yr: 0  Injury with Fall? 0  Risk for fall due to : No Fall Risks  Follow up Falls evaluation completed   Last Audit-C alcohol use screening Alcohol Use Disorder Test (AUDIT) 07/08/2020  1. How often do you have a drink containing alcohol? 0  2. How many drinks containing alcohol do you have on a typical day when you are drinking? 0  3. How often do you have six or more drinks on one occasion? 0  AUDIT-C Score 0  Alcohol Brief Interventions/Follow-up AUDIT Score <7 follow-up not indicated   A score of 3 or more in women, and 4 or more in men indicates increased risk for alcohol abuse, EXCEPT if all of the points are from question 1   No results found for any visits on 07/25/20.  Assessment & Plan    Routine Health Maintenance and Physical Exam  Exercise Activities and Dietary recommendations Goals   None     Immunization History  Administered Date(s) Administered  . Influenza,inj,Quad PF,6+ Mos 10/26/2019  . Td 12/26/2018  . Tdap 10/27/2008    Health Maintenance  Topic Date Due  . COVID-19 Vaccine (1) Never done  . INFLUENZA VACCINE  05/22/2020  . PAP SMEAR-Modifier  12/31/2020  . MAMMOGRAM  07/07/2021  . COLONOSCOPY  03/17/2024  . TETANUS/TDAP  12/25/2028  . Hepatitis C Screening  Completed  . HIV Screening  Completed    Discussed health benefits of physical activity, and encouraged her to engage in regular exercise appropriate for her age and condition.  1. Annual physical exam Normal physical exam today. Will check labs as below and f/u pending lab results. If labs are stable and WNL she will not need to have these rechecked for one year at her next annual physical exam. She is to call the office in the meantime if she has any acute issue, questions or concerns. - T4 AND TSH  2. Encounter for breast cancer screening using non-mammogram modality Breast exam today  was normal. There is no family history of breast cancer. She does perform regular self breast exams. Mammogram was ordered as below. Information for [facility] Breast clinic was given to patient so she may schedule her mammogram at her convenience. - MM 3D SCREEN BREAST BILATERAL; Future  3. Essential hypertension Stable. Continue Amlodipine 5mg .   4. Avitaminosis D H/O this and postmenopausal. Will check labs as below and f/u pending results. - Vitamin D (25 hydroxy)  5. B12 deficiency H/O this. Will check labs as below and f/u pending results. - B12 and Folate Panel  6. Class 2 severe obesity due to excess calories with serious comorbidity and body mass index (BMI) of 38.0 to 38.9 in adult [facility]) Counseled patient on healthy lifestyle modifications including dieting and exercise.  Patient has started walking during her lunch break and is limiting portion sizes.    No follow-ups on file.     [facility], PA-C, have reviewed all documentation for this visit. The documentation on 07/28/20 for the exam, diagnosis, procedures, and orders are all accurate and complete.   09/27/20  [facility] [phone] (phone) [phone] (fax)  [facility]

## 2020-07-25 ENCOUNTER — Other Ambulatory Visit: Payer: Self-pay

## 2020-07-25 ENCOUNTER — Ambulatory Visit (INDEPENDENT_AMBULATORY_CARE_PROVIDER_SITE_OTHER): Payer: Managed Care, Other (non HMO) | Admitting: Physician Assistant

## 2020-07-25 ENCOUNTER — Encounter: Payer: Self-pay | Admitting: Physician Assistant

## 2020-07-25 VITALS — BP 131/82 | HR 96 | Temp 97.9°F | Resp 16 | Ht 65.5 in | Wt 232.0 lb

## 2020-07-25 DIAGNOSIS — I10 Essential (primary) hypertension: Secondary | ICD-10-CM | POA: Diagnosis not present

## 2020-07-25 DIAGNOSIS — E538 Deficiency of other specified B group vitamins: Secondary | ICD-10-CM

## 2020-07-25 DIAGNOSIS — Z1239 Encounter for other screening for malignant neoplasm of breast: Secondary | ICD-10-CM

## 2020-07-25 DIAGNOSIS — Z6838 Body mass index (BMI) 38.0-38.9, adult: Secondary | ICD-10-CM

## 2020-07-25 DIAGNOSIS — E559 Vitamin D deficiency, unspecified: Secondary | ICD-10-CM | POA: Diagnosis not present

## 2020-07-25 DIAGNOSIS — Z0000 Encounter for general adult medical examination without abnormal findings: Secondary | ICD-10-CM | POA: Diagnosis not present

## 2020-07-25 NOTE — Patient Instructions (Signed)
[facility] °[address] °[place],    [address] °Main: [phone] ° ° °Health Maintenance for Postmenopausal Women °Menopause is a normal process in which your ability to get pregnant comes to an end. This process happens slowly over many months or years, usually between the ages of 48 and 55. Menopause is complete when you have missed your menstrual periods for 12 months. °It is important to talk with your health care provider about some of the most common conditions that affect women after menopause (postmenopausal women). These include heart disease, cancer, and bone loss (osteoporosis). Adopting a healthy lifestyle and getting preventive care can help to promote your health and wellness. The actions you take can also lower your chances of developing some of these common conditions. °What should I know about menopause? °During menopause, you may get a number of symptoms, such as: °· Hot flashes. These can be moderate or severe. °· Night sweats. °· Decrease in sex drive. °· Mood swings. °· Headaches. °· Tiredness. °· Irritability. °· Memory problems. °· Insomnia. °Choosing to treat or not to treat these symptoms is a decision that you make with your health care provider. °Do I need hormone replacement therapy? °· Hormone replacement therapy is effective in treating symptoms that are caused by menopause, such as hot flashes and night sweats. °· Hormone replacement carries certain risks, especially as you become older. If you are thinking about using estrogen or estrogen with progestin, discuss the benefits and risks with your health care provider. °What is my risk for heart disease and stroke? °The risk of heart disease, heart attack, and stroke increases as you age. One of the causes may be a change in the body's hormones during menopause. This can affect how your body uses dietary fats, triglycerides, and cholesterol. Heart attack and stroke are medical  emergencies. There are many things that you can do to help prevent heart disease and stroke. °Watch your blood pressure °· High blood pressure causes heart disease and increases the risk of stroke. This is more likely to develop in people who have high blood pressure readings, are of African descent, or are overweight. °· Have your blood pressure checked: °? Every 3-5 years if you are 18-39 years of age. °? Every year if you are 40 years old or older. °Eat a healthy diet ° °· Eat a diet that includes plenty of vegetables, fruits, low-fat dairy products, and lean protein. °· Do not eat a lot of foods that are high in solid fats, added sugars, or sodium. °Get regular exercise °Get regular exercise. This is one of the most important things you can do for your health. Most adults should: °· Try to exercise for at least 150 minutes each week. The exercise should increase your heart rate and make you sweat (moderate-intensity exercise). °· Try to do strengthening exercises at least twice each week. Do these in addition to the moderate-intensity exercise. °· Spend less time sitting. Even light physical activity can be beneficial. °Other tips °· Work with your health care provider to achieve or maintain a healthy weight. °· Do not use any products that contain nicotine or tobacco, such as cigarettes, e-cigarettes, and chewing tobacco. If you need help quitting, ask your health care provider. °· Know your numbers. Ask your health care provider to check your cholesterol and your blood sugar (glucose). Continue to have your blood tested as directed by your health care provider. °Do I need screening for cancer? °Depending on   your health history and family history, you may need to have cancer screening at different stages of your life. This may include screening for: °· Breast cancer. °· Cervical cancer. °· Lung cancer. °· Colorectal cancer. °What is my risk for osteoporosis? °After menopause, you may be at increased risk for  osteoporosis. Osteoporosis is a condition in which bone destruction happens more quickly than new bone creation. To help prevent osteoporosis or the bone fractures that can happen because of osteoporosis, you may take the following actions: °· If you are 19-50 years old, get at least 1,000 mg of calcium and at least 600 mg of vitamin D per day. °· If you are older than age 50 but younger than age 70, get at least 1,200 mg of calcium and at least 600 mg of vitamin D per day. °· If you are older than age 70, get at least 1,200 mg of calcium and at least 800 mg of vitamin D per day. °Smoking and drinking excessive alcohol increase the risk of osteoporosis. Eat foods that are rich in calcium and vitamin D, and do weight-bearing exercises several times each week as directed by your health care provider. °How does menopause affect my mental health? °Depression may occur at any age, but it is more common as you become older. Common symptoms of depression include: °· Low or sad mood. °· Changes in sleep patterns. °· Changes in appetite or eating patterns. °· Feeling an overall lack of motivation or enjoyment of activities that you previously enjoyed. °· Frequent crying spells. °Talk with your health care provider if you think that you are experiencing depression. °General instructions °See your health care provider for regular wellness exams and vaccines. This may include: °· Scheduling regular health, dental, and eye exams. °· Getting and maintaining your vaccines. These include: °? Influenza vaccine. Get this vaccine each year before the flu season begins. °? Pneumonia vaccine. °? Shingles vaccine. °? Tetanus, diphtheria, and pertussis (Tdap) booster vaccine. °Your health care provider may also recommend other immunizations. °Tell your health care provider if you have ever been abused or do not feel safe at home. °Summary °· Menopause is a normal process in which your ability to get pregnant comes to an end. °· This  condition causes hot flashes, night sweats, decreased interest in sex, mood swings, headaches, or lack of sleep. °· Treatment for this condition may include hormone replacement therapy. °· Take actions to keep yourself healthy, including exercising regularly, eating a healthy diet, watching your weight, and checking your blood pressure and blood sugar levels. °· Get screened for cancer and depression. Make sure that you are up to date with all your vaccines. °This information is not intended to replace advice given to you by your health care provider. Make sure you discuss any questions you have with your health care provider. °Document Revised: 10/01/2018 Document Reviewed: 10/01/2018 °Elsevier Patient Education © 2020 Elsevier Inc. ° °

## 2020-07-26 ENCOUNTER — Telehealth: Payer: Self-pay

## 2020-07-26 LAB — B12 AND FOLATE PANEL
Folate: 10.8 ng/mL (ref >3.0)
Vitamin B-12: 510 pg/mL (ref 232–1245)

## 2020-07-26 LAB — T4 AND TSH
T4, Total: 9.3 ug/dL (ref 4.5–12.0)
TSH: 1.69 u[IU]/mL (ref 0.450–4.500)

## 2020-07-26 LAB — VITAMIN D 25 HYDROXY (VIT D DEFICIENCY, FRACTURES): Vit D, 25-Hydroxy: 75.5 ng/mL (ref 30.0–100.0)

## 2020-07-26 NOTE — Telephone Encounter (Signed)
-----   Message from Margaretann Loveless, PA-C sent at 07/26/2020  8:41 AM EDT ----- All labs look great!

## 2020-07-26 NOTE — Telephone Encounter (Signed)
Patient advised as directed below. 

## 2020-07-28 ENCOUNTER — Encounter: Payer: Self-pay | Admitting: Physician Assistant

## 2020-08-25 ENCOUNTER — Encounter: Payer: Self-pay | Admitting: Physician Assistant

## 2020-10-18 ENCOUNTER — Other Ambulatory Visit: Payer: Self-pay

## 2020-10-18 ENCOUNTER — Ambulatory Visit
Admission: RE | Admit: 2020-10-18 | Discharge: 2020-10-18 | Disposition: A | Discharge status: Home / Self Care | Payer: Managed Care, Other (non HMO) | Source: Ambulatory Visit | Attending: Physician Assistant | Admitting: Physician Assistant

## 2020-10-18 DIAGNOSIS — Z1239 Encounter for other screening for malignant neoplasm of breast: Secondary | ICD-10-CM

## 2020-10-18 DIAGNOSIS — Z1231 Encounter for screening mammogram for malignant neoplasm of breast: Secondary | ICD-10-CM | POA: Insufficient documentation

## 2020-10-29 ENCOUNTER — Encounter: Payer: Self-pay | Admitting: Physician Assistant

## 2020-10-29 DIAGNOSIS — J301 Allergic rhinitis due to pollen: Secondary | ICD-10-CM

## 2020-10-31 MED ORDER — CETIRIZINE HCL 10 MG PO TABS: ORAL_TABLET | ORAL | 1 refills | Status: DC

## 2021-01-13 ENCOUNTER — Encounter: Payer: Self-pay | Admitting: Physician Assistant

## 2021-01-13 ENCOUNTER — Other Ambulatory Visit: Payer: Self-pay

## 2021-01-13 ENCOUNTER — Ambulatory Visit: Payer: Managed Care, Other (non HMO) | Admitting: Physician Assistant

## 2021-01-13 VITALS — BP 130/81 | HR 88 | Temp 97.8°F | Wt 238.5 lb

## 2021-01-13 DIAGNOSIS — M5441 Lumbago with sciatica, right side: Secondary | ICD-10-CM | POA: Diagnosis not present

## 2021-01-13 DIAGNOSIS — G8929 Other chronic pain: Secondary | ICD-10-CM

## 2021-01-13 DIAGNOSIS — M5442 Lumbago with sciatica, left side: Secondary | ICD-10-CM

## 2021-01-13 DIAGNOSIS — I10 Essential (primary) hypertension: Secondary | ICD-10-CM

## 2021-01-13 DIAGNOSIS — Z6838 Body mass index (BMI) 38.0-38.9, adult: Secondary | ICD-10-CM | POA: Diagnosis not present

## 2021-01-13 NOTE — Patient Instructions (Signed)
Calorie Counting for Weight Loss Calories are units of energy. Your body needs a certain number of calories from food to keep going throughout the day. When you eat or drink more calories than your body needs, your body stores the extra calories mostly as fat. When you eat or drink fewer calories than your body needs, your body burns fat to get the energy it needs. Calorie counting means keeping track of how many calories you eat and drink each day. Calorie counting can be helpful if you need to lose weight. If you eat fewer calories than your body needs, you should lose weight. Ask your health care provider what a healthy weight is for you. For calorie counting to work, you will need to eat the right number of calories each day to lose a healthy amount of weight per week. A dietitian can help you figure out how many calories you need in a day and will suggest ways to reach your calorie goal.  A healthy amount of weight to lose each week is usually 1-2 lb (0.5-0.9 kg). This usually means that your daily calorie intake should be reduced by 500-750 calories.  Eating 1,200-1,500 calories a day can help most women lose weight.  Eating 1,500-1,800 calories a day can help most men lose weight. What do I need to know about calorie counting? Work with your health care provider or dietitian to determine how many calories you should get each day. To meet your daily calorie goal, you will need to:  Find out how many calories are in each food that you would like to eat. Try to do this before you eat.  Decide how much of the food you plan to eat.  Keep a food log. Do this by writing down what you ate and how many calories it had. To successfully lose weight, it is important to balance calorie counting with a healthy lifestyle that includes regular activity. Where do I find calorie information? The number of calories in a food can be found on a Nutrition Facts label. If a food does not have a Nutrition Facts  label, try to look up the calories online or ask your dietitian for help. Remember that calories are listed per serving. If you choose to have more than one serving of a food, you will have to multiply the calories per serving by the number of servings you plan to eat. For example, the label on a package of bread might say that a serving size is 1 slice and that there are 90 calories in a serving. If you eat 1 slice, you will have eaten 90 calories. If you eat 2 slices, you will have eaten 180 calories.   How do I keep a food log? After each time that you eat, record the following in your food log as soon as possible:  What you ate. Be sure to include toppings, sauces, and other extras on the food.  How much you ate. This can be measured in cups, ounces, or number of items.  How many calories were in each food and drink.  The total number of calories in the food you ate. Keep your food log near you, such as in a pocket-sized notebook or on an app or website on your mobile phone. Some programs will calculate calories for you and show you how many calories you have left to meet your daily goal. What are some portion-control tips?  Know how many calories are in a serving. This will   help you know how many servings you can have of a certain food.  Use a measuring cup to measure serving sizes. You could also try weighing out portions on a kitchen scale. With time, you will be able to estimate serving sizes for some foods.  Take time to put servings of different foods on your favorite plates or in your favorite bowls and cups so you know what a serving looks like.  Try not to eat straight from a food's packaging, such as from a bag or box. Eating straight from the package makes it hard to see how much you are eating and can lead to overeating. Put the amount you would like to eat in a cup or on a plate to make sure you are eating the right portion.  Use smaller plates, glasses, and bowls for smaller  portions and to prevent overeating.  Try not to multitask. For example, avoid watching TV or using your computer while eating. If it is time to eat, sit down at a table and enjoy your food. This will help you recognize when you are full. It will also help you be more mindful of what and how much you are eating. What are tips for following this plan? Reading food labels  Check the calorie count compared with the serving size. The serving size may be smaller than what you are used to eating.  Check the source of the calories. Try to choose foods that are high in protein, fiber, and vitamins, and low in saturated fat, trans fat, and sodium. Shopping  Read nutrition labels while you shop. This will help you make healthy decisions about which foods to buy.  Pay attention to nutrition labels for low-fat or fat-free foods. These foods sometimes have the same number of calories or more calories than the full-fat versions. They also often have added sugar, starch, or salt to make up for flavor that was removed with the fat.  Make a grocery list of lower-calorie foods and stick to it. Cooking  Try to cook your favorite foods in a healthier way. For example, try baking instead of frying.  Use low-fat dairy products. Meal planning  Use more fruits and vegetables. One-half of your plate should be fruits and vegetables.  Include lean proteins, such as chicken, turkey, and fish. Lifestyle Each week, aim to do one of the following:  150 minutes of moderate exercise, such as walking.  75 minutes of vigorous exercise, such as running. General information  Know how many calories are in the foods you eat most often. This will help you calculate calorie counts faster.  Find a way of tracking calories that works for you. Get creative. Try different apps or programs if writing down calories does not work for you. What foods should I eat?  Eat nutritious foods. It is better to have a nutritious,  high-calorie food, such as an avocado, than a food with few nutrients, such as a bag of potato chips.  Use your calories on foods and drinks that will fill you up and will not leave you hungry soon after eating. ? Examples of foods that fill you up are nuts and nut butters, vegetables, lean proteins, and high-fiber foods such as whole grains. High-fiber foods are foods with more than 5 g of fiber per serving.  Pay attention to calories in drinks. Low-calorie drinks include water and unsweetened drinks. The items listed above may not be a complete list of foods and beverages you can eat.   Contact a dietitian for more information.   What foods should I limit? Limit foods or drinks that are not good sources of vitamins, minerals, or protein or that are high in unhealthy fats. These include:  Candy.  Other sweets.  Sodas, specialty coffee drinks, alcohol, and juice. The items listed above may not be a complete list of foods and beverages you should avoid. Contact a dietitian for more information. How do I count calories when eating out?  Pay attention to portions. Often, portions are much larger when eating out. Try these tips to keep portions smaller: ? Consider sharing a meal instead of getting your own. ? If you get your own meal, eat only half of it. Before you start eating, ask for a container and put half of your meal into it. ? When available, consider ordering smaller portions from the menu instead of full portions.  Pay attention to your food and drink choices. Knowing the way food is cooked and what is included with the meal can help you eat fewer calories. ? If calories are listed on the menu, choose the lower-calorie options. ? Choose dishes that include vegetables, fruits, whole grains, low-fat dairy products, and lean proteins. ? Choose items that are boiled, broiled, grilled, or steamed. Avoid items that are buttered, battered, fried, or served with cream sauce. Items labeled as  crispy are usually fried, unless stated otherwise. ? Choose water, low-fat milk, unsweetened iced tea, or other drinks without added sugar. If you want an alcoholic beverage, choose a lower-calorie option, such as a glass of wine or light beer. ? Ask for dressings, sauces, and syrups on the side. These are usually high in calories, so you should limit the amount you eat. ? If you want a salad, choose a garden salad and ask for grilled meats. Avoid extra toppings such as bacon, cheese, or fried items. Ask for the dressing on the side, or ask for olive oil and vinegar or lemon to use as dressing.  Estimate how many servings of a food you are given. Knowing serving sizes will help you be aware of how much food you are eating at restaurants. Where to find more information  Centers for Disease Control and Prevention: www.cdc.gov  [place] Department of Agriculture: myplate.gov Summary  Calorie counting means keeping track of how many calories you eat and drink each day. If you eat fewer calories than your body needs, you should lose weight.  A healthy amount of weight to lose per week is usually 1-2 lb (0.5-0.9 kg). This usually means reducing your daily calorie intake by 500-750 calories.  The number of calories in a food can be found on a Nutrition Facts label. If a food does not have a Nutrition Facts label, try to look up the calories online or ask your dietitian for help.  Use smaller plates, glasses, and bowls for smaller portions and to prevent overeating.  Use your calories on foods and drinks that will fill you up and not leave you hungry shortly after a meal. This information is not intended to replace advice given to you by your health care provider. Make sure you discuss any questions you have with your health care provider. Document Revised: 11/19/2019 Document Reviewed: 11/19/2019 Elsevier Patient Education  2021 Elsevier Inc.  

## 2021-01-13 NOTE — Progress Notes (Signed)
Established patient visit   Patient: Ashley Ballard   DOB: 1965/12/18   55 y.o. Female  MRN: [id number] Visit Date: 01/13/2021  Today's healthcare provider: Margaretann Loveless, PA-C   Chief Complaint  Patient presents with  . Hypertension  I,Porsha C McClurkin,acting as a scribe for [employer], PA-C.,have documented all relevant documentation on the behalf of Margaretann Loveless, PA-C,as directed by  Margaretann Loveless, PA-C while in the presence of Margaretann Loveless, PA-C.  Subjective    HPI  Hypertension, follow-up  BP Readings from Last 3 Encounters:  01/13/21 130/81  07/25/20 131/82  07/08/20 133/81   Wt Readings from Last 3 Encounters:  01/13/21 238 lb 8 oz (108.2 kg)  07/25/20 232 lb (105.2 kg)  07/08/20 233 lb 3.2 oz (105.8 kg)     She was last seen for hypertension 5 months ago.  BP at that visit was 131/82. Management since that visit includes continue current medication.  She reports good compliance with treatment. She is not having side effects.  She is following a Low Sodium diet. She is exercising. She does not smoke.  Use of agents associated with hypertension: none.   Outside blood pressures are 130's-140's/ 70-90's. Symptoms: No chest pain No chest pressure  No palpitations No syncope  No dyspnea No orthopnea  No paroxysmal nocturnal dyspnea No lower extremity edema   Pertinent labs: Lab Results  Component Value Date   CHOL 181 06/16/2020   HDL 43 06/16/2020   LDLCALC 120 06/16/2020   TRIG 101 06/16/2020   CHOLHDL 3.3 07/23/2019   Lab Results  Component Value Date   NA 143 07/23/2019   K 4.1 07/23/2019   CREATININE 0.8 06/16/2020   GFRNONAA 85 mL/min 06/16/2020   GFRAA 98 mL/min/1.73 06/16/2020   GLUCOSE 82 07/23/2019     The 10-year ASCVD risk score Denman George DC Jr., et al., 2013) is: 6.6%* (Cholesterol units were assumed)    ---------------------------------------------------------------------------------------------------   Patient Active Problem List   Diagnosis Date Noted  . Endometrial polyp 01/08/2018  . Postmenopausal bleeding 12/31/2017  . B12 deficiency 04/12/2017  . Essential hypertension 01/06/2016  . Chronic sinusitis 05/02/2015  . Abnormal ECG 04/07/2015  . 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 Acid reflux 04/07/2015  . Low back pain with sciatica 04/07/2015  . Adiposity 04/07/2015  . Hemorrhage, postmenopausal 04/07/2015  . Pain 04/07/2015  . Avitaminosis D 04/07/2015   Past Medical History:  Diagnosis Date  . Family history of adverse reaction to anesthesia    mother had problems  . GERD (gastroesophageal reflux disease)   . Hypertension        Medications: Outpatient Medications Prior to Visit  Medication Sig  . amLODipine (NORVASC) 5 MG tablet Take 1 tablet (5 mg total) by mouth daily.  . Ascorbic Acid (VITAMIN C) 1000 MG tablet Take 2,000 mg by mouth daily.  . cetirizine (ZYRTEC) 10 MG tablet TAKE 1 TABLET(10 MG) BY MOUTH DAILY  . Cholecalciferol (VITAMIN D-3) 5000 units TABS Take 5,000 Units by mouth daily.  . fluticasone (FLONASE) 50 MCG/ACT nasal spray Place 2 sprays into both nostrils daily.  . furosemide (LASIX) 20 MG tablet TAKE 1 TABLET(20 MG) BY MOUTH DAILY AS NEEDED FOR FLUID RETENTION  . ibuprofen (ADVIL) 800 MG tablet Take 1 tablet (800 mg total) by mouth every 8 (eight) hours as needed. for pain  . loratadine (CLARITIN) 10 MG tablet Take 10 mg by mouth daily.   No facility-administered medications  prior to visit.    Review of Systems  Constitutional: Negative.   Respiratory: Negative.   Endocrine: Negative.   Neurological: Negative.     Last CBC Lab Results  Component Value Date   WBC 4.2 07/23/2019   HGB 12.4 07/23/2019   HCT 37.1 07/23/2019   MCV 94 07/23/2019   MCH 31.6 07/23/2019   RDW 12.1 07/23/2019   PLT 174 07/23/2019   Last metabolic  panel Lab Results  Component Value Date   GLUCOSE 82 07/23/2019   NA 143 07/23/2019   K 4.1 07/23/2019   CL 104 07/23/2019   CO2 21 07/23/2019   BUN 14 07/23/2019   CREATININE 0.8 06/16/2020   GFRNONAA 85 mL/min 06/16/2020   GFRAA 98 mL/min/1.73 06/16/2020   CALCIUM 9.0 07/23/2019   PROT 6.8 07/23/2019   ALBUMIN 4.5 07/23/2019   LABGLOB 2.3 07/23/2019   AGRATIO 2.0 07/23/2019   BILITOT 0.9 07/23/2019   ALKPHOS 97 07/23/2019   AST 17 07/23/2019   ALT 20 07/23/2019   ANIONGAP 9 01/17/2016       Objective    BP 130/81 (BP Location: Left Arm, Patient Position: Sitting, Cuff Size: Normal)   Pulse 88   Temp 97.8 F (36.6 C) (Oral)   Wt 238 lb 8 oz (108.2 kg)   LMP 11/19/2015   SpO2 100%   BMI 39.08 kg/m  BP Readings from Last 3 Encounters:  01/13/21 130/81  07/25/20 131/82  07/08/20 133/81   Wt Readings from Last 3 Encounters:  01/13/21 238 lb 8 oz (108.2 kg)  07/25/20 232 lb (105.2 kg)  07/08/20 233 lb 3.2 oz (105.8 kg)       Physical Exam Vitals reviewed.  Constitutional:      General: She is not in acute distress.    Appearance: Normal appearance. She is well-developed. She is not ill-appearing or diaphoretic.  Cardiovascular:     Rate and Rhythm: Normal rate and regular rhythm.     Heart sounds: Normal heart sounds. No murmur heard. No friction rub. No gallop.   Pulmonary:     Effort: Pulmonary effort is normal. No respiratory distress.     Breath sounds: Normal breath sounds. No wheezing or rales.  Musculoskeletal:     Cervical back: Normal range of motion and neck supple.  Neurological:     Mental Status: She is alert.       No results found for any visits on 01/13/21.  Assessment & Plan     1. Class 2 severe obesity due to excess calories with serious comorbidity and body mass index (BMI) of 38.0 to 38.9 in adult [facility]) Counseled patient on healthy lifestyle modifications including dieting and exercise. Referral placed to MNT.  - Amb ref to  Medical Nutrition Therapy-MNT  2. Primary hypertension Stable. Continue current treatment.  - Amb ref to Medical Nutrition Therapy-MNT  3. Chronic midline low back pain with bilateral sciatica Stable. Continue back exercises. Discussed adding yoga.    No follow-ups on file.      Delmer Islam, PA-C, have reviewed all documentation for this visit. The documentation on 01/29/21 for the exam, diagnosis, procedures, and orders are all accurate and complete.   Reine Just  [place] [phone] (phone) [phone] (fax)  [facility]

## 2021-01-18 ENCOUNTER — Encounter: Payer: Self-pay | Admitting: Physician Assistant

## 2021-03-06 ENCOUNTER — Other Ambulatory Visit: Payer: Self-pay

## 2021-03-06 ENCOUNTER — Encounter: Payer: Self-pay | Admitting: Dietician

## 2021-03-06 ENCOUNTER — Encounter: Payer: Managed Care, Other (non HMO) | Attending: Physician Assistant | Admitting: Dietician

## 2021-03-06 VITALS — Ht 66.0 in | Wt 240.0 lb

## 2021-03-06 DIAGNOSIS — I10 Essential (primary) hypertension: Secondary | ICD-10-CM | POA: Insufficient documentation

## 2021-03-06 DIAGNOSIS — Z6838 Body mass index (BMI) 38.0-38.9, adult: Secondary | ICD-10-CM | POA: Insufficient documentation

## 2021-03-06 DIAGNOSIS — E669 Obesity, unspecified: Secondary | ICD-10-CM

## 2021-03-06 NOTE — Patient Instructions (Addendum)
   Continue to streamline meal prepping; use menus for meal ideas.   Keep up the regular walking, great job! Consider adding some light weights or strength building exercises.  Control portions of starchy foods and keep meat portions to palm size or less.   Continue to limit salty seasonings, sauces, and gravies.

## 2021-03-06 NOTE — Progress Notes (Signed)
Medical Nutrition Therapy: Visit start time: 1600  end time: 1710  Assessment:  Diagnosis: hypertension, obesity Past medical history: none significant Psychosocial issues/ stress concerns: none  Preferred learning method:  . Visual   Current weight: 240lbs Height: 5'6" Medications, supplements: reconciled list in medical record  Progress and evaluation:   Patient reports weight gain when going through menopause.   Was walking during breaks and lunch during workday and maintaining weight at 215-217lbs.    Since pandemic she has been working from home and activity has decreased.   Has tried weight watchers in the past, lost weight, but took a lot of time.   Reports BP is usually 130s/80-82  Avoids using salt at table; in cooking uses Mrs. Dash of [employer]  She is preparing breakfast meals ahead of time for herself and her family, and wants to implement some pre-prepping for lunch and/or dinner meals also.  Physical activity: walking 30 minutes 4 times a week  Dietary Intake:  Usual eating pattern includes 2-3 meals and 0-1 snacks per day. Dining out frequency: 1-2 meals per week.  Breakfast: 9am-- 2 boiled eggs + 2pcs [place] sausage Snack: none Lunch: sometimes skips, sometimes can tuna and crackers Snack: occasionally light and fit yogurt with small amount granola Supper: 7pm-- usually cooks -- ground beef/ chicken + 1-2 veg Snack: sometimes ice cream (tries not to buy sweets or chips) Beverages: water, hot tea with splenda and lemon occ honey; occasionally sprite zero  Nutrition Care Education: Topics covered:  Basic nutrition: basic food groups; appropriate nutrient balance; appropriate meal and snack schedule; general nutrition guidelines; meal prep strategies  Weight control: importance of low sugar and low fat choices; portion control; estimated energy needs for weight loss at 1400kcal, provided guidance for 45% CHO, 25% pro, 30% fat; discussed role of physical  activity, options for strength building Hypertension: goal for sodium intake; identifying high sodium foods; identifying food sources of potassium, magnesium and role in controlling BP; exercise and weight control   Nutritional Diagnosis:  [place]-2.1 Inpaired nutrition utilization As related to hypertension.  As evidenced by patient reported elevated BP controlled with medication. [employer]-3.3 Overweight/obesity As related to excess calories and reduction in physical activity.  As evidenced by patient with current BMI of 38.  Intervention:  . Instruction and discussion as noted above. . Patient has been making efforts to control food choices and portions; she is motivated to continue. . Established goals for additional changes with input from patient.  . Scheduled follow up visit in 2 weeks per patient request  Education Materials given:  Marland Kitchen DASH diet overview and tips . Following a Low Sodium Diet . Plate Planner with food lists, sample meal pattern . Sample menus . Visit summary with goals/ instructions   Learner/ who was taught:  . Patient   Level of understanding: Marland Kitchen Verbalizes/ demonstrates competency   Demonstrated degree of understanding via:   Teach back Learning barriers: . None  Willingness to learn/ readiness for change: . Eager, change in progress   Monitoring and Evaluation:  Dietary intake, exercise, BP control, and body weight      follow up: 04/06/21 at 1:30pm

## 2021-03-10 ENCOUNTER — Other Ambulatory Visit: Payer: Self-pay | Admitting: Physician Assistant

## 2021-03-10 DIAGNOSIS — I10 Essential (primary) hypertension: Secondary | ICD-10-CM

## 2021-03-24 ENCOUNTER — Ambulatory Visit: Payer: Managed Care, Other (non HMO) | Admitting: Dietician

## 2021-04-14 ENCOUNTER — Ambulatory Visit: Payer: Managed Care, Other (non HMO) | Admitting: Dietician

## 2021-04-18 ENCOUNTER — Telehealth: Payer: Self-pay

## 2021-04-18 ENCOUNTER — Encounter: Payer: Self-pay | Admitting: Nurse Practitioner

## 2021-04-18 ENCOUNTER — Other Ambulatory Visit: Payer: Self-pay

## 2021-04-18 ENCOUNTER — Ambulatory Visit: Payer: Managed Care, Other (non HMO) | Admitting: Nurse Practitioner

## 2021-04-18 VITALS — BP 139/82 | HR 80 | Temp 98.2°F | Wt 232.2 lb

## 2021-04-18 DIAGNOSIS — N3001 Acute cystitis with hematuria: Secondary | ICD-10-CM | POA: Diagnosis not present

## 2021-04-18 DIAGNOSIS — R350 Frequency of micturition: Secondary | ICD-10-CM | POA: Diagnosis not present

## 2021-04-18 LAB — URINALYSIS, ROUTINE W REFLEX MICROSCOPIC
Bilirubin, UA: NEGATIVE
Glucose, UA: NEGATIVE
Ketones, UA: NEGATIVE
Nitrite, UA: NEGATIVE
Protein,UA: NEGATIVE
Specific Gravity, UA: <1.005 — ABNORMAL LOW (ref 1.005–1.030)
Urobilinogen, Ur: 0.2 mg/dL (ref 0.2–1.0)
pH, UA: 6 (ref 5.0–7.5)

## 2021-04-18 LAB — MICROSCOPIC EXAMINATION: Bacteria, UA: NONE SEEN

## 2021-04-18 MED ORDER — NITROFURANTOIN MONOHYD MACRO 100 MG PO CAPS: 100.0000 mg | ORAL_CAPSULE | Freq: Two times a day (BID) | ORAL | 0 refills | Status: DC

## 2021-04-18 NOTE — Telephone Encounter (Signed)
Copied from CRM [id number]. Topic: Appointment Scheduling - Scheduling Inquiry for Clinic >> Apr 18, 2021  8:05 AM Wyonia Hough E wrote: Reason for CRM: Pt needs appt for possible UTI / please advise

## 2021-04-18 NOTE — Progress Notes (Signed)
Acute Office Visit  Subjective:    Patient ID: Ashley Ballard, female    DOB: 1966/04/30, 55 y.o.   MRN: [id number]  Chief Complaint  Patient presents with   Urinary Tract Infection    Pt states she has been experiencing urinary frequency for the past 2 days    HPI Patient is in today for urinary frequency for the past 2 days.  URINARY SYMPTOMS  Dysuria: no Urinary frequency: yes Urgency: yes Small volume voids: yes Symptom severity:  mild Urinary incontinence: no Foul odor: no Hematuria: no Abdominal pain: no Back pain: no Suprapubic pain/pressure: no Flank pain: no Fever:  no Vomiting: no Relief with cranberry juice:  n/a Relief with pyridium:  n/a Status: stable Previous urinary tract infection: no Recurrent urinary tract infection: no History of sexually transmitted disease: no Treatments attempted: increasing fluids    Past Medical History:  Diagnosis Date   Family history of adverse reaction to anesthesia    mother had problems   GERD (gastroesophageal reflux disease)    Hypertension     Past Surgical History:  Procedure Laterality Date   BIOPSY ENDOMETRIAL  01/21/2014   BREAST CYST EXCISION Left 1983   DILATATION & CURETTAGE/HYSTEROSCOPY WITH MYOSURE N/A 03/04/2018   Procedure: DILATATION & CURETTAGE/HYSTEROSCOPY;  Surgeon: Conard Novak, MD;  Location: [facility] OR[facility];  Service: Gynecology;  Laterality: N/A;   TUBAL LIGATION      Family History  Problem Relation Age of Onset   Hypertension Mother    Diabetes Mother    Breast cancer Mother 5   Pneumonia Paternal Grandmother    Leukemia Paternal Grandfather    Breast cancer Maternal Grandmother 33   Heart attack Paternal Uncle    Kidney cancer Maternal Grandfather     Social History   Socioeconomic History   Marital status: Married    Spouse name: Casimiro Needle   Number of children: 2   Years of education: Not on file   Highest education level: Not on file  Occupational History    Occupation: full time  Tobacco Use   Smoking status: Never   Smokeless tobacco: Never  Vaping Use   Vaping Use: Never used  Substance and Sexual Activity   Alcohol use: No    Alcohol/week: 0.0 standard drinks   Drug use: No   Sexual activity: Yes  Other Topics Concern   Not on file  Social History Narrative   Not on file   Social Determinants of Health   Financial Resource Strain: Not on file  Food Insecurity: Not on file  Transportation Needs: Not on file  Physical Activity: Not on file  Stress: Not on file  Social Connections: Not on file  Intimate Partner Violence: Not on file    Outpatient Medications Prior to Visit  Medication Sig Dispense Refill   amLODipine (NORVASC) 5 MG tablet TAKE 1 TABLET BY MOUTH  DAILY 90 tablet 0   Ascorbic Acid (VITAMIN C) 1000 MG tablet Take 2,000 mg by mouth daily.     calcium carbonate (OSCAL) 1500 (600 Ca) MG TABS tablet Take by mouth.     cetirizine (ZYRTEC) 10 MG tablet TAKE 1 TABLET(10 MG) BY MOUTH DAILY 90 tablet 1   Cholecalciferol (VITAMIN D-3) 5000 units TABS Take 5,000 Units by mouth daily.     fluticasone (FLONASE) 50 MCG/ACT nasal spray Place 2 sprays into both nostrils daily. 16 g 11   furosemide (LASIX) 20 MG tablet TAKE 1 TABLET(20 MG) BY MOUTH DAILY AS NEEDED  FOR FLUID RETENTION 90 tablet 3   ibuprofen (ADVIL) 800 MG tablet Take 1 tablet (800 mg total) by mouth every 8 (eight) hours as needed. for pain 270 tablet 1   loratadine (CLARITIN) 10 MG tablet Take 10 mg by mouth daily.     No facility-administered medications prior to visit.    Allergies  Allergen Reactions   Lisinopril Itching    Mouth Itching    Review of Systems  Constitutional: Negative.   HENT: Negative.    Respiratory: Negative.    Cardiovascular: Negative.   Gastrointestinal: Negative.   Genitourinary:  Positive for frequency and urgency. Negative for dysuria.  Musculoskeletal: Negative.   Skin: Negative.   Neurological: Negative.        Objective:    Physical Exam Vitals and nursing note reviewed.  Constitutional:      General: She is not in acute distress.    Appearance: Normal appearance.  HENT:     Head: Normocephalic.  Eyes:     Conjunctiva/sclera: Conjunctivae normal.  Cardiovascular:     Rate and Rhythm: Normal rate and regular rhythm.     Pulses: Normal pulses.     Heart sounds: Normal heart sounds.  Pulmonary:     Effort: Pulmonary effort is normal.     Breath sounds: Normal breath sounds.  Abdominal:     Palpations: Abdomen is soft.     Tenderness: There is no abdominal tenderness. There is no right CVA tenderness or left CVA tenderness.  Musculoskeletal:     Cervical back: Normal range of motion.  Skin:    General: Skin is warm.  Neurological:     General: No focal deficit present.     Mental Status: She is alert and oriented to person, place, and time.  Psychiatric:        Mood and Affect: Mood normal.        Behavior: Behavior normal.        Thought Content: Thought content normal.        Judgment: Judgment normal.    BP 139/82   Pulse 80   Temp 98.2 F (36.8 C) (Oral)   Wt 232 lb 3.2 oz (105.3 kg)   LMP 11/19/2015   SpO2 98%   BMI 37.48 kg/m  Wt Readings from Last 3 Encounters:  04/18/21 232 lb 3.2 oz (105.3 kg)  03/06/21 240 lb (108.9 kg)  01/13/21 238 lb 8 oz (108.2 kg)    Health Maintenance Due  Topic Date Due   Zoster Vaccines- Shingrix (1 of 2) Never done   COVID-19 Vaccine (4 - Booster for Moderna series) 12/30/2020   PAP SMEAR-Modifier  12/31/2020    There are no preventive care reminders to display for this patient.   Lab Results  Component Value Date   TSH 1.690 07/25/2020   Lab Results  Component Value Date   WBC 4.2 07/23/2019   HGB 12.4 07/23/2019   HCT 37.1 07/23/2019   MCV 94 07/23/2019   PLT 174 07/23/2019   Lab Results  Component Value Date   NA 143 07/23/2019   K 4.1 07/23/2019   CO2 21 07/23/2019   GLUCOSE 82 07/23/2019   BUN 14 07/23/2019    CREATININE 0.8 06/16/2020   BILITOT 0.9 07/23/2019   ALKPHOS 97 07/23/2019   AST 17 07/23/2019   ALT 20 07/23/2019   PROT 6.8 07/23/2019   ALBUMIN 4.5 07/23/2019   CALCIUM 9.0 07/23/2019   ANIONGAP 9 01/17/2016   Lab Results  Component  Value Date   CHOL 181 06/16/2020   Lab Results  Component Value Date   HDL 43 06/16/2020   Lab Results  Component Value Date   LDLCALC 120 06/16/2020   Lab Results  Component Value Date   TRIG 101 06/16/2020   Lab Results  Component Value Date   CHOLHDL 3.3 07/23/2019   Lab Results  Component Value Date   HGBA1C 5.0 07/23/2019       Assessment & Plan:   Problem List Items Addressed This Visit   None Visit Diagnoses     Urinary frequency    -  Primary   U/A positive for leukocytes and trace blood. Will treat for UTI   Relevant Orders   Urinalysis, Routine w reflex microscopic   Acute cystitis with hematuria       Urine sent for culture. Will treat with macrobid x5 days. Increase fluid intake. Follow-up if symptoms don't improve.    Relevant Orders   Urine Culture        Meds ordered this encounter  Medications   nitrofurantoin, macrocrystal-monohydrate, (MACROBID) 100 MG capsule    Sig: Take 1 capsule (100 mg total) by mouth 2 (two) times daily.    Dispense:  10 capsule    Refill:  0     Gerre Scull, NP

## 2021-04-18 NOTE — Telephone Encounter (Signed)
Pt is scheduled with Lauren at [facility] this afternoon.

## 2021-04-21 LAB — URINE CULTURE

## 2021-05-04 ENCOUNTER — Encounter: Payer: Self-pay | Admitting: Dietician

## 2021-05-04 NOTE — Progress Notes (Signed)
Have note heard back from patient to reschedule her second missed appointment on 04/14/21. Sent notification to referring provider.

## 2021-06-05 ENCOUNTER — Other Ambulatory Visit: Payer: Self-pay | Admitting: Family Medicine

## 2021-06-05 DIAGNOSIS — I10 Essential (primary) hypertension: Secondary | ICD-10-CM

## 2021-07-28 ENCOUNTER — Encounter: Payer: Self-pay | Admitting: Physician Assistant

## 2021-08-03 ENCOUNTER — Encounter: Payer: Managed Care, Other (non HMO) | Admitting: Family Medicine

## 2021-08-29 ENCOUNTER — Other Ambulatory Visit: Payer: Self-pay | Admitting: Physician Assistant

## 2021-08-29 DIAGNOSIS — I10 Essential (primary) hypertension: Secondary | ICD-10-CM

## 2021-09-05 ENCOUNTER — Other Ambulatory Visit: Payer: Self-pay

## 2021-09-05 ENCOUNTER — Encounter: Payer: Self-pay | Admitting: Family Medicine

## 2021-09-05 ENCOUNTER — Encounter: Payer: Managed Care, Other (non HMO) | Admitting: Family Medicine

## 2021-09-05 ENCOUNTER — Ambulatory Visit (INDEPENDENT_AMBULATORY_CARE_PROVIDER_SITE_OTHER): Payer: Managed Care, Other (non HMO) | Admitting: Family Medicine

## 2021-09-05 VITALS — BP 130/80 | HR 90 | Temp 98.5°F | Resp 16 | Ht 65.5 in | Wt 230.6 lb

## 2021-09-05 DIAGNOSIS — E559 Vitamin D deficiency, unspecified: Secondary | ICD-10-CM

## 2021-09-05 DIAGNOSIS — E538 Deficiency of other specified B group vitamins: Secondary | ICD-10-CM | POA: Diagnosis not present

## 2021-09-05 DIAGNOSIS — Z124 Encounter for screening for malignant neoplasm of cervix: Secondary | ICD-10-CM

## 2021-09-05 DIAGNOSIS — I10 Essential (primary) hypertension: Secondary | ICD-10-CM

## 2021-09-05 DIAGNOSIS — Z1239 Encounter for other screening for malignant neoplasm of breast: Secondary | ICD-10-CM

## 2021-09-05 DIAGNOSIS — Z0000 Encounter for general adult medical examination without abnormal findings: Secondary | ICD-10-CM | POA: Insufficient documentation

## 2021-09-05 DIAGNOSIS — Z6837 Body mass index (BMI) 37.0-37.9, adult: Secondary | ICD-10-CM

## 2021-09-05 NOTE — Patient Instructions (Signed)
   The CDC recommends two doses of Shingrix (the shingles vaccine) separated by 2 to 6 months for adults age 57 years and older. I recommend checking with your insurance plan regarding coverage for this vaccine.   

## 2021-09-05 NOTE — Assessment & Plan Note (Signed)
Well controlled Continue on amlodipine 5mg  F/u in 6 months

## 2021-09-05 NOTE — Assessment & Plan Note (Signed)
Discussed importance of healthy weight management Discussed diet and exercise  

## 2021-09-05 NOTE — Progress Notes (Signed)
Complete physical exam   Patient: Ashley Ballard   DOB: 01/07/1966   55 y.o. Female  MRN: [id number] Visit Date: 09/05/2021  Today's healthcare provider: Shirlee Latch, MD   Chief Complaint  Patient presents with   Annual Exam   Subjective    Ashley Ballard is a 55 y.o. female who presents today for a complete physical exam.  She reports consuming a general diet. Home exercise routine includes walking and aerobics. She generally feels well. She reports sleeping fairly well. She does not have additional problems to discuss today.    Past Medical History:  Diagnosis Date   Family history of adverse reaction to anesthesia    mother had problems   GERD (gastroesophageal reflux disease)    Hypertension    Past Surgical History:  Procedure Laterality Date   BIOPSY ENDOMETRIAL  01/21/2014   BREAST CYST EXCISION Left 1983   DILATATION & CURETTAGE/HYSTEROSCOPY WITH MYOSURE N/A 03/04/2018   Procedure: DILATATION & CURETTAGE/HYSTEROSCOPY;  Surgeon: Conard Novak, MD;  Location: [facility] ORS;  Service: Gynecology;  Laterality: N/A;   TUBAL LIGATION     Social History   Socioeconomic History   Marital status: Married    Spouse name: Casimiro Needle   Number of children: 2   Years of education: Not on file   Highest education level: Not on file  Occupational History   Occupation: full time  Tobacco Use   Smoking status: Never   Smokeless tobacco: Never  Vaping Use   Vaping Use: Never used  Substance and Sexual Activity   Alcohol use: No    Alcohol/week: 0.0 standard drinks   Drug use: No   Sexual activity: Yes  Other Topics Concern   Not on file  Social History Narrative   Not on file   Social Determinants of Health   Financial Resource Strain: Not on file  Food Insecurity: Not on file  Transportation Needs: Not on file  Physical Activity: Not on file  Stress: Not on file  Social Connections: Not on file  Intimate Partner Violence: Not on file   Family Status   Relation Name Status   Mother  Alive   PGM  Deceased   PGF  Deceased   Sister 1 Alive   Brother 1 Alive   MGM  Deceased   Sister 1 Alive   Nutritional therapist  (Not Specified)   MGF  (Not Specified)   Sister 1 Alive   Family History  Problem Relation Age of Onset   Hypertension Mother    Diabetes Mother    Breast cancer Mother 36   Pneumonia Paternal Grandmother    Leukemia Paternal Grandfather    Breast cancer Maternal Grandmother 76   Heart attack Paternal Uncle    Kidney cancer Maternal Grandfather    Allergies  Allergen Reactions   Lisinopril Itching    Mouth Itching    Patient Care Team: Alfredia Ferguson, PA-C as PCP - General (Physician Assistant)   Medications: Outpatient Medications Prior to Visit  Medication Sig   amLODipine (NORVASC) 5 MG tablet TAKE 1 TABLET BY MOUTH  DAILY   Ascorbic Acid (VITAMIN C) 1000 MG tablet Take 2,000 mg by mouth daily.   calcium carbonate (OSCAL) 1500 (600 Ca) MG TABS tablet Take by mouth.   cetirizine (ZYRTEC) 10 MG tablet TAKE 1 TABLET(10 MG) BY MOUTH DAILY   Cholecalciferol (VITAMIN D-3) 5000 units TABS Take 5,000 Units by mouth daily.   fluticasone (FLONASE) 50 MCG/ACT nasal  spray Place 2 sprays into both nostrils daily.   furosemide (LASIX) 20 MG tablet TAKE 1 TABLET(20 MG) BY MOUTH DAILY AS NEEDED FOR FLUID RETENTION   ibuprofen (ADVIL) 800 MG tablet Take 1 tablet (800 mg total) by mouth every 8 (eight) hours as needed. for pain   loratadine (CLARITIN) 10 MG tablet Take 10 mg by mouth daily.   nitrofurantoin, macrocrystal-monohydrate, (MACROBID) 100 MG capsule Take 1 capsule (100 mg total) by mouth 2 (two) times daily.   No facility-administered medications prior to visit.    Review of Systems  HENT: Negative.    Eyes: Negative.   Respiratory: Negative.    Cardiovascular: Negative.   Gastrointestinal: Negative.   Endocrine:       Vasomotor symptoms - hot flashes  Genitourinary: Negative.   Musculoskeletal: Negative.   Skin:  Negative.   Allergic/Immunologic: Negative.   Neurological: Negative.   Hematological: Negative.   Psychiatric/Behavioral: Negative.       Objective    BP 130/80 (BP Location: Right Arm, Patient Position: Sitting, Cuff Size: Large)   Pulse 90   Temp 98.5 F (36.9 C) (Oral)   Resp 16   Ht 5' 5.5" (1.664 m)   Wt 230 lb 9.6 oz (104.6 kg)   LMP 11/19/2015   SpO2 98%   BMI 37.79 kg/m    Physical Exam Vitals reviewed.  Constitutional:      General: She is not in acute distress.    Appearance: Normal appearance. She is not ill-appearing or toxic-appearing.  HENT:     Head: Normocephalic and atraumatic.     Right Ear: External ear normal.     Left Ear: External ear normal.     Nose: Nose normal.     Mouth/Throat:     Mouth: Mucous membranes are moist.     Pharynx: Oropharynx is clear. No oropharyngeal exudate or posterior oropharyngeal erythema.  Eyes:     General: No scleral icterus.    Extraocular Movements: Extraocular movements intact.     Conjunctiva/sclera: Conjunctivae normal.     Pupils: Pupils are equal, round, and reactive to light.  Cardiovascular:     Rate and Rhythm: Normal rate and regular rhythm.     Pulses: Normal pulses.     Heart sounds: Normal heart sounds. No murmur heard.   No friction rub. No gallop.  Pulmonary:     Effort: Pulmonary effort is normal. No respiratory distress.     Breath sounds: Normal breath sounds. No wheezing or rhonchi.  Chest:     Chest wall: No tenderness.  Abdominal:     General: Abdomen is flat. There is no distension.     Palpations: Abdomen is soft.     Tenderness: There is no abdominal tenderness.  Genitourinary:    General: Normal vulva.  Musculoskeletal:        General: Normal range of motion.     Cervical back: Normal range of motion and neck supple.     Right lower leg: No edema.     Left lower leg: No edema.  Skin:    General: Skin is warm and dry.     Capillary Refill: Capillary refill takes less than 2  seconds.     Findings: No lesion or rash.  Neurological:     General: No focal deficit present.     Mental Status: She is alert and oriented to person, place, and time. Mental status is at baseline.  Psychiatric:        Mood  and Affect: Mood normal.     Last depression screening scores PHQ 2/9 Scores 09/05/2021 03/06/2021 01/13/2021  PHQ - 2 Score 0 0 0  PHQ- 9 Score - - 0   Last fall risk screening Fall Risk  09/05/2021  Falls in the past year? 0  Number falls in past yr: 0  Injury with Fall? 0  Risk for fall due to : -  Follow up -   Last Audit-C alcohol use screening Alcohol Use Disorder Test (AUDIT) 09/05/2021  1. How often do you have a drink containing alcohol? 0  2. How many drinks containing alcohol do you have on a typical day when you are drinking? 0  3. How often do you have six or more drinks on one occasion? 0  AUDIT-C Score 0  Alcohol Brief Interventions/Follow-up -   A score of 3 or more in women, and 4 or more in men indicates increased risk for alcohol abuse, EXCEPT if all of the points are from question 1   No results found for any visits on 09/05/21.  Assessment & Plan    Routine Health Maintenance and Physical Exam  Exercise Activities and Dietary recommendations  Goals   None     Immunization History  Administered Date(s) Administered   Influenza,inj,Quad PF,6+ Mos 10/26/2019   Moderna Sars-Covid-2 Vaccination 12/31/2019, 01/28/2020, 09/01/2020   Td 12/26/2018   Tdap 10/27/2008    Health Maintenance  Topic Date Due   Zoster Vaccines- Shingrix (1 of 2) Never done   COVID-19 Vaccine (4 - Booster for Moderna series) 10/27/2020   PAP SMEAR-Modifier  12/31/2020   INFLUENZA VACCINE  05/22/2021   MAMMOGRAM  10/18/2022   COLONOSCOPY (Pts 45-71yrs Insurance coverage will need to be confirmed)  03/17/2024   TETANUS/TDAP  12/25/2028   Hepatitis C Screening  Completed   HIV Screening  Completed   Pneumococcal Vaccine 25-110 Years old  Aged Out    HPV VACCINES  Aged Out    Discussed health benefits of physical activity, and encouraged her to engage in regular exercise appropriate for her age and condition.  Problem List Items Addressed This Visit       Cardiovascular and Mediastinum   Essential hypertension    Well controlled Continue on amlodipine 5mg  F/u in 6 months        Other   Adiposity    Discussed importance of healthy weight management Discussed diet and exercise      Avitaminosis D   Relevant Orders   VITAMIN D 25 Hydroxy (Vit-D Deficiency, Fractures)   B12 deficiency   Relevant Orders   Vitamin B12   Annual physical exam - Primary   Relevant Orders   Comprehensive metabolic panel   Lipid Panel With LDL/HDL Ratio   Other Visit Diagnoses     Primary hypertension       Encounter for breast cancer screening using non-mammogram modality       Relevant Orders   MM 3D SCREEN BREAST BILATERAL   Encounter for screening for cervical cancer       Relevant Orders   Pap IG (Image Guided)        Return in about 6 months (around 03/05/2022) for chronic disease f/u, With new PCP.     03/07/2022, Medical Student 09/05/2021, 12:06 PM   Patient seen along with MS3 student 09/07/2021. I personally evaluated this patient along with the student, and verified all aspects of the history, physical exam, and medical decision making as documented  by the student. I agree with the student's documentation and have made all necessary edits.  Ebubechukwu Jedlicka, Marzella Schlein, MD, MPH [facility]

## 2021-09-06 LAB — PAP IG (IMAGE GUIDED): PAP Smear Comment: 0

## 2021-09-14 LAB — COMPREHENSIVE METABOLIC PANEL
ALT: 21 IU/L (ref 0–32)
AST: 17 IU/L (ref 0–40)
Albumin/Globulin Ratio: 2.1 (ref 1.2–2.2)
Albumin: 4.6 g/dL (ref 3.8–4.9)
Alkaline Phosphatase: 115 IU/L (ref 44–121)
BUN/Creatinine Ratio: 12 (ref 9–23)
BUN: 9 mg/dL (ref 6–24)
Bilirubin Total: 0.9 mg/dL (ref 0.0–1.2)
CO2: 23 mmol/L (ref 20–29)
Calcium: 9.3 mg/dL (ref 8.7–10.2)
Chloride: 106 mmol/L (ref 96–106)
Creatinine, Ser: 0.75 mg/dL (ref 0.57–1.00)
Globulin, Total: 2.2 g/dL (ref 1.5–4.5)
Glucose: 91 mg/dL (ref 70–99)
Potassium: 4.6 mmol/L (ref 3.5–5.2)
Sodium: 143 mmol/L (ref 134–144)
Total Protein: 6.8 g/dL (ref 6.0–8.5)
eGFR: 93 mL/min/{1.73_m2} (ref >59)

## 2021-09-14 LAB — LIPID PANEL WITH LDL/HDL RATIO
Cholesterol, Total: 155 mg/dL (ref 100–199)
HDL: 49 mg/dL (ref >39)
LDL Chol Calc (NIH): 94 mg/dL (ref 0–99)
LDL/HDL Ratio: 1.9 ratio (ref 0.0–3.2)
Triglycerides: 59 mg/dL (ref 0–149)
VLDL Cholesterol Cal: 12 mg/dL (ref 5–40)

## 2021-09-14 LAB — VITAMIN B12: Vitamin B-12: >2000 pg/mL — ABNORMAL HIGH (ref 232–1245)

## 2021-09-14 LAB — VITAMIN D 25 HYDROXY (VIT D DEFICIENCY, FRACTURES): Vit D, 25-Hydroxy: 54.6 ng/mL (ref 30.0–100.0)

## 2021-10-26 ENCOUNTER — Other Ambulatory Visit: Payer: Self-pay

## 2021-10-26 ENCOUNTER — Ambulatory Visit
Admission: RE | Admit: 2021-10-26 | Discharge: 2021-10-26 | Disposition: A | Discharge status: Home / Self Care | Payer: Managed Care, Other (non HMO) | Source: Ambulatory Visit | Attending: Family Medicine | Admitting: Family Medicine

## 2021-10-26 DIAGNOSIS — Z1239 Encounter for other screening for malignant neoplasm of breast: Secondary | ICD-10-CM | POA: Diagnosis present

## 2021-10-26 DIAGNOSIS — Z1231 Encounter for screening mammogram for malignant neoplasm of breast: Secondary | ICD-10-CM | POA: Diagnosis not present

## 2021-12-01 ENCOUNTER — Encounter: Payer: Self-pay | Admitting: Family Medicine

## 2021-12-01 ENCOUNTER — Other Ambulatory Visit: Payer: Self-pay | Admitting: *Deleted

## 2021-12-01 DIAGNOSIS — J301 Allergic rhinitis due to pollen: Secondary | ICD-10-CM

## 2021-12-01 MED ORDER — CETIRIZINE HCL 10 MG PO TABS: ORAL_TABLET | ORAL | 1 refills | Status: DC

## 2022-01-14 ENCOUNTER — Encounter: Payer: Self-pay | Admitting: Physician Assistant

## 2022-01-17 ENCOUNTER — Other Ambulatory Visit: Payer: Self-pay | Admitting: Physician Assistant

## 2022-01-17 DIAGNOSIS — R600 Localized edema: Secondary | ICD-10-CM

## 2022-01-17 MED ORDER — FUROSEMIDE 20 MG PO TABS: ORAL_TABLET | ORAL | 0 refills | Status: DC

## 2022-02-26 ENCOUNTER — Other Ambulatory Visit: Payer: Self-pay

## 2022-02-26 ENCOUNTER — Encounter: Payer: Self-pay | Admitting: Emergency Medicine

## 2022-02-26 ENCOUNTER — Ambulatory Visit
Admission: EM | Admit: 2022-02-26 | Discharge: 2022-02-26 | Disposition: A | Discharge status: Home / Self Care | Payer: Managed Care, Other (non HMO) | Attending: Emergency Medicine | Admitting: Emergency Medicine

## 2022-02-26 DIAGNOSIS — J0100 Acute maxillary sinusitis, unspecified: Secondary | ICD-10-CM | POA: Diagnosis not present

## 2022-02-26 MED ORDER — AMOXICILLIN-POT CLAVULANATE 875-125 MG PO TABS: 1.0000 | ORAL_TABLET | Freq: Two times a day (BID) | ORAL | 0 refills | Status: DC

## 2022-02-26 MED ORDER — PREDNISONE 20 MG PO TABS: 40.0000 mg | ORAL_TABLET | Freq: Every day | ORAL | 0 refills | Status: DC

## 2022-02-26 NOTE — Discharge Instructions (Signed)
The Augmentin twice daily with food for 7 days for treatment of your sinusitis. ? ?Starting tomorrow begin use of steroids every morning with food for 5 days ? ?Perform sinus irrigation 2-3 times a day with a [employer] sinus rinse kit and distilled water.  Do not use tap water. ? ?You can use plain over-the-counter Mucinex every 6 hours to break up the stickiness of the mucus so your body can clear it. ? ?Increase your oral fluid intake to thin out your mucus so that is also able for your body to clear more easily. ? ?Continue use of daily Flonase to further help clear sinuses ? ? ?If you develop any new or worsening symptoms return for reevaluation or see your primary care provider.  ? ?

## 2022-02-26 NOTE — ED Triage Notes (Signed)
Pt c/o nasal congestion, runny nose, cough. Started about 7 days ago. Denies fever.  ?

## 2022-02-26 NOTE — ED Provider Notes (Signed)
?[facility] ? ? ? ?CSN: [id number] ?Arrival date & time: 02/26/22  1828 ? ? ?  ? ?History   ?Chief Complaint ?Chief Complaint  ?Patient presents with  ? Nasal Congestion  ? ? ?HPI ?Ashley Ballard is a 56 y.o. female.  ? ?Patient presents with nasal congestion, ear pain, rhinorrhea, sore throat, sinus pressure and productive cough for 7 days.  Initially symptoms began after being outside cutting grass, relates it to her allergies.  However symptoms have been worsening with no signs of improvement.  Has attempted use of Allegra, Zyrtec, Mucinex DM, saline rinses and Flonase which have been minimally helpful.  Denies shortness of breath, wheezing, chest pain or tightness.  History of sinusitis, GERD and hypertension. ? ? ? ? ?Past Medical History:  ?Diagnosis Date  ? Family history of adverse reaction to anesthesia   ? mother had problems  ? GERD (gastroesophageal reflux disease)   ? Hypertension   ? ? ?Patient Active Problem List  ? Diagnosis Date Noted  ? Annual physical exam 09/05/2021  ? Endometrial polyp 01/08/2018  ? Postmenopausal bleeding 12/31/2017  ? B12 deficiency 04/12/2017  ? Essential hypertension 01/06/2016  ? Chronic sinusitis 05/02/2015  ? Abnormal ECG 04/07/2015  ? Bloodgood disease 04/07/2015  ? Acid reflux 04/07/2015  ? Low back pain with sciatica 04/07/2015  ? Adiposity 04/07/2015  ? Hemorrhage, postmenopausal 04/07/2015  ? Pain 04/07/2015  ? Avitaminosis D 04/07/2015  ? ? ?Past Surgical History:  ?Procedure Laterality Date  ? BIOPSY ENDOMETRIAL  01/21/2014  ? BREAST CYST EXCISION Left 1983  ? DILATATION & CURETTAGE/HYSTEROSCOPY WITH MYOSURE N/A 03/04/2018  ? Procedure: DILATATION & CURETTAGE/HYSTEROSCOPY;  Surgeon: Conard Novak, MD;  Location: [facility];  Service: Gynecology;  Laterality: N/A;  ? TUBAL LIGATION    ? ? ?OB History   ? ? Gravida  ?2  ? Para  ?2  ? Term  ?2  ? Preterm  ?   ? AB  ?   ? Living  ?2  ?  ? ? SAB  ?   ? IAB  ?   ? Ectopic  ?   ? Multiple  ?   ? Live Births   ?2  ?   ?  ?  ? ? ? ?Home Medications   ? ?Prior to Admission medications   ?Medication Sig Start Date End Date Taking? Authorizing Provider  ?amLODipine (NORVASC) 5 MG tablet TAKE 1 TABLET BY MOUTH  DAILY 09/01/21  Yes Jacky Kindle, FNP  ?Ascorbic Acid (VITAMIN C) 1000 MG tablet Take 2,000 mg by mouth daily.   Yes [provider]  ?calcium carbonate (OSCAL) 1500 (600 Ca) MG TABS tablet Take by mouth. 11/08/11  Yes [provider]  ?cetirizine (ZYRTEC) 10 MG tablet TAKE 1 TABLET(10 MG) BY MOUTH DAILY 12/01/21  Yes Bacigalupo, Marzella Schlein, MD  ?Cholecalciferol (VITAMIN D-3) 5000 units TABS Take 5,000 Units by mouth daily.   Yes [provider]  ?fluticasone (FLONASE) 50 MCG/ACT nasal spray Place 2 sprays into both nostrils daily. 09/11/19  Yes Margaretann Loveless, PA-C  ?furosemide (LASIX) 20 MG tablet TAKE 1 TABLET(20 MG) BY MOUTH DAILY AS NEEDED FOR FLUID RETENTION 01/17/22  Yes Alfredia Ferguson, PA-C  ?ibuprofen (ADVIL) 800 MG tablet Take 1 tablet (800 mg total) by mouth every 8 (eight) hours as needed. for pain 04/08/20   Margaretann Loveless, PA-C  ?loratadine (CLARITIN) 10 MG tablet Take 10 mg by mouth daily.    [provider]  ?nitrofurantoin, macrocrystal-monohydrate, (MACROBID) 100 MG capsule Take 1 capsule (100 mg total) by mouth 2 (two) times daily. 04/18/21   Gerre ScullMcElwee, Lauren A, NP  ? ? ?Family History ?Family History  ?Problem Relation Age of Onset  ? Hypertension Mother   ? Diabetes Mother   ? Breast cancer Mother [id number]  ? Pneumonia Paternal Grandmother   ? Leukemia Paternal Grandfather   ? Breast cancer Maternal Grandmother [id number]  ? Heart attack Paternal Uncle   ? Kidney cancer Maternal Grandfather   ? ? ?Social History ?Social History  ? ?Tobacco Use  ? Smoking status: Never  ? Smokeless tobacco: Never  ?Vaping Use  ? Vaping Use: Never used  ?Substance Use Topics  ? Alcohol use: No  ?  Alcohol/week: 0.0 standard drinks  ? Drug use: No  ? ? ? ?Allergies    ?Lisinopril ? ? ?Review of Systems ?Review of Systems  ?Constitutional: Negative.   ?HENT:  Positive for congestion, ear pain, rhinorrhea and sore throat. Negative for dental problem, ear discharge, facial swelling, hearing loss, mouth sores, nosebleeds, postnasal drip, sinus pressure, sinus pain, sneezing, tinnitus, trouble swallowing and voice change.   ?Respiratory:  Positive for cough. Negative for apnea, choking, chest tightness, shortness of breath, wheezing and stridor.   ?Cardiovascular: Negative.   ?Gastrointestinal: Negative.   ?Skin: Negative.   ?Neurological: Negative.   ? ? ?Physical Exam ?Triage Vital Signs ?ED Triage Vitals  ?Enc Vitals Group  ?   BP 02/26/22 1847 (!) 153/98  ?   Pulse Rate 02/26/22 1847 83  ?   Resp 02/26/22 1847 16  ?   Temp 02/26/22 1847 98.4 ?F (36.9 ?C)  ?   Temp Source 02/26/22 1847 Oral  ?   SpO2 02/26/22 1847 98 %  ?   Weight 02/26/22 1845 230 lb 9.6 oz (104.6 kg)  ?   Height 02/26/22 1845 5' 5.5" (1.664 m)  ?   Head Circumference --   ?   Peak Flow --   ?   Pain Score 02/26/22 1844 0  ?   Pain Loc --   ?   Pain Edu? --   ?   Excl. in GC? --   ? ?No data found. ? ?Updated Vital Signs ?BP (!) 153/98 (BP Location: Right Arm)   Pulse 83   Temp 98.4 ?F (36.9 ?C) (Oral)   Resp 16   Ht 5' 5.5" (1.664 m)   Wt 230 lb 9.6 oz (104.6 kg)   LMP 11/19/2015   SpO2 98%   BMI 37.79 kg/m?  ? ?Visual Acuity ?Right Eye Distance:   ?Left Eye Distance:   ?Bilateral Distance:   ? ?Right Eye Near:   ?Left Eye Near:    ?Bilateral Near:    ? ?Physical Exam ?Constitutional:   ?   Appearance: Normal appearance.  ?HENT:  ?   Head: Normocephalic.  ?   Right Ear: Tympanic membrane, ear canal and external ear normal.  ?   Left Ear: Tympanic membrane, ear canal and external ear normal.  ?   Nose: Congestion and rhinorrhea present.  ?   Right Turbinates: Swollen.  ?   Left Turbinates: Swollen.  ?   Right Sinus: Maxillary sinus tenderness present. No frontal sinus tenderness.  ?   Left Sinus:  Maxillary sinus tenderness present. No frontal sinus tenderness.  ?   Mouth/Throat:  ?   Mouth: Mucous membranes are moist.  ?   Pharynx: Oropharynx is clear.  ?Eyes:  ?  Extraocular Movements: Extraocular movements intact.  ?Cardiovascular:  ?   Rate and Rhythm: Normal rate and regular rhythm.  ?   Pulses: Normal pulses.  ?   Heart sounds: Normal heart sounds.  ?Pulmonary:  ?   Effort: Pulmonary effort is normal.  ?   Breath sounds: Normal breath sounds.  ?Musculoskeletal:  ?   Cervical back: Normal range of motion.  ?Skin: ?   General: Skin is warm and dry.  ?Neurological:  ?   Mental Status: She is alert and oriented to person, place, and time. Mental status is at baseline.  ?Psychiatric:     ?   Mood and Affect: Mood normal.     ?   Behavior: Behavior normal.  ? ? ? ?UC Treatments / Results  ?Labs ?(all labs ordered are listed, but only abnormal results are displayed) ?Labs Reviewed - No data to display ? ?EKG ? ? ?Radiology ?No results found. ? ?Procedures ?Procedures (including critical care time) ? ?Medications Ordered in UC ?Medications - No data to display ? ?Initial Impression / Assessment and Plan / UC Course  ?I have reviewed the triage vital signs and the nursing notes. ? ?Pertinent labs & imaging results that were available during my care of the patient were reviewed by me and considered in my medical decision making (see chart for details). ? ?Acute nonrecurrent maxillary sinusitis ? ?Vital signs are stable, O2 saturation 98% on room air, lungs are clear to auscultation, low suspicion for pneumonia, pneumothorax or bronchitis, will defer imaging, symptomology is consistent with a sinus infection as patient has been using standard supportive care with no signs of improvement and symptoms are persisting past 7 days will begin use of antibiotic, Augmentin 7-day course prescribed and patient requesting prednisone course as this typically will help reduce symptoms as well, prednisone 40 mg burst  prescribed, recommended continued supportive care with follow-up with PCP, ENT or urgent care as needed ?Final Clinical Impressions(s) / UC Diagnoses  ? ?Final diagnoses:  ?None  ? ?Discharge Instructions   ?None ?  ? ?ED Prescriptions   ?None ?  ?

## 2022-03-06 ENCOUNTER — Ambulatory Visit: Payer: Managed Care, Other (non HMO) | Admitting: Physician Assistant

## 2022-03-06 ENCOUNTER — Encounter: Payer: Self-pay | Admitting: Physician Assistant

## 2022-03-06 VITALS — BP 133/82 | HR 80 | Ht 65.0 in | Wt 241.0 lb

## 2022-03-06 DIAGNOSIS — J301 Allergic rhinitis due to pollen: Secondary | ICD-10-CM | POA: Diagnosis not present

## 2022-03-06 DIAGNOSIS — I10 Essential (primary) hypertension: Secondary | ICD-10-CM

## 2022-03-06 MED ORDER — CETIRIZINE HCL 10 MG PO TABS: ORAL_TABLET | ORAL | 1 refills | Status: DC

## 2022-03-06 NOTE — Progress Notes (Signed)
?  ? ?I,[name],acting as a Education administrator for [employer], PA-C.,have documented all relevant documentation on the behalf of Mikey Kirschner, PA-C,as directed by  Mikey Kirschner, PA-C while in the presence of Mikey Kirschner, PA-C.  ? ?Established patient visit ? ? ?Patient: Ashley Ballard   DOB: 1966/04/07   56 y.o. Female  MRN: [id number] ?Visit Date: 03/06/2022 ? ?Today's healthcare provider: Mikey Kirschner, PA-C  ? ?Cc. Htn f/u  ? ?Subjective  ?  ?HPI  ?Hypertension, follow-up ? ?BP Readings from Last 3 Encounters:  ?03/06/22 133/82  ?02/26/22 (!) 153/98  ?09/05/21 130/80  ? Wt Readings from Last 3 Encounters:  ?03/06/22 241 lb (109.3 kg)  ?02/26/22 230 lb 9.6 oz (104.6 kg)  ?09/05/21 230 lb 9.6 oz (104.6 kg)  ?  ? ?She was last seen for hypertension 6 months ago.  ?BP at that visit was 130/80. Management since that visit includes continue current medication (amlodipine 5 mg). ? ?She states she was originally on lisinopril / hctz, which gave her an adverse reaction. When she was switched to amlodipine lasix 20 mg was added as well. She takes this maybe 2 times a week.  ? ? ?She reports excellent compliance with treatment. ?She is not having side effects.  ?She is following a Low Sodium diet. ?She is exercising. ?She does not smoke. ? ?Use of agents associated with hypertension: none.  ? ?Outside blood pressures are 130s/80s. ?Symptoms: ?No chest pain No chest pressure  ?No palpitations No syncope  ?No dyspnea No orthopnea  ?No paroxysmal nocturnal dyspnea No lower extremity edema  ? ?Pertinent labs ?Lab Results  ?Component Value Date  ? CHOL 155 09/13/2021  ? HDL 49 09/13/2021  ? Guy 94 09/13/2021  ? TRIG 59 09/13/2021  ? CHOLHDL 3.3 07/23/2019  ? Lab Results  ?Component Value Date  ? NA 143 09/13/2021  ? K 4.6 09/13/2021  ? CREATININE 0.75 09/13/2021  ? EGFR 93 09/13/2021  ? GLUCOSE 91 09/13/2021  ? TSH 1.690 07/25/2020  ?  ? ?The 10-year ASCVD risk score (Arnett DK, et al., 2019) is:  5.8% ? ?---------------------------------------------------------------------------------------------------  ? ?Medications: ?Outpatient Medications Prior to Visit  ?Medication Sig  ? amLODipine (NORVASC) 5 MG tablet TAKE 1 TABLET BY MOUTH  DAILY  ? Ascorbic Acid (VITAMIN C) 1000 MG tablet Take 2,000 mg by mouth daily.  ? calcium carbonate (OSCAL) 1500 (600 Ca) MG TABS tablet Take by mouth.  ? Cholecalciferol (VITAMIN D-3) 5000 units TABS Take 5,000 Units by mouth daily.  ? fluticasone (FLONASE) 50 MCG/ACT nasal spray Place 2 sprays into both nostrils daily.  ? furosemide (LASIX) 20 MG tablet TAKE 1 TABLET(20 MG) BY MOUTH DAILY AS NEEDED FOR FLUID RETENTION  ? ibuprofen (ADVIL) 800 MG tablet Take 1 tablet (800 mg total) by mouth every 8 (eight) hours as needed. for pain  ? nitrofurantoin, macrocrystal-monohydrate, (MACROBID) 100 MG capsule Take 1 capsule (100 mg total) by mouth 2 (two) times daily.  ? [DISCONTINUED] cetirizine (ZYRTEC) 10 MG tablet TAKE 1 TABLET(10 MG) BY MOUTH DAILY  ? [DISCONTINUED] loratadine (CLARITIN) 10 MG tablet Take 10 mg by mouth daily.  ? BINAXNOW COVID-19 AG HOME TEST KIT TEST AS DIRECTED TODAY  ? [DISCONTINUED] amoxicillin-clavulanate (AUGMENTIN) 875-125 MG tablet Take 1 tablet by mouth every 12 (twelve) hours. (Patient not taking: Reported on 03/06/2022)  ? [DISCONTINUED] predniSONE (DELTASONE) 20 MG tablet Take 2 tablets (40 mg total) by mouth daily. (Patient not taking: Reported on 03/06/2022)  ? ?No facility-administered medications  prior to visit.  ? ? ?Review of Systems  ?Constitutional:  Negative for fatigue and fever.  ?Respiratory:  Negative for cough and shortness of breath.   ?Cardiovascular:  Negative for chest pain and leg swelling.  ?Gastrointestinal:  Negative for abdominal pain.  ?Neurological:  Negative for dizziness and headaches.  ? ?  Objective  ?  ?BP 133/82   Pulse 80   Ht 5' 5" (1.651 m)   Wt 241 lb (109.3 kg)   LMP 11/19/2015   SpO2 99%   BMI 40.10 kg/m? Blood  pressure 133/82, pulse 80, height 5' 5" (1.651 m), weight 241 lb (109.3 kg), last menstrual period 11/19/2015, SpO2 99 %.  ?Physical Exam ?Constitutional:   ?   General: She is awake.  ?   Appearance: She is well-developed.  ?HENT:  ?   Head: Normocephalic.  ?Eyes:  ?   Conjunctiva/sclera: Conjunctivae normal.  ?Cardiovascular:  ?   Rate and Rhythm: Normal rate and regular rhythm.  ?   Heart sounds: Normal heart sounds.  ?Pulmonary:  ?   Effort: Pulmonary effort is normal.  ?   Breath sounds: Normal breath sounds.  ?Skin: ?   General: Skin is warm.  ?Neurological:  ?   Mental Status: She is alert and oriented to person, place, and time.  ?Psychiatric:     ?   Attention and Perception: Attention normal.     ?   Mood and Affect: Mood normal.     ?   Speech: Speech normal.     ?   Behavior: Behavior is cooperative.  ?  ? ?No results found for any visits on 03/06/22. ? Assessment & Plan  ?  ? ?Problem List Items Addressed This Visit   ? ?  ? Cardiovascular and Mediastinum  ? Essential hypertension - Primary  ?  Within range today, continue amlodipine 5 mg  ?Reviewed last cmp; due to lasix use will check again ?Pt denies any considerable peripheral edema ?Wondering if lasix is necessary -- advised she to d/c and see if she develops edema ?Okay with biweekly use if needed but likely able to d/c ? ?  ?  ? Relevant Orders  ? Comprehensive Metabolic Panel (CMET)  ? ?Other Visit Diagnoses   ? ? Seasonal allergic rhinitis due to pollen      ? Relevant Medications  ? cetirizine (ZYRTEC) 10 MG tablet  ? ?  ?  ? ?Return in about 6 months (around 09/06/2022) for hypertension.  ?   ? ?I, Mikey Kirschner, PA-C have reviewed all documentation for this visit. The documentation on5/16/2023 for the exam, diagnosis, procedures, and orders are all accurate and complete. ? ?Mikey Kirschner, PA-C ?[facility] ?[id number] ?[place], [place], [address] ?Office: [phone] ?Fax: [phone]  ? ?[facility] ? ?

## 2022-03-06 NOTE — Assessment & Plan Note (Addendum)
Within range today, continue amlodipine 5 mg  ?Reviewed last cmp; due to lasix use will check again ?Pt denies any considerable peripheral edema ?Wondering if lasix is necessary -- advised she to d/c and see if she develops edema ?Okay with biweekly use if needed but likely able to d/c ?

## 2022-03-07 LAB — COMPREHENSIVE METABOLIC PANEL
ALT: 18 IU/L (ref 0–32)
AST: 16 IU/L (ref 0–40)
Albumin/Globulin Ratio: 2 (ref 1.2–2.2)
Albumin: 4.7 g/dL (ref 3.8–4.9)
Alkaline Phosphatase: 103 IU/L (ref 44–121)
BUN/Creatinine Ratio: 16 (ref 9–23)
BUN: 12 mg/dL (ref 6–24)
Bilirubin Total: 1.4 mg/dL — ABNORMAL HIGH (ref 0.0–1.2)
CO2: 21 mmol/L (ref 20–29)
Calcium: 9.1 mg/dL (ref 8.7–10.2)
Chloride: 103 mmol/L (ref 96–106)
Creatinine, Ser: 0.73 mg/dL (ref 0.57–1.00)
Globulin, Total: 2.4 g/dL (ref 1.5–4.5)
Glucose: 93 mg/dL (ref 70–99)
Potassium: 4.9 mmol/L (ref 3.5–5.2)
Sodium: 139 mmol/L (ref 134–144)
Total Protein: 7.1 g/dL (ref 6.0–8.5)
eGFR: 95 mL/min/{1.73_m2} (ref >59)

## 2022-03-20 ENCOUNTER — Other Ambulatory Visit: Payer: Self-pay | Admitting: Physician Assistant

## 2022-03-20 DIAGNOSIS — R600 Localized edema: Secondary | ICD-10-CM

## 2022-07-06 ENCOUNTER — Other Ambulatory Visit: Payer: Self-pay | Admitting: Physician Assistant

## 2022-07-06 ENCOUNTER — Encounter: Payer: Self-pay | Admitting: Physician Assistant

## 2022-07-06 DIAGNOSIS — I10 Essential (primary) hypertension: Secondary | ICD-10-CM

## 2022-07-06 MED ORDER — AMLODIPINE BESYLATE 5 MG PO TABS: 5.0000 mg | ORAL_TABLET | Freq: Every day | ORAL | 0 refills | Status: DC

## 2022-07-06 NOTE — Telephone Encounter (Signed)
Pt called in for assistance. Pt says that she is out of town and forgot her bp medication at home. Pt would like to know if provider would send in a supply to the pharmacy where she is at for now?   Medication: amLODipine (NORVASC) 5 MG tablet    Pharmacy: [facility] [address] [place], [place] [address]  Phone: [phone]     Pt's phone: [phone] for call back for confirmation

## 2022-07-06 NOTE — Telephone Encounter (Signed)
Patient would like a follow up call today once prescription has been sent to pharmacy.

## 2022-07-06 NOTE — Telephone Encounter (Signed)
Requested by interface surescripts. Called pharmacy and patient picked up medication approx. 30 minutes ago.  Requested Prescriptions  Refused Prescriptions Disp Refills  . amLODipine (NORVASC) 5 MG tablet 90 tablet 3    Sig: Take 1 tablet (5 mg total) by mouth daily.     Cardiovascular: Calcium Channel Blockers 2 Passed - 07/06/2022  3:00 PM      Passed - Last BP in normal range    BP Readings from Last 1 Encounters:  03/06/22 133/82         Passed - Last Heart Rate in normal range    Pulse Readings from Last 1 Encounters:  03/06/22 80         Passed - Valid encounter within last 6 months    Recent Outpatient Visits          4 months ago Essential hypertension   [facility] Alfredia Ferguson, PA-C   10 months ago Annual physical exam   [facility] [place], Marzella Schlein, MD   1 year ago Urinary frequency   [facility] McElwee, Lauren A, NP   1 year ago Class 2 severe obesity due to excess calories with serious comorbidity and body mass index (BMI) of 38.0 to 38.9 in adult [facility])   [facility] [place], Alessandra Bevels, [place]   1 year ago Annual physical exam   [facility] [place], Alessandra Be[place], [place]      Future Appointments            In 2 months [place], Lillia Abed, PA-C [facility]

## 2022-07-06 NOTE — Telephone Encounter (Signed)
Pt is calling back to follow up . Patient would like a follow up call today once prescription has been sent to pharmacy.   Please advise.

## 2022-07-06 NOTE — Telephone Encounter (Signed)
Called pharmacy to confirm patient has picked up medications. Pharmacy staff reports patient picked up medication 30 minutes ago .

## 2022-07-10 ENCOUNTER — Ambulatory Visit: Payer: Managed Care, Other (non HMO) | Admitting: Physician Assistant

## 2022-07-10 VITALS — BP 145/87 | HR 94 | Temp 97.7°F | Wt 237.0 lb

## 2022-07-10 DIAGNOSIS — I10 Essential (primary) hypertension: Secondary | ICD-10-CM | POA: Diagnosis not present

## 2022-07-10 NOTE — Progress Notes (Signed)
    Established patient visit   Patient: Ashley Ballard   DOB: 11/24/1965   56 y.o. Female  MRN: [id number] Visit Date: 07/10/2022  Today's healthcare provider: Janna Ostwalt, PA-C   CC: BP check  Subjective    HPI  Hypertension, follow-up  BP Readings from Last 3 Encounters:  07/10/22 (!) 145/87  03/06/22 133/82  02/26/22 (!) 153/98   Wt Readings from Last 3 Encounters:  07/10/22 237 lb (107.5 kg)  03/06/22 241 lb (109.3 kg)  02/26/22 230 lb 9.6 oz (104.6 kg)     She was last seen for hypertension 4 months ago.  BP at that visit was as above. Management since that visit includes none.  She reports good compliance with treatment. She is not having side effects.    Outside blood pressures are 130-150 over 90s. Symptoms: No chest pain No chest pressure  No palpitations No syncope  No dyspnea No orthopnea  No paroxysmal nocturnal dyspnea No lower extremity edema   Pertinent labs Lab Results  Component Value Date   CHOL 155 09/13/2021   HDL 49 09/13/2021   LDLCALC 94 09/13/2021   TRIG 59 09/13/2021   CHOLHDL 3.3 07/23/2019   Lab Results  Component Value Date   NA 139 03/06/2022   K 4.9 03/06/2022   CREATININE 0.73 03/06/2022   EGFR 95 03/06/2022   GLUCOSE 93 03/06/2022   TSH 1.690 07/25/2020     The 10-year ASCVD risk score (Arnett DK, et al., 2019) is: 7.6%  ---------------------------------------------------------------------------------------------------   Medications: Outpatient Medications Prior to Visit  Medication Sig   amLODipine (NORVASC) 5 MG tablet Take 1 tablet (5 mg total) by mouth daily.   Ascorbic Acid (VITAMIN C) 1000 MG tablet Take 2,000 mg by mouth daily.   calcium carbonate (OSCAL) 1500 (600 Ca) MG TABS tablet Take by mouth.   cetirizine (ZYRTEC) 10 MG tablet TAKE 1 TABLET(10 MG) BY MOUTH DAILY   Cholecalciferol (VITAMIN D-3) 5000 units TABS Take 5,000 Units by mouth daily.   fluticasone (FLONASE) 50 MCG/ACT nasal spray Place 2  sprays into both nostrils daily.   furosemide (LASIX) 20 MG tablet TAKE 1 TABLET BY MOUTH DAILY AS  NEEDED FOR FLUID RETENTION   ibuprofen (ADVIL) 800 MG tablet Take 1 tablet (800 mg total) by mouth every 8 (eight) hours as needed. for pain   [DISCONTINUED] amLODipine (NORVASC) 5 MG tablet TAKE 1 TABLET BY MOUTH  DAILY   [DISCONTINUED] BINAXNOW COVID-19 AG HOME TEST KIT TEST AS DIRECTED TODAY   [DISCONTINUED] nitrofurantoin, macrocrystal-monohydrate, (MACROBID) 100 MG capsule Take 1 capsule (100 mg total) by mouth 2 (two) times daily.   No facility-administered medications prior to visit.    Review of Systems  All other systems reviewed and are negative.  Except see HPI     Objective    BP (!) 145/87 (BP Location: Right Arm, Patient Position: Sitting, Cuff Size: Large)   Pulse 94   Temp 97.7 F (36.5 C) (Oral)   Wt 237 lb (107.5 kg)   LMP 11/19/2015   SpO2 100%   BMI 39.44 kg/m     Physical Exam Vitals reviewed.  Constitutional:      General: She is not in acute distress.    Appearance: Normal appearance. She is well-developed. She is not diaphoretic.  HENT:     Head: Normocephalic and atraumatic.     Nose: Nose normal.  Eyes:     General: No scleral icterus.    Extraocular Movements: Extraocular   movements intact.     Conjunctiva/sclera: Conjunctivae normal.     Pupils: Pupils are equal, round, and reactive to light.  Neck:     Thyroid: No thyromegaly.  Cardiovascular:     Rate and Rhythm: Normal rate and regular rhythm.     Pulses: Normal pulses.     Heart sounds: Normal heart sounds. No murmur heard. Pulmonary:     Effort: Pulmonary effort is normal. No respiratory distress.     Breath sounds: Normal breath sounds. No wheezing, rhonchi or rales.  Abdominal:     General: Abdomen is flat. Bowel sounds are normal.     Palpations: Abdomen is soft.  Musculoskeletal:        General: Normal range of motion.     Cervical back: Normal range of motion and neck supple.      Right lower leg: No edema.     Left lower leg: No edema.  Lymphadenopathy:     Cervical: No cervical adenopathy.  Skin:    General: Skin is warm and dry.     Findings: No rash.  Neurological:     General: No focal deficit present.     Mental Status: She is alert and oriented to person, place, and time. Mental status is at baseline.     Sensory: No sensory deficit.     Motor: No weakness.     Coordination: Coordination normal.     Gait: Gait normal.     Deep Tendon Reflexes: Reflexes normal.  Psychiatric:        Behavior: Behavior normal.        Thought Content: Thought content normal.        Judgment: Judgment normal.       No results found for any visits on 07/10/22.  Assessment & Plan     1. Primary hypertension Chronic and unstable BP today was 145/87 Pt believes it could be due to stress of getting to a clinic on time Amlodipine to 7.61m daily  Low salt diet and daily exercise encouraged Weight loss of 5% of her current weight within 6 mo recommended her current BMI 38.44 Encouraged to continue BP log and bring with her to the next appt As well as her BP devise   FU in 2 weeks     The patient was advised to call back or seek an in-person evaluation if the symptoms worsen or if the condition fails to improve as anticipated.  I discussed the assessment and treatment plan with the patient. The patient was provided an opportunity to ask questions and all were answered. The patient agreed with the plan and demonstrated an understanding of the instructions.  The entirety of the information documented in the History of Present Illness, Review of Systems and Physical Exam were personally obtained by me. Portions of this information were initially documented by the CMA and reviewed by me for thoroughness and accuracy.  Portions of this note were created using dictation software and may contain typographical errors.     JMardene Speak PA-C  [phone](phone) [phone](fax)  [place]

## 2022-07-13 ENCOUNTER — Ambulatory Visit: Payer: Managed Care, Other (non HMO) | Admitting: Physician Assistant

## 2022-07-19 ENCOUNTER — Ambulatory Visit: Payer: Managed Care, Other (non HMO) | Admitting: Physician Assistant

## 2022-07-23 NOTE — Progress Notes (Signed)
I,[name],acting as a Education administrator for [employer], PA-C.,have documented all relevant documentation on the behalf of Mikey Kirschner, PA-C,as directed by  Mikey Kirschner, PA-C while in the presence of Mikey Kirschner, PA-C.   Established patient visit   Patient: Ashley Ballard   DOB: Dec 14, 1965   56 y.o. Female  MRN: [id number] Visit Date: 07/24/2022  Today's healthcare provider: Mikey Kirschner, PA-C   Cc. Htn f/u  Subjective    HPI  Hypertension, follow-up  BP Readings from Last 3 Encounters:  07/24/22 135/75  07/10/22 (!) 145/87  03/06/22 133/82   Wt Readings from Last 3 Encounters:  07/24/22 237 lb 12.8 oz (107.9 kg)  07/10/22 237 lb (107.5 kg)  03/06/22 241 lb (109.3 kg)     She was last seen for hypertension 2 weeks ago.  BP at that visit was 145/87. Management since that visit includes increase Amlodipine to 7.56m daily.  She reports excellent compliance with treatment. She is not having side effects. She is following a Low Sodium diet. She is exercising. She does not smoke.  Use of agents associated with hypertension: none.   Outside blood pressures are 111/73-124/85 Symptoms: No chest pain No chest pressure  No palpitations No syncope  No dyspnea No orthopnea  No paroxysmal nocturnal dyspnea No lower extremity edema   Pertinent labs Lab Results  Component Value Date   CHOL 155 09/13/2021   HDL 49 09/13/2021   LDLCALC 94 09/13/2021   TRIG 59 09/13/2021   CHOLHDL 3.3 07/23/2019   Lab Results  Component Value Date   NA 139 03/06/2022   K 4.9 03/06/2022   CREATININE 0.73 03/06/2022   EGFR 95 03/06/2022   GLUCOSE 93 03/06/2022   TSH 1.690 07/25/2020     The 10-year ASCVD risk score (Arnett DK, et al., 2019) is: 6.1%  ---------------------------------------------------------------------------------------------------   Medications: Outpatient Medications Prior to Visit  Medication Sig   amLODipine (NORVASC) 5 MG tablet Take 1 tablet (5  mg total) by mouth daily. (Patient taking differently: Take 7.5 mg by mouth daily.)   Ascorbic Acid (VITAMIN C) 1000 MG tablet Take 2,000 mg by mouth daily.   calcium carbonate (OSCAL) 1500 (600 Ca) MG TABS tablet Take by mouth.   cetirizine (ZYRTEC) 10 MG tablet TAKE 1 TABLET(10 MG) BY MOUTH DAILY   Cholecalciferol (VITAMIN D-3) 5000 units TABS Take 5,000 Units by mouth daily.   fluticasone (FLONASE) 50 MCG/ACT nasal spray Place 2 sprays into both nostrils daily.   furosemide (LASIX) 20 MG tablet TAKE 1 TABLET BY MOUTH DAILY AS  NEEDED FOR FLUID RETENTION   ibuprofen (ADVIL) 800 MG tablet Take 1 tablet (800 mg total) by mouth every 8 (eight) hours as needed. for pain   No facility-administered medications prior to visit.    Review of Systems  Constitutional:  Negative for fatigue and fever.  Respiratory:  Negative for cough and shortness of breath.   Cardiovascular:  Negative for chest pain and leg swelling.  Gastrointestinal:  Negative for abdominal pain.  Neurological:  Negative for dizziness and headaches.       Objective    BP 135/75 (BP Location: Left Arm, Patient Position: Sitting, Cuff Size: Large)   Pulse 85   Ht 5' 5.5" (1.664 m)   Wt 237 lb 12.8 oz (107.9 kg)   LMP 11/19/2015   SpO2 100%   BMI 38.97 kg/m  Blood pressure 135/75, pulse 85, height 5' 5.5" (1.664 m), weight 237 lb 12.8 oz (107.9 kg),  last menstrual period 11/19/2015, SpO2 100 %.   Physical Exam Constitutional:      General: She is awake.     Appearance: She is well-developed.  HENT:     Head: Normocephalic.  Eyes:     Conjunctiva/sclera: Conjunctivae normal.  Cardiovascular:     Rate and Rhythm: Normal rate and regular rhythm.     Heart sounds: Normal heart sounds.  Pulmonary:     Effort: Pulmonary effort is normal.     Breath sounds: Normal breath sounds.  Musculoskeletal:     Right lower leg: No edema.     Left lower leg: No edema.  Skin:    General: Skin is warm.  Neurological:      Mental Status: She is alert and oriented to person, place, and time.  Psychiatric:        Attention and Perception: Attention normal.        Mood and Affect: Mood normal.        Speech: Speech normal.        Behavior: Behavior is cooperative.     No results found for any visits on 07/24/22.  Assessment & Plan     Problem List Items Addressed This Visit       Cardiovascular and Mediastinum   Essential hypertension - Primary    Last visit BP was uncontrolled, amlodipine was increased to 7.5 mg  Now within more consistent range Ordered cmp F/u 4 mo       Relevant Orders   Comprehensive Metabolic Panel (CMET)   CBC w/Diff/Platelet   Other Visit Diagnoses     Obesity (BMI 30-39.9)       Relevant Orders   Lipid Profile   HgB A1c   TSH   CBC w/Diff/Platelet        Return in about 4 months (around 11/24/2022) for CPE.      I, Mikey Kirschner, PA-C have reviewed all documentation for this visit. The documentation on  07/24/2022 for the exam, diagnosis, procedures, and orders are all accurate and complete.  Mikey Kirschner, PA-C [facility] [address] [place], [place], [address] Office: [phone] Fax: [place]

## 2022-07-24 ENCOUNTER — Encounter: Payer: Self-pay | Admitting: Physician Assistant

## 2022-07-24 ENCOUNTER — Ambulatory Visit: Payer: Managed Care, Other (non HMO) | Admitting: Physician Assistant

## 2022-07-24 VITALS — BP 135/75 | HR 85 | Ht 65.5 in | Wt 237.8 lb

## 2022-07-24 DIAGNOSIS — I10 Essential (primary) hypertension: Secondary | ICD-10-CM

## 2022-07-24 DIAGNOSIS — E669 Obesity, unspecified: Secondary | ICD-10-CM | POA: Diagnosis not present

## 2022-07-24 NOTE — Assessment & Plan Note (Addendum)
Last visit BP was uncontrolled, amlodipine was increased to 7.5 mg  Now within more consistent range Ordered cmp F/u 4 mo

## 2022-08-01 ENCOUNTER — Other Ambulatory Visit: Payer: Self-pay | Admitting: Family Medicine

## 2022-08-01 DIAGNOSIS — I10 Essential (primary) hypertension: Secondary | ICD-10-CM

## 2022-08-24 ENCOUNTER — Encounter: Payer: Self-pay | Admitting: Physician Assistant

## 2022-08-24 ENCOUNTER — Ambulatory Visit
Admission: EM | Admit: 2022-08-24 | Discharge: 2022-08-24 | Disposition: A | Discharge status: Home / Self Care | Payer: Managed Care, Other (non HMO) | Attending: Family Medicine | Admitting: Family Medicine

## 2022-08-24 DIAGNOSIS — J069 Acute upper respiratory infection, unspecified: Secondary | ICD-10-CM | POA: Insufficient documentation

## 2022-08-24 LAB — RAPID INFLUENZA A&B ANTIGENS
Influenza A (ARMC): NEGATIVE
Influenza B (ARMC): NEGATIVE

## 2022-08-24 NOTE — ED Triage Notes (Signed)
Pt states that she has a sore throat. X1 day  Pt states that she has been exposed to Flu.

## 2022-08-24 NOTE — ED Provider Notes (Signed)
[facility]    CSN: [id number] Arrival date & time: 08/24/22  1043      History   Chief Complaint Chief Complaint  Patient presents with   Sore Throat    Sore throat. X1 day. Exposed to Flu    HPI Ashley Ballard is a 56 y.o. female.   HPI   Ashley Ballard presents after influenza exposure.  She was around a family member who has tested positive for the flu.  She started having symptoms yesterday.  Endorses sore throat and myalgias.  Denies fever, cough, chills, rhinorrhea, vomiting, nausea, diarrhea, belly pain, chest tightness, chest pain and rash.      Past Medical History:  Diagnosis Date   Family history of adverse reaction to anesthesia    mother had problems   GERD (gastroesophageal reflux disease)    Hypertension     Patient Active Problem List   Diagnosis Date Noted   Annual physical exam 09/05/2021   Endometrial polyp 01/08/2018   Postmenopausal bleeding 12/31/2017   B12 deficiency 04/12/2017   Essential hypertension 01/06/2016   Chronic sinusitis 05/02/2015   Abnormal ECG 04/07/2015   Bloodgood disease 04/07/2015   Acid reflux 04/07/2015   Low back pain with sciatica 04/07/2015   Adiposity 04/07/2015   Hemorrhage, postmenopausal 04/07/2015   Pain 04/07/2015   Avitaminosis D 04/07/2015    Past Surgical History:  Procedure Laterality Date   BIOPSY ENDOMETRIAL  01/21/2014   BREAST CYST EXCISION Left 1983   DILATATION & CURETTAGE/HYSTEROSCOPY WITH MYOSURE N/A 03/04/2018   Procedure: DILATATION & CURETTAGE/HYSTEROSCOPY;  Surgeon: Conard Novak, MD;  Location: [facility] ORS;  Service: Gynecology;  Laterality: N/A;   TUBAL LIGATION      OB History     Gravida  2   Para  2   Term  2   Preterm      AB      Living  2      SAB      IAB      Ectopic      Multiple      Live Births  2            Home Medications    Prior to Admission medications   Medication Sig Start Date End Date Taking? Authorizing Provider   amLODipine (NORVASC) 5 MG tablet Take 1.5 tablets (7.5 mg total) by mouth daily. 08/02/22  Yes Alfredia Ferguson, PA-C  Ascorbic Acid (VITAMIN C) 1000 MG tablet Take 2,000 mg by mouth daily.   Yes [provider]  calcium carbonate (OSCAL) 1500 (600 Ca) MG TABS tablet Take by mouth. 11/08/11  Yes [provider]  cetirizine (ZYRTEC) 10 MG tablet TAKE 1 TABLET(10 MG) BY MOUTH DAILY 03/06/22  Yes Drubel, Lillia Abed, PA-C  Cholecalciferol (VITAMIN D-3) 5000 units TABS Take 5,000 Units by mouth daily.   Yes [provider]  fluticasone (FLONASE) 50 MCG/ACT nasal spray Place 2 sprays into both nostrils daily. 09/11/19  Yes Margaretann Loveless, PA-C  furosemide (LASIX) 20 MG tablet TAKE 1 TABLET BY MOUTH DAILY AS  NEEDED FOR FLUID RETENTION 03/20/22  Yes Drubel, Lillia Abed, PA-C  ibuprofen (ADVIL) 800 MG tablet Take 1 tablet (800 mg total) by mouth every 8 (eight) hours as needed. for pain 04/08/20  Yes Margaretann Loveless, PA-C    Family History Family History  Problem Relation Age of Onset   Hypertension Mother    Diabetes Mother    Breast cancer Mother 70   Pneumonia Paternal  Grandmother    Leukemia Paternal Grandfather    Breast cancer Maternal Grandmother 11   Heart attack Paternal Uncle    Kidney cancer Maternal Grandfather     Social History Social History   Tobacco Use   Smoking status: Never   Smokeless tobacco: Never  Vaping Use   Vaping Use: Never used  Substance Use Topics   Alcohol use: No    Alcohol/week: 0.0 standard drinks of alcohol   Drug use: No     Allergies   Lisinopril   Review of Systems Review of Systems: negative unless otherwise stated in HPI.      Physical Exam Triage Vital Signs ED Triage Vitals  Enc Vitals Group     BP 08/24/22 1342 138/88     Pulse Rate 08/24/22 1342 77     Resp 08/24/22 1342 18     Temp 08/24/22 1342 97.8 F (36.6 C)     Temp Source 08/24/22 1342 Oral     SpO2 08/24/22 1342 100 %     Weight 08/24/22  1341 235 lb (106.6 kg)     Height 08/24/22 1341 5\' 5"  (1.651 m)     Head Circumference --      Peak Flow --      Pain Score 08/24/22 1341 4     Pain Loc --      Pain Edu? --      Excl. in GC? --    No data found.  Updated Vital Signs BP 138/88 (BP Location: Right Arm)   Pulse 77   Temp 97.8 F (36.6 C) (Oral)   Resp 18   Ht 5\' 5"  (1.651 m)   Wt 106.6 kg   LMP 11/19/2015   SpO2 100%   BMI 39.11 kg/m   Visual Acuity Right Eye Distance:   Left Eye Distance:   Bilateral Distance:    Right Eye Near:   Left Eye Near:    Bilateral Near:     Physical Exam GEN:     alert, non-toxic appearing female in no distress     HENT:  mucus membranes moist, oropharyngeal  without lesions or  exudate, no  tonsillar hypertrophy,   mild oropharyngeal erythema, no nasal discharge,  bilateral TM  normal EYES:   pupils equal and reactive, EOMi ,  no scleral injection NECK:  normal ROM RESP:  no increased work of breathing,  clear to auscultation bilaterally CVS:   regular rate  and rhythm Skin:   warm and dry, no rash on visible skin , normal  skin turgor    UC Treatments / Results  Labs (all labs ordered are listed, but only abnormal results are displayed) Labs Reviewed  RAPID INFLUENZA A&B ANTIGENS    EKG   Radiology No results found.  Procedures Procedures (including critical care time)  Medications Ordered in UC Medications - No data to display  Initial Impression / Assessment and Plan / UC Course  I have reviewed the triage vital signs and the nursing notes.  Pertinent labs & imaging results that were available during my care of the patient were reviewed by me and considered in my medical decision making (see chart for details).       Pt is a 56 y.o. female who presents for 1 days of respiratory symptoms after in-home influenza exposure. [name] is  afebrile here without recent antipyretics. Satting well on room air. Overall pt is  well appearing, well hydrated,  without respiratory distress. Pulmonary exam  is  unremarkable.  Influenza testing obtained. Declined COVID testing. History consistent with  viral respiratory illness. Discussed symptomatic treatment.  Explained lack of efficacy of antibiotics in viral disease.  Typical duration of symptoms discussed.  Return and ED precautions given and patient/parent  voiced understanding.  Discussed MDM, treatment plan and plan for follow-up with patient/parent who agrees with plan.     Final Clinical Impressions(s) / UC Diagnoses   Final diagnoses:  Viral URI with cough     Discharge Instructions      We will contact you if your  test is positive.  Please quarantine while you wait for the results.     You can take Tylenol and/or Ibuprofen as needed for fever reduction and pain relief.    For cough: honey 1/2 to 1 teaspoon (you can dilute the honey in water or another fluid).  You can also use guaifenesin and dextromethorphan for cough. You can use a humidifier for chest congestion and cough.  If you don't have a humidifier, you can sit in the bathroom with the hot shower running.      For sore throat: try warm salt water gargles, cepacol lozenges, throat spray, warm tea or water with lemon/honey, popsicles or ice, or OTC cold relief medicine for throat discomfort.    For congestion: take a daily anti-histamine like Zyrtec, Claritin, and a oral decongestant, such as pseudoephedrine.  You can also use Flonase 1-2 sprays in each nostril daily.    It is important to stay hydrated: drink plenty of fluids (water, gatorade/powerade/pedialyte, juices, or teas) to keep your throat moisturized and help further relieve irritation/discomfort.    Return or go to the Emergency Department if symptoms worsen or do not improve in the next few days     ED Prescriptions   None    PDMP not reviewed this encounter.   Lyndee Hensen, DO 08/24/22 1417

## 2022-08-24 NOTE — Discharge Instructions (Signed)
We will contact you if your test is positive.  Please quarantine while you wait for the results.    You can take Tylenol and/or Ibuprofen as needed for fever reduction and pain relief.    For cough: honey 1/2 to 1 teaspoon (you can dilute the honey in water or another fluid).  You can also use guaifenesin and dextromethorphan for cough. You can use a humidifier for chest congestion and cough.  If you don't have a humidifier, you can sit in the bathroom with the hot shower running.      For sore throat: try warm salt water gargles, cepacol lozenges, throat spray, warm tea or water with lemon/honey, popsicles or ice, or OTC cold relief medicine for throat discomfort.    For congestion: take a daily anti-histamine like Zyrtec, Claritin, and a oral decongestant, such as pseudoephedrine.  You can also use Flonase 1-2 sprays in each nostril daily.    It is important to stay hydrated: drink plenty of fluids (water, gatorade/powerade/pedialyte, juices, or teas) to keep your throat moisturized and help further relieve irritation/discomfort.    Return or go to the Emergency Department if symptoms worsen or do not improve in the next few days  

## 2022-08-30 ENCOUNTER — Telehealth: Payer: Managed Care, Other (non HMO) | Admitting: Physician Assistant

## 2022-08-30 DIAGNOSIS — J019 Acute sinusitis, unspecified: Secondary | ICD-10-CM

## 2022-08-30 DIAGNOSIS — B9789 Other viral agents as the cause of diseases classified elsewhere: Secondary | ICD-10-CM | POA: Diagnosis not present

## 2022-08-30 MED ORDER — PREDNISONE 20 MG PO TABS: 40.0000 mg | ORAL_TABLET | Freq: Every day | ORAL | 0 refills | Status: DC

## 2022-08-30 NOTE — Progress Notes (Signed)
Virtual Visit Consent   Ashley Ballard, you are scheduled for a virtual visit with a [facility] provider today. Just as with appointments in the office, your consent must be obtained to participate. Your consent will be active for this visit and any virtual visit you may have with one of our providers in the next 365 days. If you have a MyChart account, a copy of this consent can be sent to you electronically.  As this is a virtual visit, video technology does not allow for your provider to perform a traditional examination. This may limit your provider's ability to fully assess your condition. If your provider identifies any concerns that need to be evaluated in person or the need to arrange testing (such as labs, EKG, etc.), we will make arrangements to do so. Although advances in technology are sophisticated, we cannot ensure that it will always work on either your end or our end. If the connection with a video visit is poor, the visit may have to be switched to a telephone visit. With either a video or telephone visit, we are not always able to ensure that we have a secure connection.  By engaging in this virtual visit, you consent to the provision of healthcare and authorize for your insurance to be billed (if applicable) for the services provided during this visit. Depending on your insurance coverage, you may receive a charge related to this service.  I need to obtain your verbal consent now. Are you willing to proceed with your visit today? Lashonta B Palmer has provided verbal consent on 08/30/2022 for a virtual visit (video or telephone). Ashley Ballard, [place]  Date: 08/30/2022 4:43 PM  Virtual Visit via Video Note   I, Ashley Ballard, connected with  TIENNA BIENKOWSKI  ([id number], February 06, 1966) on 08/30/22 at  4:30 PM EST by a video-enabled telemedicine application and verified that I am speaking with the correct person using two identifiers.  Location: Patient: Virtual Visit Location  Patient: Home Provider: Virtual Visit Location Provider: Home Office   I discussed the limitations of evaluation and management by telemedicine and the availability of in person appointments. The patient expressed understanding and agreed to proceed.    History of Present Illness: Ashley Ballard is a 56 y.o. who identifies as a female who was assigned female at birth, and is being seen today for continued sinus symptoms. Notes symptoms over past week with nasal congestion and sinus pressure with clear sinus drainage and occasional headache. Denies fever, chills, aches. Was seen at UC a few days ago due to symptoms and concern of flu exposure. Testing negative. Was started on OTC medications which she has been taking as directed along with her daily antihistamine.   HPI: HPI  Problems:  Patient Active Problem List   Diagnosis Date Noted   Annual physical exam 09/05/2021   Endometrial polyp 01/08/2018   Postmenopausal bleeding 12/31/2017   B12 deficiency 04/12/2017   Essential hypertension 01/06/2016   Chronic sinusitis 05/02/2015   Abnormal ECG 04/07/2015   Bloodgood disease 04/07/2015   Acid reflux 04/07/2015   Low back pain with sciatica 04/07/2015   Adiposity 04/07/2015   Hemorrhage, postmenopausal 04/07/2015   Pain 04/07/2015   Avitaminosis D 04/07/2015    Allergies:  Allergies  Allergen Reactions   Lisinopril Itching    Mouth Itching   Medications:  Current Outpatient Medications:    predniSONE (DELTASONE) 20 MG tablet, Take 2 tablets (40 mg total) by mouth daily with breakfast.,  Disp: 10 tablet, Rfl: 0   amLODipine (NORVASC) 5 MG tablet, Take 1.5 tablets (7.5 mg total) by mouth daily., Disp: 135 tablet, Rfl: 2   Ascorbic Acid (VITAMIN C) 1000 MG tablet, Take 2,000 mg by mouth daily., Disp: , Rfl:    calcium carbonate (OSCAL) 1500 (600 Ca) MG TABS tablet, Take by mouth., Disp: , Rfl:    cetirizine (ZYRTEC) 10 MG tablet, TAKE 1 TABLET(10 MG) BY MOUTH DAILY, Disp: 90 tablet,  Rfl: 1   Cholecalciferol (VITAMIN D-3) 5000 units TABS, Take 5,000 Units by mouth daily., Disp: , Rfl:    fluticasone (FLONASE) 50 MCG/ACT nasal spray, Place 2 sprays into both nostrils daily., Disp: 16 g, Rfl: 11   furosemide (LASIX) 20 MG tablet, TAKE 1 TABLET BY MOUTH DAILY AS  NEEDED FOR FLUID RETENTION, Disp: 90 tablet, Rfl: 3   ibuprofen (ADVIL) 800 MG tablet, Take 1 tablet (800 mg total) by mouth every 8 (eight) hours as needed. for pain, Disp: 270 tablet, Rfl: 1  Observations/Objective: Patient is well-developed, well-nourished in no acute distress.  Resting comfortably at home.  Head is normocephalic, atraumatic.  No labored breathing. Speech is clear and coherent with logical content.  Patient is alert and oriented at baseline.   Assessment and Plan: 1. Acute viral sinusitis - predniSONE (DELTASONE) 20 MG tablet; Take 2 tablets (40 mg total) by mouth daily with breakfast.  Dispense: 10 tablet; Refill: 0  Will add prednisone burst to current regimen. Again reviewed supportive measures and OTC medications. Continue allergy regimen. In-person follow-up if not resolving.  Follow Up Instructions: I discussed the assessment and treatment plan with the patient. The patient was provided an opportunity to ask questions and all were answered. The patient agreed with the plan and demonstrated an understanding of the instructions.  A copy of instructions were sent to the patient via MyChart unless otherwise noted below.   The patient was advised to call back or seek an in-person evaluation if the symptoms worsen or if the condition fails to improve as anticipated.  Time:  I spent 10 minutes with the patient via telehealth technology discussing the above problems/concerns.    Leeanne Rio, PA-C

## 2022-08-30 NOTE — Patient Instructions (Signed)
Ashley Ballard, thank you for joining [name], PA-C for today's virtual visit.  While this provider is not your primary care provider (PCP), if your PCP is located in our provider database this encounter information will be shared with them immediately following your visit.   A [facility] MyChart account gives you access to today's visit and all your visits, tests, and labs performed at [facility] " click here if you don't have a [facility] MyChart account or go to [email or web address]  Consent: (Patient) Ashley Ballard provided verbal consent for this virtual visit at the beginning of the encounter.  Current Medications:  Current Outpatient Medications:    amLODipine (NORVASC) 5 MG tablet, Take 1.5 tablets (7.5 mg total) by mouth daily., Disp: 135 tablet, Rfl: 2   Ascorbic Acid (VITAMIN C) 1000 MG tablet, Take 2,000 mg by mouth daily., Disp: , Rfl:    calcium carbonate (OSCAL) 1500 (600 Ca) MG TABS tablet, Take by mouth., Disp: , Rfl:    cetirizine (ZYRTEC) 10 MG tablet, TAKE 1 TABLET(10 MG) BY MOUTH DAILY, Disp: 90 tablet, Rfl: 1   Cholecalciferol (VITAMIN D-3) 5000 units TABS, Take 5,000 Units by mouth daily., Disp: , Rfl:    fluticasone (FLONASE) 50 MCG/ACT nasal spray, Place 2 sprays into both nostrils daily., Disp: 16 g, Rfl: 11   furosemide (LASIX) 20 MG tablet, TAKE 1 TABLET BY MOUTH DAILY AS  NEEDED FOR FLUID RETENTION, Disp: 90 tablet, Rfl: 3   ibuprofen (ADVIL) 800 MG tablet, Take 1 tablet (800 mg total) by mouth every 8 (eight) hours as needed. for pain, Disp: 270 tablet, Rfl: 1   Medications ordered in this encounter:  No orders of the defined types were placed in this encounter.    *If you need refills on other medications prior to your next appointment, please contact your pharmacy*  Follow-Up: Call back or seek an in-person evaluation if the symptoms worsen or if the condition fails to improve as anticipated.  [facility]  [phone]  Other Instructions  We are sorry that you are not feeling well.  Here is how we plan to help!  Based on what you have shared with me it looks like you have sinusitis.  Sinusitis is inflammation and infection in the sinus cavities of the head.  Based on your presentation I believe you most likely have Acute Viral Sinusitis.This is an infection most likely caused by a virus. There is not specific treatment for viral sinusitis other than to help you with the symptoms until the infection runs its course.  You may use an oral decongestant such as Mucinex D or if you have glaucoma or high blood pressure use plain Mucinex. Saline nasal spray help and can safely be used as often as needed for congestion, I have prescribed a prednisone burst as discussed, to take as directed. Continue your allergy medications along with this.  Some authorities believe that zinc sprays or the use of Echinacea may shorten the course of your symptoms.  Sinus infections are not as easily transmitted as other respiratory infection, however we still recommend that you avoid close contact with loved ones, especially the very young and elderly.  Remember to wash your hands thoroughly throughout the day as this is the number one way to prevent the spread of infection!  Home Care: Only take medications as instructed by your medical team. Do not take these medications with alcohol. A steam or ultrasonic humidifier can help congestion.  You can place a towel over your head and breathe in the steam from hot water coming from a faucet. Avoid close contacts especially the very young and the elderly. Cover your mouth when you cough or sneeze. Always remember to wash your hands.  Get Help Right Away If: You develop worsening fever or sinus pain. You develop a severe head ache or visual changes. Your symptoms persist after you have completed your treatment plan.  Make sure you Understand these instructions. Will  watch your condition. Will get help right away if you are not doing well or get worse.      If you have been instructed to have an in-person evaluation today at a local Urgent Care facility, please use the link below. It will take you to a list of all of our available [facility], including address, phone number and hours of operation. Please do not delay care.  [facility]  If you or a family member do not have a primary care provider, use the link below to schedule a visit and establish care. When you choose a [facility] primary care physician or advanced practice provider, you gain a long-term partner in health. Find a Primary Care Provider  Learn more about 's in-office and virtual care options: [facility]

## 2022-09-06 ENCOUNTER — Ambulatory Visit: Payer: Managed Care, Other (non HMO) | Admitting: Physician Assistant

## 2022-10-13 ENCOUNTER — Encounter: Payer: Self-pay | Admitting: Emergency Medicine

## 2022-10-13 ENCOUNTER — Ambulatory Visit
Admission: EM | Admit: 2022-10-13 | Discharge: 2022-10-13 | Disposition: A | Discharge status: Home / Self Care | Payer: Managed Care, Other (non HMO) | Attending: Physician Assistant | Admitting: Physician Assistant

## 2022-10-13 DIAGNOSIS — J029 Acute pharyngitis, unspecified: Secondary | ICD-10-CM | POA: Diagnosis not present

## 2022-10-13 DIAGNOSIS — R0982 Postnasal drip: Secondary | ICD-10-CM

## 2022-10-13 LAB — GROUP A STREP BY PCR: Group A Strep by PCR: NOT DETECTED

## 2022-10-13 NOTE — ED Provider Notes (Signed)
[facility]    CSN: [id number] Arrival date & time: 10/13/22  1512      History   Chief Complaint Chief Complaint  Patient presents with   Sore Throat    HPI Ashley Ballard is a 56 y.o. female presenting for sore throat that began this morning.  She reports she thinks is due to postnasal drainage as she has chronic allergies year-round but she states that she was exposed to strep through her granddaughter would like a strep test.  She has not a fever, cough or congestion.  No other symptoms.  Takes over-the-counter antihistamines regularly.  HPI  Past Medical History:  Diagnosis Date   Family history of adverse reaction to anesthesia    mother had problems   GERD (gastroesophageal reflux disease)    Hypertension     Patient Active Problem List   Diagnosis Date Noted   Annual physical exam 09/05/2021   Endometrial polyp 01/08/2018   Postmenopausal bleeding 12/31/2017   B12 deficiency 04/12/2017   Essential hypertension 01/06/2016   Chronic sinusitis 05/02/2015   Abnormal ECG 04/07/2015   Bloodgood disease 04/07/2015   Acid reflux 04/07/2015   Low back pain with sciatica 04/07/2015   Adiposity 04/07/2015   Hemorrhage, postmenopausal 04/07/2015   Pain 04/07/2015   Avitaminosis D 04/07/2015    Past Surgical History:  Procedure Laterality Date   BIOPSY ENDOMETRIAL  01/21/2014   BREAST CYST EXCISION Left 1983   DILATATION & CURETTAGE/HYSTEROSCOPY WITH MYOSURE N/A 03/04/2018   Procedure: DILATATION & CURETTAGE/HYSTEROSCOPY;  Surgeon: Conard Novak, MD;  Location: [facility] OR[facility];  Service: Gynecology;  Laterality: N/A;   TUBAL LIGATION      OB History     Gravida  2   Para  2   Term  2   Preterm      AB      Living  2      SAB      IAB      Ectopic      Multiple      Live Births  2            Home Medications    Prior to Admission medications   Medication Sig Start Date End Date Taking? Authorizing Provider  amLODipine  (NORVASC) 5 MG tablet Take 1.5 tablets (7.5 mg total) by mouth daily. 08/02/22   Alfredia Ferguson, PA-C  Ascorbic Acid (VITAMIN C) 1000 MG tablet Take 2,000 mg by mouth daily.    [provider]  calcium carbonate (OSCAL) 1500 (600 Ca) MG TABS tablet Take by mouth. 11/08/11   [provider]  cetirizine (ZYRTEC) 10 MG tablet TAKE 1 TABLET(10 MG) BY MOUTH DAILY 03/06/22   Alfredia Ferguson, PA-C  Cholecalciferol (VITAMIN D-3) 5000 units TABS Take 5,000 Units by mouth daily.    [provider]  fluticasone (FLONASE) 50 MCG/ACT nasal spray Place 2 sprays into both nostrils daily. 09/11/19   Margaretann Loveless, PA-C  furosemide (LASIX) 20 MG tablet TAKE 1 TABLET BY MOUTH DAILY AS  NEEDED FOR FLUID RETENTION 03/20/22   Drubel, Lillia Abed, PA-C  ibuprofen (ADVIL) 800 MG tablet Take 1 tablet (800 mg total) by mouth every 8 (eight) hours as needed. for pain 04/08/20   Margaretann Loveless, PA-C  predniSONE (DELTASONE) 20 MG tablet Take 2 tablets (40 mg total) by mouth daily with breakfast. 08/30/22   Waldon Merl, PA-C    Family History Family History  Problem Relation Age of Onset  Hypertension Mother    Diabetes Mother    Breast cancer Mother 23   Pneumonia Paternal Grandmother    Leukemia Paternal Grandfather    Breast cancer Maternal Grandmother 5   Heart attack Paternal Uncle    Kidney cancer Maternal Grandfather     Social History Social History   Tobacco Use   Smoking status: Never   Smokeless tobacco: Never  Vaping Use   Vaping Use: Never used  Substance Use Topics   Alcohol use: No    Alcohol/week: 0.0 standard drinks of alcohol   Drug use: No     Allergies   Lisinopril   Review of Systems Review of Systems  Constitutional:  Negative for chills, diaphoresis, fatigue and fever.  HENT:  Positive for postnasal drip and sore throat. Negative for congestion, ear pain, rhinorrhea, sinus pressure and sinus pain.   Respiratory:  Negative for cough and  shortness of breath.   Gastrointestinal:  Negative for abdominal pain, nausea and vomiting.  Musculoskeletal:  Negative for arthralgias and myalgias.  Skin:  Negative for rash.  Neurological:  Negative for weakness and headaches.  Hematological:  Negative for adenopathy.     Physical Exam Triage Vital Signs ED Triage Vitals  Enc Vitals Group     BP 10/13/22 1613 (!) 142/88     Pulse Rate 10/13/22 1613 80     Resp 10/13/22 1613 14     Temp 10/13/22 1613 97.8 F (36.6 C)     Temp Source 10/13/22 1613 Oral     SpO2 10/13/22 1613 98 %     Weight 10/13/22 1611 235 lb (106.6 kg)     Height 10/13/22 1611 5\' 5"  (1.651 m)     Head Circumference --      Peak Flow --      Pain Score 10/13/22 1611 3     Pain Loc --      Pain Edu? --      Excl. in GC? --    No data found.  Updated Vital Signs BP (!) 142/88 (BP Location: Right Arm)   Pulse 80   Temp 97.8 F (36.6 C) (Oral)   Resp 14   Ht 5\' 5"  (1.651 m)   Wt 235 lb (106.6 kg)   LMP 11/19/2015   SpO2 98%   BMI 39.11 kg/m    Physical Exam Vitals and nursing note reviewed.  Constitutional:      General: She is not in acute distress.    Appearance: Normal appearance. She is well-developed. She is not ill-appearing or toxic-appearing.  HENT:     Head: Normocephalic and atraumatic.     Nose: Nose normal.     Mouth/Throat:     Mouth: Mucous membranes are moist.     Pharynx: Oropharynx is clear. Posterior oropharyngeal erythema (mild with clear PND) present.  Eyes:     General: No scleral icterus.       Right eye: No discharge.        Left eye: No discharge.     Conjunctiva/sclera: Conjunctivae normal.  Cardiovascular:     Rate and Rhythm: Normal rate and regular rhythm.     Heart sounds: Normal heart sounds.  Pulmonary:     Effort: Pulmonary effort is normal. No respiratory distress.     Breath sounds: Normal breath sounds.  Musculoskeletal:     Cervical back: Neck supple.  Skin:    General: Skin is dry.   Neurological:     General: No focal deficit  present.     Mental Status: She is alert. Mental status is at baseline.     Motor: No weakness.     Gait: Gait normal.  Psychiatric:        Mood and Affect: Mood normal.        Behavior: Behavior normal.        Thought Content: Thought content normal.      UC Treatments / Results  Labs (all labs ordered are listed, but only abnormal results are displayed) Labs Reviewed  GROUP A STREP BY PCR    EKG   Radiology No results found.  Procedures Procedures (including critical care time)  Medications Ordered in UC Medications - No data to display  Initial Impression / Assessment and Plan / UC Course  I have reviewed the triage vital signs and the nursing notes.  Pertinent labs & imaging results that were available during my care of the patient were reviewed by me and considered in my medical decision making (see chart for details).   56 year old female presents for sore throat and postnasal drainage.  History of year-round allergies but exposure to strep.  She would like a strep test.  On exam she has mild posterior pharyngeal erythema and clear postnasal drainage.  PCR strep is negative.  Advised patient I would contact her with the result if it was positive.  Suggest that she continue antihistamines and Flonase.  The allergy related.  Rest and fluids.  Will treat with antibiotics if strep is positive.  Negative strep test.  Messaged patient on MyChart with the result.   Final Clinical Impressions(s) / UC Diagnoses   Final diagnoses:  Sore throat  Post-nasal drip   Discharge Instructions   None    ED Prescriptions   None    PDMP not reviewed this encounter.   Shirlee Latch, PA-C 10/14/22 [phone]

## 2022-10-13 NOTE — ED Triage Notes (Signed)
Patient c/o sore throat that started this morning.  Patient denies fevers.  Patient states that her granddaughter has strep throat.

## 2022-11-26 NOTE — Progress Notes (Unsigned)
I,[name],acting as a Education administrator for [employer], PA-C.,have documented all relevant documentation on the behalf of Ashley Kirschner, PA-C,as directed by  Ashley Kirschner, PA-C while in the presence of Ashley Kirschner, PA-C.   Complete physical exam   Patient: Ashley Ballard   DOB: 10-May-1966   57 y.o. Female  MRN: [id number] Visit Date: 11/27/2022  Today's healthcare provider: Mikey Kirschner, PA-C   Cc. cpe  Subjective    Ashley Ballard is a 57 y.o. female who presents today for a complete physical exam.  She reports consuming a general diet.  The patient reports trying to exercise at least 45 minutes a day consisting of aerobic tapes during the winter.  She generally feels well. She reports sleeping well. She does not have additional problems to discuss today.  HPI   Past Medical History:  Diagnosis Date   Family history of adverse reaction to anesthesia    mother had problems   GERD (gastroesophageal reflux disease)    Hypertension    Past Surgical History:  Procedure Laterality Date   BIOPSY ENDOMETRIAL  01/21/2014   BREAST CYST EXCISION Left 1983   DILATATION & CURETTAGE/HYSTEROSCOPY WITH MYOSURE N/A 03/04/2018   Procedure: DILATATION & CURETTAGE/HYSTEROSCOPY;  Surgeon: Will Bonnet, MD;  Location: [facility] ORS;  Service: Gynecology;  Laterality: N/A;   TUBAL LIGATION     Social History   Socioeconomic History   Marital status: Married    Spouse name: Legrand Como   Number of children: 2   Years of education: Not on file   Highest education level: Not on file  Occupational History   Occupation: full time  Tobacco Use   Smoking status: Never   Smokeless tobacco: Never  Vaping Use   Vaping Use: Never used  Substance and Sexual Activity   Alcohol use: Never   Drug use: Never   Sexual activity: Yes    Birth control/protection: Post-menopausal  Other Topics Concern   Not on file  Social History Narrative   Not on file   Social Determinants of Health    Financial Resource Strain: Not on file  Food Insecurity: Not on file  Transportation Needs: Not on file  Physical Activity: Not on file  Stress: Not on file  Social Connections: Not on file  Intimate Partner Violence: Not on file   Family Status  Relation Name Status   Mother Markus Jarvis Alive   [place]  Deceased   PGF  Deceased   Sister 1 Alive   Brother 1 Alive   MGM  Deceased   Sister 1 Alive   Psychiatrist  (Not Specified)   MGF  (Not Specified)   Sister 1 Alive   Daughter Donia Guiles (Not Specified)   Family History  Problem Relation Age of Onset   Hypertension Mother    Diabetes Mother    Breast cancer Mother 19   Pneumonia Paternal Grandmother    Leukemia Paternal Grandfather    Breast cancer Maternal Grandmother 85   Heart attack Paternal Uncle    Kidney cancer Maternal Grandfather    Hypertension Daughter    Allergies  Allergen Reactions   Lisinopril Itching    Mouth Itching    Patient Care Team: Ashley Kirschner, PA-C as PCP - General (Physician Assistant)   Medications: Outpatient Medications Prior to Visit  Medication Sig   Ascorbic Acid (VITAMIN C) 1000 MG tablet Take 2,000 mg by mouth daily.   calcium carbonate (OSCAL) 1500 (600 Ca) MG TABS tablet  Take by mouth.   cetirizine (ZYRTEC) 10 MG tablet TAKE 1 TABLET(10 MG) BY MOUTH DAILY   Cholecalciferol (VITAMIN D-3) 5000 units TABS Take 5,000 Units by mouth daily.   fluticasone (FLONASE) 50 MCG/ACT nasal spray Place 2 sprays into both nostrils daily.   furosemide (LASIX) 20 MG tablet TAKE 1 TABLET BY MOUTH DAILY AS  NEEDED FOR FLUID RETENTION   ibuprofen (ADVIL) 800 MG tablet Take 1 tablet (800 mg total) by mouth every 8 (eight) hours as needed. for pain   [DISCONTINUED] amLODipine (NORVASC) 5 MG tablet Take 1.5 tablets (7.5 mg total) by mouth daily.   [DISCONTINUED] predniSONE (DELTASONE) 20 MG tablet Take 2 tablets (40 mg total) by mouth daily with breakfast. (Patient not taking: Reported on 11/27/2022)    No facility-administered medications prior to visit.    Review of Systems  Constitutional:  Negative for fatigue and fever.  Respiratory:  Negative for cough and shortness of breath.   Cardiovascular:  Negative for chest pain and leg swelling.  Gastrointestinal:  Negative for abdominal pain.  Neurological:  Negative for dizziness and headaches.     Objective    BP 137/86 (BP Location: Right Arm, Patient Position: Sitting, Cuff Size: Large)   Pulse 89   Ht 5' 5.5" (1.664 m)   Wt 236 lb (107 kg)   LMP 11/19/2015   SpO2 (!) 89%   BMI 38.68 kg/m   Physical Exam Constitutional:      General: She is awake.     Appearance: She is well-developed. She is not ill-appearing.  HENT:     Head: Normocephalic.     Right Ear: Tympanic membrane normal.     Left Ear: Tympanic membrane normal.     Mouth/Throat:     Pharynx: No oropharyngeal exudate or posterior oropharyngeal erythema.  Eyes:     Conjunctiva/sclera: Conjunctivae normal.     Pupils: Pupils are equal, round, and reactive to light.  Cardiovascular:     Rate and Rhythm: Normal rate and regular rhythm.     Heart sounds: Normal heart sounds.  Pulmonary:     Effort: Pulmonary effort is normal.     Breath sounds: Normal breath sounds.  Abdominal:     Palpations: Abdomen is soft.     Tenderness: There is no abdominal tenderness.  Musculoskeletal:     Right lower leg: No swelling. No edema.     Left lower leg: No swelling. No edema.  Lymphadenopathy:     Cervical: No cervical adenopathy.  Skin:    General: Skin is warm.  Neurological:     Mental Status: She is alert and oriented to person, place, and time.  Psychiatric:        Attention and Perception: Attention normal.        Mood and Affect: Mood normal.        Speech: Speech normal.        Behavior: Behavior normal. Behavior is cooperative.     Last depression screening scores    11/27/2022    8:14 AM 03/06/2022    8:27 AM 09/05/2021   11:08 AM  PHQ 2/9 Scores   PHQ - 2 Score 0 0 0  PHQ- 9 Score 0 0    Last fall risk screening    11/27/2022    8:14 AM  Fall Risk   Number falls in past yr: 0  Injury with Fall? 0  Risk for fall due to : No Fall Risks  Follow up Falls evaluation completed  Last Audit-C alcohol use screening    11/27/2022    8:14 AM  Alcohol Use Disorder Test (AUDIT)  1. How often do you have a drink containing alcohol? 0  2. How many drinks containing alcohol do you have on a typical day when you are drinking? 0  3. How often do you have six or more drinks on one occasion? 0  AUDIT-C Score 0   A score of 3 or more in women, and 4 or more in men indicates increased risk for alcohol abuse, EXCEPT if all of the points are from question 1   No results found for any visits on 11/27/22.  Assessment & Plan    Routine Health Maintenance and Physical Exam  Exercise Activities and Dietary recommendations --balanced diet high in fiber and protein, low in sugars, carbs, fats. --physical activity/exercise 30 minutes 3-5 times a week    Immunization History  Administered Date(s) Administered   Influenza,inj,Quad PF,6+ Mos 10/26/2019   Moderna Sars-Covid-2 Vaccination 12/31/2019, 01/28/2020, 09/01/2020, 05/18/2021, 09/07/2021   Td 12/26/2018   Tdap 10/27/2008    Health Maintenance  Topic Date Due   Zoster Vaccines- Shingrix (1 of 2) Never done   COVID-19 Vaccine (6 - 2023-24 season) 06/22/2022   MAMMOGRAM  10/26/2022   INFLUENZA VACCINE  01/20/2023 (Originally 05/22/2022)   COLONOSCOPY (Pts 45-60yrs Insurance coverage will need to be confirmed)  03/17/2024   PAP SMEAR-Modifier  09/05/2024   DTaP/Tdap/Td (3 - Td or Tdap) 12/25/2028   Hepatitis C Screening  Completed   HIV Screening  Completed   HPV VACCINES  Aged Out    Discussed health benefits of physical activity, and encouraged her to engage in regular exercise appropriate for her age and condition.  Problem List Items Addressed This Visit       Cardiovascular and  Mediastinum   Essential hypertension    Chronic, moderate control. Discussed goal of 120/80. Increasing amlodipine to 10 mg daily. Ordered cmp  F/u 6 mo       Relevant Medications   amLODipine (NORVASC) 10 MG tablet   Other Relevant Orders   Comprehensive Metabolic Panel (CMET)     Other   Arm swelling    Pt manages with lasix 20 mg infrequently. Advised to take as an infrequently as possible.  Watch fluid intake.      RESOLVED: Annual physical exam - Primary   Relevant Orders   Lipid Profile   CBC w/Diff/Platelet   Comprehensive Metabolic Panel (CMET)   Other Visit Diagnoses     Breast cancer screening by mammogram       Relevant Orders   MM 3D SCREEN BREAST BILATERAL      Discussed shingles vaccine, pt prefers to get on a Friday, she will schedule.  Return in about 4 weeks (around 12/25/2022) for hypertension.     I, Ashley Kirschner, PA-C have reviewed all documentation for this visit. The documentation on 11/27/22 for the exam, diagnosis, procedures, and orders are all accurate and complete.  Ashley Kirschner, PA-C [facility] [address] [place], [place], [address] Office: [phone] Fax: [place]

## 2022-11-27 ENCOUNTER — Encounter: Payer: Self-pay | Admitting: Physician Assistant

## 2022-11-27 ENCOUNTER — Ambulatory Visit (INDEPENDENT_AMBULATORY_CARE_PROVIDER_SITE_OTHER): Payer: Managed Care, Other (non HMO) | Admitting: Physician Assistant

## 2022-11-27 VITALS — BP 137/86 | HR 89 | Ht 65.5 in | Wt 236.0 lb

## 2022-11-27 DIAGNOSIS — Z1231 Encounter for screening mammogram for malignant neoplasm of breast: Secondary | ICD-10-CM | POA: Diagnosis not present

## 2022-11-27 DIAGNOSIS — Z0000 Encounter for general adult medical examination without abnormal findings: Secondary | ICD-10-CM | POA: Diagnosis not present

## 2022-11-27 DIAGNOSIS — M7989 Other specified soft tissue disorders: Secondary | ICD-10-CM

## 2022-11-27 DIAGNOSIS — I10 Essential (primary) hypertension: Secondary | ICD-10-CM

## 2022-11-27 MED ORDER — AMLODIPINE BESYLATE 10 MG PO TABS: 10.0000 mg | ORAL_TABLET | Freq: Every day | ORAL | 1 refills | Status: DC

## 2022-11-27 NOTE — Assessment & Plan Note (Addendum)
Chronic, moderate control. Discussed goal of 120/80. Increasing amlodipine to 10 mg daily. Ordered cmp  F/u 6 mo

## 2022-11-27 NOTE — Assessment & Plan Note (Signed)
Pt manages with lasix 20 mg infrequently. Advised to take as an infrequently as possible.  Watch fluid intake.

## 2022-11-28 LAB — COMPREHENSIVE METABOLIC PANEL
ALT: 25 IU/L (ref 0–32)
AST: 18 IU/L (ref 0–40)
Albumin/Globulin Ratio: 2.1 (ref 1.2–2.2)
Albumin: 4.7 g/dL (ref 3.8–4.9)
Alkaline Phosphatase: 103 IU/L (ref 44–121)
BUN/Creatinine Ratio: 14 (ref 9–23)
BUN: 11 mg/dL (ref 6–24)
Bilirubin Total: 1.1 mg/dL (ref 0.0–1.2)
CO2: 21 mmol/L (ref 20–29)
Calcium: 9.3 mg/dL (ref 8.7–10.2)
Chloride: 104 mmol/L (ref 96–106)
Creatinine, Ser: 0.76 mg/dL (ref 0.57–1.00)
Globulin, Total: 2.2 g/dL (ref 1.5–4.5)
Glucose: 93 mg/dL (ref 70–99)
Potassium: 4.7 mmol/L (ref 3.5–5.2)
Sodium: 140 mmol/L (ref 134–144)
Total Protein: 6.9 g/dL (ref 6.0–8.5)
eGFR: 91 mL/min/{1.73_m2} (ref >59)

## 2022-11-28 LAB — LIPID PANEL
Chol/HDL Ratio: 3.4 ratio (ref 0.0–4.4)
Cholesterol, Total: 147 mg/dL (ref 100–199)
HDL: 43 mg/dL (ref >39)
LDL Chol Calc (NIH): 87 mg/dL (ref 0–99)
Triglycerides: 92 mg/dL (ref 0–149)
VLDL Cholesterol Cal: 17 mg/dL (ref 5–40)

## 2022-11-28 LAB — CBC WITH DIFFERENTIAL/PLATELET
Basophils Absolute: 0 10*3/uL (ref 0.0–0.2)
Basos: 0 %
EOS (ABSOLUTE): 0.1 10*3/uL (ref 0.0–0.4)
Eos: 2 %
Hematocrit: 39.5 % (ref 34.0–46.6)
Hemoglobin: 12.8 g/dL (ref 11.1–15.9)
Immature Grans (Abs): 0 10*3/uL (ref 0.0–0.1)
Immature Granulocytes: 0 %
Lymphocytes Absolute: 1.7 10*3/uL (ref 0.7–3.1)
Lymphs: 35 %
MCH: 30.3 pg (ref 26.6–33.0)
MCHC: 32.4 g/dL (ref 31.5–35.7)
MCV: 94 fL (ref 79–97)
Monocytes Absolute: 0.4 10*3/uL (ref 0.1–0.9)
Monocytes: 9 %
Neutrophils Absolute: 2.6 10*3/uL (ref 1.4–7.0)
Neutrophils: 54 %
Platelets: 203 10*3/uL (ref 150–450)
RBC: 4.22 x10E6/uL (ref 3.77–5.28)
RDW: 11.9 % (ref 11.7–15.4)
WBC: 4.8 10*3/uL (ref 3.4–10.8)

## 2022-12-04 ENCOUNTER — Other Ambulatory Visit: Payer: Self-pay | Admitting: Physician Assistant

## 2022-12-04 DIAGNOSIS — J301 Allergic rhinitis due to pollen: Secondary | ICD-10-CM

## 2022-12-14 ENCOUNTER — Ambulatory Visit
Admission: RE | Admit: 2022-12-14 | Discharge: 2022-12-14 | Disposition: A | Discharge status: Home / Self Care | Payer: Managed Care, Other (non HMO) | Source: Ambulatory Visit | Attending: Physician Assistant | Admitting: Physician Assistant

## 2022-12-14 DIAGNOSIS — Z1231 Encounter for screening mammogram for malignant neoplasm of breast: Secondary | ICD-10-CM | POA: Diagnosis not present

## 2022-12-28 ENCOUNTER — Encounter: Payer: Self-pay | Admitting: Physician Assistant

## 2022-12-28 ENCOUNTER — Ambulatory Visit: Payer: Managed Care, Other (non HMO) | Admitting: Physician Assistant

## 2022-12-28 VITALS — BP 119/76 | HR 83 | Wt 237.1 lb

## 2022-12-28 DIAGNOSIS — I10 Essential (primary) hypertension: Secondary | ICD-10-CM | POA: Diagnosis not present

## 2022-12-28 NOTE — Progress Notes (Signed)
I,[name],acting as a Education administrator for [employer], PA-C.,have documented all relevant documentation on the behalf of Mikey Kirschner, PA-C,as directed by  Mikey Kirschner, PA-C while in the presence of Mikey Kirschner, PA-C.   Established patient visit   Patient: Ashley Ballard   DOB: 1966-05-29   57 y.o. Female  MRN: [id number] Visit Date: 12/28/2022  Today's healthcare provider: Mikey Kirschner, PA-C   Cc. Htn f/u  Subjective    HPI  Hypertension, follow-up  BP Readings from Last 3 Encounters:  11/27/22 137/86  10/13/22 (!) 142/88  08/24/22 138/88   Wt Readings from Last 3 Encounters:  11/27/22 236 lb (107 kg)  10/13/22 235 lb (106.6 kg)  08/24/22 235 lb (106.6 kg)     She was last seen for hypertension 4 weeks ago.  BP at that visit was 137/86. Management since that visit includes increase amlodipine to 10 mg daily.  Outside blood pressures are 110s/70s.  Denies side effects. Reports no symptoms  Medications: Outpatient Medications Prior to Visit  Medication Sig   amLODipine (NORVASC) 10 MG tablet Take 1 tablet (10 mg total) by mouth daily.   Ascorbic Acid (VITAMIN C) 1000 MG tablet Take 2,000 mg by mouth daily.   calcium carbonate (OSCAL) 1500 (600 Ca) MG TABS tablet Take by mouth.   cetirizine (ZYRTEC) 10 MG tablet TAKE 1 TABLET(10 MG) BY MOUTH DAILY   Cholecalciferol (VITAMIN D-3) 5000 units TABS Take 5,000 Units by mouth daily.   fluticasone (FLONASE) 50 MCG/ACT nasal spray Place 2 sprays into both nostrils daily.   furosemide (LASIX) 20 MG tablet TAKE 1 TABLET BY MOUTH DAILY AS  NEEDED FOR FLUID RETENTION   ibuprofen (ADVIL) 800 MG tablet Take 1 tablet (800 mg total) by mouth every 8 (eight) hours as needed. for pain   No facility-administered medications prior to visit.    Review of Systems  Constitutional:  Negative for fatigue and fever.  Respiratory:  Negative for cough and shortness of breath.   Cardiovascular:  Negative for chest pain and leg  swelling.  Gastrointestinal:  Negative for abdominal pain.  Neurological:  Negative for dizziness and headaches.      Objective    LMP 11/19/2015  Blood pressure 119/76, pulse 83, weight 237 lb 1.6 oz (107.5 kg), last menstrual period 11/19/2015, SpO2 100 %.   Physical Exam Constitutional:      General: She is awake.     Appearance: She is well-developed.  HENT:     Head: Normocephalic.  Eyes:     Conjunctiva/sclera: Conjunctivae normal.  Cardiovascular:     Rate and Rhythm: Normal rate and regular rhythm.     Heart sounds: Normal heart sounds.  Pulmonary:     Effort: Pulmonary effort is normal.  Musculoskeletal:     Right lower leg: No edema.     Left lower leg: No edema.  Skin:    General: Skin is warm.  Neurological:     Mental Status: She is alert and oriented to person, place, and time.  Psychiatric:        Attention and Perception: Attention normal.        Mood and Affect: Mood normal.        Speech: Speech normal.        Behavior: Behavior is cooperative.      No results found for any visits on 12/28/22.  Assessment & Plan     Problem List Items Addressed This Visit  Cardiovascular and Mediastinum   Essential hypertension - Primary    Last visit had increased amlodipine to 10 mg  Home values in range Continue medications F/u 6 mo       Return in about 6 months (around 06/30/2023) for chronic conditions.      I, Mikey Kirschner, PA-C have reviewed all documentation for this visit. The documentation on 12/28/22 for the exam, diagnosis, procedures, and orders are all accurate and complete.  Mikey Kirschner, PA-C [facility] [address] [place], [place], [address] Office: [phone] Fax: [place]

## 2022-12-28 NOTE — Assessment & Plan Note (Addendum)
Last visit had increased amlodipine to 10 mg  Home values in range Continue medications F/u 6 mo

## 2023-03-20 ENCOUNTER — Encounter: Payer: Self-pay | Admitting: Physician Assistant

## 2023-03-21 ENCOUNTER — Other Ambulatory Visit: Payer: Self-pay

## 2023-03-21 DIAGNOSIS — J328 Other chronic sinusitis: Secondary | ICD-10-CM

## 2023-03-21 MED ORDER — FLUTICASONE PROPIONATE 50 MCG/ACT NA SUSP: 2.0000 | Freq: Every day | NASAL | 11 refills | Status: DC

## 2023-04-16 ENCOUNTER — Other Ambulatory Visit: Payer: Self-pay | Admitting: Physician Assistant

## 2023-04-16 DIAGNOSIS — I10 Essential (primary) hypertension: Secondary | ICD-10-CM

## 2023-04-17 NOTE — Telephone Encounter (Signed)
Requested Prescriptions  Pending Prescriptions Disp Refills   amLODipine (NORVASC) 10 MG tablet [Pharmacy Med Name: amLODIPine Besylate 10 MG Oral Tablet] 90 tablet 0    Sig: TAKE 1 TABLET BY MOUTH DAILY     Cardiovascular: Calcium Channel Blockers 2 Passed - 04/16/2023 10:08 PM      Passed - Last BP in normal range    BP Readings from Last 1 Encounters:  12/28/22 119/76         Passed - Last Heart Rate in normal range    Pulse Readings from Last 1 Encounters:  12/28/22 83         Passed - Valid encounter within last 6 months    Recent Outpatient Visits           3 months ago Essential hypertension   [facility] Alfredia Ferguson, PA-C   4 months ago Annual physical exam   [facility] Alfredia Ferguson, PA-C   8 months ago Essential hypertension   [place] [facility] Alfredia Ferguson, PA-C   9 months ago Primary hypertension   [place] [facility] [place], [place], [place]   1 year ago Essential hypertension   [place] [facility] Alfredia Ferguson, PA-C       Future Appointments             In 2 months Ok Edwards, [facility], [facility]

## 2023-05-02 ENCOUNTER — Encounter: Payer: Self-pay | Admitting: Physician Assistant

## 2023-05-30 ENCOUNTER — Encounter: Payer: Self-pay | Admitting: Family Medicine

## 2023-06-26 ENCOUNTER — Other Ambulatory Visit: Payer: Self-pay | Admitting: Family Medicine

## 2023-06-26 ENCOUNTER — Telehealth: Payer: Self-pay | Admitting: Family Medicine

## 2023-06-26 DIAGNOSIS — I10 Essential (primary) hypertension: Secondary | ICD-10-CM

## 2023-06-26 MED ORDER — AMLODIPINE BESYLATE 10 MG PO TABS: 10.0000 mg | ORAL_TABLET | Freq: Every day | ORAL | 1 refills | Status: DC

## 2023-06-26 MED ORDER — AMLODIPINE BESYLATE 10 MG PO TABS: 10.0000 mg | ORAL_TABLET | Freq: Every day | ORAL | 0 refills | Status: DC

## 2023-06-26 NOTE — Telephone Encounter (Signed)
[facility] faxed refill request for the following medications:   amLODipine (NORVASC) 10 MG tablet   Please advise.

## 2023-06-28 ENCOUNTER — Ambulatory Visit: Payer: Managed Care, Other (non HMO) | Admitting: Family Medicine

## 2023-06-28 ENCOUNTER — Encounter: Payer: Self-pay | Admitting: Family Medicine

## 2023-06-28 VITALS — BP 135/79 | HR 80 | Temp 98.2°F | Ht 65.5 in | Wt 235.0 lb

## 2023-06-28 DIAGNOSIS — I10 Essential (primary) hypertension: Secondary | ICD-10-CM | POA: Diagnosis not present

## 2023-06-28 DIAGNOSIS — Z23 Encounter for immunization: Secondary | ICD-10-CM | POA: Diagnosis not present

## 2023-06-28 NOTE — Progress Notes (Signed)
Established Office Visit  Subjective:     Patient ID: Ashley Ballard, female    DOB: 1966-05-12, 57 y.o.   MRN: [id number]  No chief complaint on file.   HPI Discussed the use of AI scribe software for clinical note transcription with the patient, who gave verbal consent to proceed.  History of Present Illness   The patient, with a history of hypertension, presents for a routine follow-up after six months. They are currently on amlodipine 10 mg daily for blood pressure control and furosemide as needed for swelling. They report that their home blood pressure readings have been satisfactory and deny any issues with their current medication regimen.  In addition, the patient expresses interest in receiving the shingles vaccine. They have seen advertisements for the vaccine and know a few people who have had shingles, but do not have any immediate family members who have had the condition. They have not had any issues with previous vaccinations, including five COVID-19 shots.       ROS per HPI     Objective:    BP 135/79 (BP Location: Left Arm, Patient Position: Sitting, Cuff Size: Large)   Pulse 80   Temp 98.2 F (36.8 C) (Oral)   Ht 5' 5.5" (1.664 m)   Wt 235 lb (106.6 kg)   LMP 11/19/2015   SpO2 100%   BMI 38.51 kg/m    Physical Exam Vitals reviewed.  Constitutional:      General: She is not in acute distress.    Appearance: Normal appearance. She is well-developed. She is not diaphoretic.  HENT:     Head: Normocephalic and atraumatic.  Eyes:     General: No scleral icterus.    Conjunctiva/sclera: Conjunctivae normal.  Neck:     Thyroid: No thyromegaly.  Cardiovascular:     Rate and Rhythm: Normal rate and regular rhythm.     Pulses: Normal pulses.     Heart sounds: Normal heart sounds. No murmur heard. Pulmonary:     Effort: Pulmonary effort is normal. No respiratory distress.     Breath sounds: Normal breath sounds. No wheezing, rhonchi or rales.   Musculoskeletal:     Cervical back: Neck supple.     Right lower leg: No edema.     Left lower leg: No edema.  Lymphadenopathy:     Cervical: No cervical adenopathy.  Skin:    General: Skin is warm and dry.     Findings: No rash.  Neurological:     Mental Status: She is alert and oriented to person, place, and time. Mental status is at baseline.  Psychiatric:        Mood and Affect: Mood normal.        Behavior: Behavior normal.     No results found for any visits on 06/28/23.      Assessment & Plan:   Problem List Items Addressed This Visit       Cardiovascular and Mediastinum   Essential hypertension - Primary    Well controlled on Amlodipine 10mg  daily. Home readings are consistent and within normal range. No reported side effects. -Continue Amlodipine 10mg  daily. -Check labs today.      Other Visit Diagnoses     Need for shingles vaccine       Relevant Orders   Zoster Recombinant (Shingrix ) (Completed)           General Health Maintenance Discussed the benefits and potential side effects of the shingles vaccine. Patient has agreed  to start the series today. -Administer first dose of shingles vaccine today. -Plan for second dose at physical in February 2025. -Schedule physical for February 2025.        No orders of the defined types were placed in this encounter.   Return in about 5 months (around 11/28/2023) for CPE, shingrix #2.  Shirlee Latch, MD

## 2023-06-28 NOTE — Assessment & Plan Note (Signed)
Well controlled on Amlodipine 10mg  daily. Home readings are consistent and within normal range. No reported side effects. -Continue Amlodipine 10mg  daily. -Check labs today.

## 2023-07-01 ENCOUNTER — Ambulatory Visit: Payer: Managed Care, Other (non HMO) | Admitting: Physician Assistant

## 2023-11-18 ENCOUNTER — Telehealth: Payer: Self-pay | Admitting: Family Medicine

## 2023-11-18 ENCOUNTER — Other Ambulatory Visit: Payer: Self-pay | Admitting: *Deleted

## 2023-11-18 DIAGNOSIS — I10 Essential (primary) hypertension: Secondary | ICD-10-CM

## 2023-11-18 MED ORDER — AMLODIPINE BESYLATE 10 MG PO TABS: 10.0000 mg | ORAL_TABLET | Freq: Every day | ORAL | 1 refills | Status: DC

## 2023-11-18 NOTE — Telephone Encounter (Signed)
Medication sent to the pharmacy.

## 2023-11-18 NOTE — Telephone Encounter (Signed)
[facility] -1st attempt-pharmacy faxed refill request for the following medications:    amLODipine (NORVASC) 10 MG tablet    Please advise

## 2023-11-20 ENCOUNTER — Other Ambulatory Visit: Payer: Self-pay

## 2023-11-20 DIAGNOSIS — I10 Essential (primary) hypertension: Secondary | ICD-10-CM

## 2023-11-20 MED ORDER — AMLODIPINE BESYLATE 10 MG PO TABS: 10.0000 mg | ORAL_TABLET | Freq: Every day | ORAL | 1 refills | Status: DC

## 2023-11-20 NOTE — Telephone Encounter (Signed)
Received another request for this medication.  Looks like this medication was sent to [facility].  Was supposed to got to [facility].  Can we sent to correct pharmacy please.

## 2023-11-20 NOTE — Telephone Encounter (Signed)
Rx updated   E-Prescribing Status: Receipt confirmed by pharmacy (11/20/2023 10:50 AM EST)

## 2023-11-21 ENCOUNTER — Encounter: Payer: Self-pay | Admitting: Family Medicine

## 2023-11-26 ENCOUNTER — Other Ambulatory Visit: Payer: Self-pay | Admitting: Physician Assistant

## 2023-11-26 DIAGNOSIS — J301 Allergic rhinitis due to pollen: Secondary | ICD-10-CM

## 2023-11-29 ENCOUNTER — Ambulatory Visit (INDEPENDENT_AMBULATORY_CARE_PROVIDER_SITE_OTHER): Payer: Managed Care, Other (non HMO) | Admitting: Family Medicine

## 2023-11-29 ENCOUNTER — Encounter: Payer: Self-pay | Admitting: Family Medicine

## 2023-11-29 VITALS — BP 150/83 | HR 92 | Wt 242.9 lb

## 2023-11-29 DIAGNOSIS — E559 Vitamin D deficiency, unspecified: Secondary | ICD-10-CM | POA: Diagnosis not present

## 2023-11-29 DIAGNOSIS — Z1211 Encounter for screening for malignant neoplasm of colon: Secondary | ICD-10-CM

## 2023-11-29 DIAGNOSIS — Z0001 Encounter for general adult medical examination with abnormal findings: Secondary | ICD-10-CM | POA: Diagnosis not present

## 2023-11-29 DIAGNOSIS — I10 Essential (primary) hypertension: Secondary | ICD-10-CM | POA: Diagnosis not present

## 2023-11-29 DIAGNOSIS — J301 Allergic rhinitis due to pollen: Secondary | ICD-10-CM

## 2023-11-29 DIAGNOSIS — Z0000 Encounter for general adult medical examination without abnormal findings: Secondary | ICD-10-CM

## 2023-11-29 DIAGNOSIS — Z1231 Encounter for screening mammogram for malignant neoplasm of breast: Secondary | ICD-10-CM

## 2023-11-29 DIAGNOSIS — Z23 Encounter for immunization: Secondary | ICD-10-CM | POA: Diagnosis not present

## 2023-11-29 DIAGNOSIS — R600 Localized edema: Secondary | ICD-10-CM

## 2023-11-29 MED ORDER — FUROSEMIDE 20 MG PO TABS: 20.0000 mg | ORAL_TABLET | Freq: Every day | ORAL | 3 refills | Status: DC

## 2023-11-29 MED ORDER — POTASSIUM CHLORIDE CRYS ER 20 MEQ PO TBCR: 20.0000 meq | EXTENDED_RELEASE_TABLET | Freq: Every day | ORAL | 3 refills | Status: DC

## 2023-11-29 NOTE — Assessment & Plan Note (Signed)
 Hypertension with recent elevated readings. Currently on amlodipine  10 mg daily and furosemide  as needed. Reports variable home readings and recent weight gain. Hydrochlorothiazide  previously caused adverse reactions (petechiae, peeling skin). Plan to use furosemide  daily with potassium supplementation. Discussed benefits of 5-10% weight reduction for blood pressure and overall health. Prefers lifestyle changes over weight loss medications. - Prescribe furosemide  20 mg daily (was taking prn) - Prescribe potassium supplement daily - Recheck blood pressure in 3 months - Order labs in 2 weeks to check potassium levels

## 2023-11-29 NOTE — Progress Notes (Signed)
 Complete physical exam   Patient: Ashley Ballard   DOB: 08/07/1966   58 y.o. Female  MRN: [id number] Visit Date: 11/29/2023  Today's healthcare provider: Jon Eva, MD   Chief Complaint  Patient presents with   Annual Exam    Last completed 11/27/22 Diet - General, some days better than others Exercise - 5 days a week for 30 minutes Feeling - Well Sleeping - Well Concerns - None   Immunizations    Shingles - Yes, first injection administered 06/28/2023   Subjective    Ashley Ballard is a 58 y.o. female who presents today for a complete physical exam.   Discussed the use of AI scribe software for clinical note transcription with the patient, who gave verbal consent to proceed.  History of Present Illness   The patient, with a history of hypertension and allergies, presents for a routine physical examination. The patient reports no specific concerns or new symptoms. The patient acknowledges a recent weight gain and is aware of the potential impact on blood pressure. The patient is currently on amlodipine  for blood pressure, Lasix  as needed, Flonase  nasal spray and Zyrtec  for allergies, and vitamin supplements. The patient expresses a willingness to make lifestyle changes, including increased physical activity and weight loss, to better manage blood pressure. The patient's blood pressure reading during the visit is elevated, despite current medication.        Last depression screening scores    06/28/2023    9:09 AM 12/28/2022    8:15 AM 11/27/2022    8:14 AM  PHQ 2/9 Scores  PHQ - 2 Score 0 0 0  PHQ- 9 Score 0 0 0   Last fall risk screening    06/28/2023    9:09 AM  Fall Risk   Falls in the past year? 0  Number falls in past yr: 0  Injury with Fall? 0        Medications: Outpatient Medications Prior to Visit  Medication Sig   amLODipine  (NORVASC ) 10 MG tablet Take 1 tablet (10 mg total) by mouth daily. OV due in Spring 2025   Ascorbic Acid (VITAMIN C)  1000 MG tablet Take 2,000 mg by mouth daily.   calcium  carbonate (OSCAL) 1500 (600 Ca) MG TABS tablet Take by mouth.   cetirizine  (ZYRTEC ) 10 MG tablet TAKE 1 TABLET(10 MG) BY MOUTH DAILY   Cholecalciferol (VITAMIN D -3) 5000 units TABS Take 5,000 Units by mouth daily.   fluticasone  (FLONASE ) 50 MCG/ACT nasal spray Place 2 sprays into both nostrils daily.   ibuprofen  (ADVIL ) 800 MG tablet Take 1 tablet (800 mg total) by mouth every 8 (eight) hours as needed. for pain   [DISCONTINUED] furosemide  (LASIX ) 20 MG tablet TAKE 1 TABLET BY MOUTH DAILY AS  NEEDED FOR FLUID RETENTION   No facility-administered medications prior to visit.    Review of Systems    Objective    BP (!) 150/83 (BP Location: Left Arm, Patient Position: Sitting, Cuff Size: Large)   Pulse 92   Wt 242 lb 14.4 oz (110.2 kg)   LMP 11/19/2015   SpO2 100%   BMI 39.81 kg/m    Physical Exam Vitals reviewed.  Constitutional:      General: She is not in acute distress.    Appearance: Normal appearance. She is well-developed. She is not diaphoretic.  HENT:     Head: Normocephalic and atraumatic.     Right Ear: Tympanic membrane, ear canal and external ear normal.  Left Ear: Tympanic membrane, ear canal and external ear normal.     Nose: Nose normal.     Mouth/Throat:     Mouth: Mucous membranes are moist.     Pharynx: Oropharynx is clear. No oropharyngeal exudate.  Eyes:     General: No scleral icterus.    Conjunctiva/sclera: Conjunctivae normal.     Pupils: Pupils are equal, round, and reactive to light.  Neck:     Thyroid: No thyromegaly.  Cardiovascular:     Rate and Rhythm: Normal rate and regular rhythm.     Heart sounds: Normal heart sounds. No murmur heard. Pulmonary:     Effort: Pulmonary effort is normal. No respiratory distress.     Breath sounds: Normal breath sounds. No wheezing or rales.  Abdominal:     General: There is no distension.     Palpations: Abdomen is soft.     Tenderness: There is  no abdominal tenderness.  Musculoskeletal:        General: No deformity.     Cervical back: Neck supple.     Right lower leg: No edema.     Left lower leg: No edema.  Lymphadenopathy:     Cervical: No cervical adenopathy.  Skin:    General: Skin is warm and dry.     Findings: No rash.  Neurological:     Mental Status: She is alert and oriented to person, place, and time. Mental status is at baseline.     Gait: Gait normal.  Psychiatric:        Mood and Affect: Mood normal.        Behavior: Behavior normal.        Thought Content: Thought content normal.      No results found for any visits on 11/29/23.  Assessment & Plan    Routine Health Maintenance and Physical Exam  Exercise Activities and Dietary recommendations  Goals   None     Immunization History  Administered Date(s) Administered   Influenza,inj,Quad PF,6+ Mos 10/26/2019   Moderna Sars-Covid-2 Vaccination 12/31/2019, 01/28/2020, 09/01/2020, 05/18/2021, 09/07/2021   Td 12/26/2018   Tdap 10/27/2008   Zoster Recombinant(Shingrix ) 06/28/2023, 11/29/2023    Health Maintenance  Topic Date Due   COVID-19 Vaccine (6 - 2024-25 season) 06/23/2023   Colonoscopy  03/17/2024   INFLUENZA VACCINE  01/20/2024 (Originally 05/23/2023)   MAMMOGRAM  12/15/2023   Cervical Cancer Screening (HPV/Pap Cotest)  09/05/2024   DTaP/Tdap/Td (3 - Td or Tdap) 12/25/2028   Hepatitis C Screening  Completed   HIV Screening  Completed   Zoster Vaccines- Shingrix   Completed   HPV VACCINES  Aged Out    Discussed health benefits of physical activity, and encouraged her to engage in regular exercise appropriate for her age and condition.  Problem List Items Addressed This Visit       Cardiovascular and Mediastinum   Essential hypertension   Hypertension with recent elevated readings. Currently on amlodipine  10 mg daily and furosemide  as needed. Reports variable home readings and recent weight gain. Hydrochlorothiazide  previously caused  adverse reactions (petechiae, peeling skin). Plan to use furosemide  daily with potassium supplementation. Discussed benefits of 5-10% weight reduction for blood pressure and overall health. Prefers lifestyle changes over weight loss medications. - Prescribe furosemide  20 mg daily (was taking prn) - Prescribe potassium supplement daily - Recheck blood pressure in 3 months - Order labs in 2 weeks to check potassium levels      Relevant Medications   furosemide  (LASIX ) 20 MG  tablet     Other   Avitaminosis D   Other Visit Diagnoses       Annual physical exam    -  Primary   Relevant Orders   Comprehensive metabolic panel   Lipid Panel With LDL/HDL Ratio   VITAMIN D  25 Hydroxy (Vit-D Deficiency, Fractures)   CBC w/Diff/Platelet     Need for shingles vaccine       Relevant Orders   Varicella-zoster vaccine IM (Completed)     Screening for colon cancer       Relevant Orders   Ambulatory referral to Gastroenterology     Breast cancer screening by mammogram       Relevant Orders   MM 3D SCREENING MAMMOGRAM BILATERAL BREAST     Localized edema       Relevant Medications   furosemide  (LASIX ) 20 MG tablet           Weight Management Acknowledges need for weight loss to improve blood pressure and overall health. Discussed benefits of weight loss and strategies including exercise and diet. Prefers lifestyle changes over weight loss medications. Discussed potential benefits of beet root supplements with minimal downside. - Encourage regular exercise, including walking and weightlifting - Follow up on weight management in 3 months  Allergic Rhinitis Chronic allergic rhinitis managed with Flonase  nasal spray and Zyrtec . No new symptoms reported. - Continue Flonase  nasal spray - Continue Zyrtec   General Health Maintenance Routine health maintenance and screenings discussed. Up to date on most vaccinations and screenings. Due for mammogram and colonoscopy soon. Pap smear due in  November. Second shingles shot to be administered today. Labs to be checked in 2 weeks for kidney and liver function, cholesterol, and vitamin D  levels. - Order mammogram - Send referral to GI for colonoscopy - Schedule Pap smear for November - Administer second shingles shot - Check kidney and liver function, cholesterol, and vitamin D  levels in 2 weeks  Follow-up - Follow up in 3 months for blood pressure check - Review lab results in 2 weeks and adjust potassium as needed.        Return in about 3 months (around 02/26/2024) for BP f/u.     Jon Eva, MD  [facility] [phone] (phone) [phone] (fax)  [facility]

## 2023-12-02 MED ORDER — CETIRIZINE HCL 10 MG PO TABS: ORAL_TABLET | ORAL | 3 refills | Status: DC

## 2023-12-03 ENCOUNTER — Telehealth: Payer: Self-pay

## 2023-12-03 ENCOUNTER — Other Ambulatory Visit: Payer: Self-pay

## 2023-12-03 DIAGNOSIS — Z1211 Encounter for screening for malignant neoplasm of colon: Secondary | ICD-10-CM

## 2023-12-03 MED ORDER — NA SULFATE-K SULFATE-MG SULF 17.5-3.13-1.6 GM/177ML PO SOLN
1.0000 | Freq: Once | ORAL | 0 refills | Status: AC
Start: 2023-12-03 — End: 2023-12-03

## 2023-12-03 NOTE — Telephone Encounter (Signed)
Gastroenterology Pre-Procedure Review  Request Date: 03/20/24 Requesting Physician: Dr. Allegra Lai  PATIENT REVIEW QUESTIONS: The patient responded to the following health history questions as indicated:    1. Are you having any GI issues? no 2. Do you have a personal history of Polyps? no 3. Do you have a family history of Colon Cancer or Polyps? no 4. Diabetes Mellitus? no 5. Joint replacements in the past 12 months?no 6. Major health problems in the past 3 months?no 7. Any artificial heart valves, MVP, or defibrillator?no    MEDICATIONS & ALLERGIES:    Patient reports the following regarding taking any anticoagulation/antiplatelet therapy:   Plavix, Coumadin, Eliquis, Xarelto, Lovenox, Pradaxa, Brilinta, or Effient? no Aspirin? no  Patient confirms/reports the following medications:  Current Outpatient Medications  Medication Sig Dispense Refill   amLODipine (NORVASC) 10 MG tablet Take 1 tablet (10 mg total) by mouth daily. OV due in Spring 2025 90 tablet 1   Ascorbic Acid (VITAMIN C) 1000 MG tablet Take 2,000 mg by mouth daily.     calcium carbonate (OSCAL) 1500 (600 Ca) MG TABS tablet Take by mouth.     cetirizine (ZYRTEC) 10 MG tablet TAKE 1 TABLET(10 MG) BY MOUTH DAILY 90 tablet 3   Cholecalciferol (VITAMIN D-3) 5000 units TABS Take 5,000 Units by mouth daily.     fluticasone (FLONASE) 50 MCG/ACT nasal spray Place 2 sprays into both nostrils daily. 16 g 11   furosemide (LASIX) 20 MG tablet Take 1 tablet (20 mg total) by mouth daily. 90 tablet 3   ibuprofen (ADVIL) 800 MG tablet Take 1 tablet (800 mg total) by mouth every 8 (eight) hours as needed. for pain 270 tablet 1   potassium chloride SA (KLOR-CON M) 20 MEQ tablet Take 1 tablet (20 mEq total) by mouth daily. 90 tablet 3   No current facility-administered medications for this visit.    Patient confirms/reports the following allergies:  Allergies  Allergen Reactions   Lisinopril Itching    Mouth Itching    Hydrochlorothiazide Rash    No orders of the defined types were placed in this encounter.   AUTHORIZATION INFORMATION Primary Insurance: 1D#: Group #:  Secondary Insurance: 1D#: Group #:  SCHEDULE INFORMATION: Date: 03/20/24 Time: Location: [facility]

## 2023-12-14 LAB — CBC WITH DIFFERENTIAL/PLATELET
Basophils Absolute: 0 10*3/uL (ref 0.0–0.2)
Basos: 0 %
EOS (ABSOLUTE): 0.1 10*3/uL (ref 0.0–0.4)
Eos: 2 %
Hematocrit: 39.9 % (ref 34.0–46.6)
Hemoglobin: 13.1 g/dL (ref 11.1–15.9)
Immature Grans (Abs): 0 10*3/uL (ref 0.0–0.1)
Immature Granulocytes: 0 %
Lymphocytes Absolute: 2.4 10*3/uL (ref 0.7–3.1)
Lymphs: 34 %
MCH: 31.7 pg (ref 26.6–33.0)
MCHC: 32.8 g/dL (ref 31.5–35.7)
MCV: 97 fL (ref 79–97)
Monocytes Absolute: 0.5 10*3/uL (ref 0.1–0.9)
Monocytes: 7 %
Neutrophils Absolute: 3.9 10*3/uL (ref 1.4–7.0)
Neutrophils: 57 %
Platelets: 243 10*3/uL (ref 150–450)
RBC: 4.13 x10E6/uL (ref 3.77–5.28)
RDW: 11.8 % (ref 11.7–15.4)
WBC: 6.9 10*3/uL (ref 3.4–10.8)

## 2023-12-14 LAB — VITAMIN D 25 HYDROXY (VIT D DEFICIENCY, FRACTURES): Vit D, 25-Hydroxy: 76.9 ng/mL (ref 30.0–100.0)

## 2023-12-14 LAB — COMPREHENSIVE METABOLIC PANEL
ALT: 20 IU/L (ref 0–32)
AST: 15 IU/L (ref 0–40)
Albumin: 4.7 g/dL (ref 3.8–4.9)
Alkaline Phosphatase: 104 IU/L (ref 44–121)
BUN/Creatinine Ratio: 14 (ref 9–23)
BUN: 11 mg/dL (ref 6–24)
Bilirubin Total: 1.1 mg/dL (ref 0.0–1.2)
CO2: 23 mmol/L (ref 20–29)
Calcium: 9.3 mg/dL (ref 8.7–10.2)
Chloride: 103 mmol/L (ref 96–106)
Creatinine, Ser: 0.78 mg/dL (ref 0.57–1.00)
Globulin, Total: 2.6 g/dL (ref 1.5–4.5)
Glucose: 87 mg/dL (ref 70–99)
Potassium: 4.7 mmol/L (ref 3.5–5.2)
Sodium: 141 mmol/L (ref 134–144)
Total Protein: 7.3 g/dL (ref 6.0–8.5)
eGFR: 87 mL/min/{1.73_m2} (ref >59)

## 2023-12-14 LAB — LIPID PANEL WITH LDL/HDL RATIO
Cholesterol, Total: 178 mg/dL (ref 100–199)
HDL: 46 mg/dL (ref >39)
LDL Chol Calc (NIH): 111 mg/dL — ABNORMAL HIGH (ref 0–99)
LDL/HDL Ratio: 2.4 ratio (ref 0.0–3.2)
Triglycerides: 116 mg/dL (ref 0–149)
VLDL Cholesterol Cal: 21 mg/dL (ref 5–40)

## 2023-12-16 ENCOUNTER — Encounter: Payer: Self-pay | Admitting: Family Medicine

## 2023-12-16 ENCOUNTER — Ambulatory Visit: Payer: Self-pay | Admitting: Family Medicine

## 2023-12-16 MED ORDER — ROSUVASTATIN CALCIUM 5 MG PO TABS: 5.0000 mg | ORAL_TABLET | Freq: Every day | ORAL | 3 refills | Status: DC

## 2023-12-16 NOTE — Telephone Encounter (Signed)
 Copied from CRM [id number]. Topic: Clinical - Red Word Triage >> Dec 16, 2023 12:15 PM Everette C wrote: [facility] that prompted transfer to Nurse Triage: The patient would like to speak with a member of clinical staff regarding their BP which was 160/99 when checked at 11:45 AM approx  Chief Complaint: elevated bp Symptoms: 160/99 Frequency: comes and goes Pertinent Negatives: Patient denies dizziness, cp, sob, numbness, tingling, headache, weakness Disposition: [] ED /[] Urgent Care (no appt availability in office) / [x] Appointment(In office/virtual)/ []  [place] [facility]/ [] Home Care/ [] Refused Recommended Disposition /[] [place] [facility]/ []  Follow-up with PCP Additional Notes: Per protocol apt made for tomorrow.  Instructed to go to er if becomes worse.  Care advice given, denies questions, pcp office updated.   Reason for Disposition  Systolic BP  >= 160 OR Diastolic >= 100  Answer Assessment - Initial Assessment Questions 1. BLOOD PRESSURE: "What is the blood pressure?" "Did you take at least two measurements 5 minutes apart?"     160/99 2. ONSET: "When did you take your blood pressure?"     Today around 1145 am 3. HOW: "How did you take your blood pressure?" (e.g., automatic home BP monitor, visiting nurse)     Automatic cuff 4. HISTORY: "Do you have a history of high blood pressure?"     yes 5. MEDICINES: "Are you taking any medicines for blood pressure?" "Have you missed any doses recently?"     amlodipine 6. OTHER SYMPTOMS: "Do you have any symptoms?" (e.g., blurred vision, chest pain, difficulty breathing, headache, weakness)     denies 7. PREGNANCY: "Is there any chance you are pregnant?" "When was your last menstrual period?"     na  Protocols used: Blood Pressure - High-A-AH

## 2023-12-17 ENCOUNTER — Ambulatory Visit: Payer: Managed Care, Other (non HMO) | Admitting: Family Medicine

## 2023-12-17 DIAGNOSIS — I10 Essential (primary) hypertension: Secondary | ICD-10-CM

## 2023-12-19 ENCOUNTER — Encounter: Payer: Self-pay | Admitting: Family Medicine

## 2023-12-19 ENCOUNTER — Ambulatory Visit: Payer: Managed Care, Other (non HMO) | Admitting: Family Medicine

## 2023-12-19 VITALS — BP 133/77 | HR 103 | Wt 236.7 lb

## 2023-12-19 DIAGNOSIS — E7800 Pure hypercholesterolemia, unspecified: Secondary | ICD-10-CM | POA: Diagnosis not present

## 2023-12-19 DIAGNOSIS — I10 Essential (primary) hypertension: Secondary | ICD-10-CM

## 2023-12-19 NOTE — Assessment & Plan Note (Signed)
 Hypertension managed with daily furosemide and amlodipine. Recent ER visit due to high-salt meal causing a spike in blood pressure. Patient has initiated lifestyle changes including increased water intake and daily walking. Blood pressure control is crucial to reduce cardiovascular risk. Discussed importance of hydration and monitoring for fluid retention. Explained that occasional spikes can be managed with increased water intake and monitoring, but consistent high readings or symptoms like chest pain require immediate attention. - Continue amlodipine 10 mg daily - Continue furosemide 20 mg daily with K supplementation - Monitor blood pressure regularly - Increase water intake to 64 ounces daily - Continue daily walking for 30-35 minutes - Avoid high-sodium meals - Reassess blood pressure control in May

## 2023-12-19 NOTE — Progress Notes (Signed)
 Established patient visit   Patient: Ashley Ballard   DOB: Nov 19, 1965   58 y.o. Female  MRN: [id number] Visit Date: 12/19/2023  Today's healthcare provider: Shirlee Latch, MD   Chief Complaint  Patient presents with   Medical Management of Chronic Issues    Last seen 11/29/23.  -Prescribed furosemide 20 mg daily (was taking prn) -Prescribed potassium supplement daily -Seen in ED on 12/16/23 for elevated blood pressure readings   Hypertension    Patient reports taking medications as prescribed and tolerating well with no side effects. Reports monitoring at home and states that she tries to check at least 1-2 times a week. Highest reading she has gotten is 169/99. Reports Saturday she consumed a lot of salt intake and then Sunday only had about 2 glasses of water. Started walking on Monday for 1 mile and a half taking 33 minutes and completes a 15 minute exercise video on her break.    Subjective    Hypertension   HPI     Medical Management of Chronic Issues    Additional comments: Last seen 11/29/23.  -Prescribed furosemide 20 mg daily (was taking prn) -Prescribed potassium supplement daily -Seen in ED on 12/16/23 for elevated blood pressure readings        Hypertension    Additional comments: Patient reports taking medications as prescribed and tolerating well with no side effects. Reports monitoring at home and states that she tries to check at least 1-2 times a week. Highest reading she has gotten is 169/99. Reports Saturday she consumed a lot of salt intake and then Sunday only had about 2 glasses of water. Started walking on Monday for 1 mile and a half taking 33 minutes and completes a 15 minute exercise video on her break.       Last edited by Acey Lav, CMA on 12/19/2023  8:23 AM.       Discussed the use of AI scribe software for clinical note transcription with the patient, who gave verbal consent to proceed.  History of Present Illness   The  patient, with a history of hypertension and high cholesterol, presents for a follow-up visit after a recent ER visit due to a spike in blood pressure following a high-salt meal. The patient reports that since the last visit, she has lost six pounds through dietary changes and increased physical activity. She has been consuming less red meat, more chicken, fish, and beans, and has been making her own salad dressing and butter. She has also been walking for 30-35 minutes during lunch breaks, amounting to about a mile and a half every day. She plans to increase her physical activity by adding morning walks once the weather improves. The patient has also been monitoring her fluid intake, ensuring she drinks at least 64 ounces of water a day. She has been taking her medications as prescribed, including Lasix, amlodipine, and Crestor.        The 10-year ASCVD risk score (Arnett DK, et al., 2019) is: 7.2%   Medications: Outpatient Medications Prior to Visit  Medication Sig   amLODipine (NORVASC) 10 MG tablet Take 1 tablet (10 mg total) by mouth daily. OV due in Spring 2025   Ascorbic Acid (VITAMIN C) 1000 MG tablet Take 2,000 mg by mouth daily.   calcium carbonate (OSCAL) 1500 (600 Ca) MG TABS tablet Take by mouth.   cetirizine (ZYRTEC) 10 MG tablet TAKE 1 TABLET(10 MG) BY MOUTH DAILY   Cholecalciferol (VITAMIN D-3) 5000  units TABS Take 5,000 Units by mouth daily.   fluticasone (FLONASE) 50 MCG/ACT nasal spray Place 2 sprays into both nostrils daily.   furosemide (LASIX) 20 MG tablet Take 1 tablet (20 mg total) by mouth daily.   ibuprofen (ADVIL) 800 MG tablet Take 1 tablet (800 mg total) by mouth every 8 (eight) hours as needed. for pain   potassium chloride SA (KLOR-CON M) 20 MEQ tablet Take 1 tablet (20 mEq total) by mouth daily.   rosuvastatin (CRESTOR) 5 MG tablet Take 1 tablet (5 mg total) by mouth daily.   No facility-administered medications prior to visit.    Review of Systems      Objective    BP 133/77 (BP Location: Left Arm, Patient Position: Sitting, Cuff Size: Large)   Pulse (!) 103   Wt 236 lb 11.2 oz (107.4 kg)   LMP 11/19/2015   SpO2 99%   BMI 38.79 kg/m    Physical Exam Vitals reviewed.  Constitutional:      General: She is not in acute distress.    Appearance: Normal appearance. She is well-developed. She is not diaphoretic.  HENT:     Head: Normocephalic and atraumatic.  Eyes:     General: No scleral icterus.    Conjunctiva/sclera: Conjunctivae normal.  Neck:     Thyroid: No thyromegaly.  Cardiovascular:     Rate and Rhythm: Normal rate and regular rhythm.     Heart sounds: Normal heart sounds. No murmur heard. Pulmonary:     Effort: Pulmonary effort is normal. No respiratory distress.     Breath sounds: Normal breath sounds. No wheezing, rhonchi or rales.  Musculoskeletal:     Cervical back: Neck supple.     Right lower leg: No edema.     Left lower leg: No edema.  Lymphadenopathy:     Cervical: No cervical adenopathy.  Skin:    General: Skin is warm and dry.  Neurological:     Mental Status: She is alert. Mental status is at baseline.      No results found for any visits on 12/19/23.  Assessment & Plan     Problem List Items Addressed This Visit       Cardiovascular and Mediastinum   Essential hypertension - Primary   Hypertension managed with daily furosemide and amlodipine. Recent ER visit due to high-salt meal causing a spike in blood pressure. Patient has initiated lifestyle changes including increased water intake and daily walking. Blood pressure control is crucial to reduce cardiovascular risk. Discussed importance of hydration and monitoring for fluid retention. Explained that occasional spikes can be managed with increased water intake and monitoring, but consistent high readings or symptoms like chest pain require immediate attention. - Continue amlodipine 10 mg daily - Continue furosemide 20 mg daily with K  supplementation - Monitor blood pressure regularly - Increase water intake to 64 ounces daily - Continue daily walking for 30-35 minutes - Avoid high-sodium meals - Reassess blood pressure control in May        Other   Morbid obesity (HCC)   Patient has lost 6 pounds in the past month through dietary changes and increased physical activity. Weight loss is contributing to better blood pressure and cholesterol management. Discussed the importance of continued weight loss for overall health improvement and potential medication reduction. - Continue current dietary plan - Continue daily walking and aim for 10,000 steps per day - Encourage maintaining weight loss efforts      Pure hypercholesterolemia  Elevated LDL cholesterol, previously 87, now 111. Currently on rosuvastatin 5 mg daily. Patient has made significant lifestyle changes including dietary modifications and weight loss. Goal is to reduce cholesterol levels and potentially discontinue rosuvastatin if levels improve. Discussed ASCVD risk score and its implications for long-term cardiovascular health. Explained that improved blood pressure and cholesterol levels could lower this risk, potentially allowing for medication adjustments. - Continue rosuvastatin 5 mg daily - Limit red meat to once or twice a week, choosing lean cuts - Increase intake of chicken, fish, beans, and vegetables - Use unsalted butter and whole wheat bread - Reassess cholesterol levels in May           General Health Maintenance Patient is making significant lifestyle changes to improve overall health, including dietary modifications and increased physical activity. Discussed the benefits of these changes for long-term health and the importance of monitoring for signs of fluid retention and swelling. - Encourage continued lifestyle changes - Monitor for signs of fluid retention and swelling - Maintain low sodium intake  Follow-up - Follow-up appointment  in May - Reassess blood pressure, cholesterol levels, and weight at next visit.       Return in about 3 months (around 03/17/2024) for as scheduled.       Shirlee Latch, MD  [facility] [phone] (phone) [phone] (fax)  [facility]

## 2023-12-19 NOTE — Assessment & Plan Note (Signed)
 Elevated LDL cholesterol, previously 87, now 111. Currently on rosuvastatin 5 mg daily. Patient has made significant lifestyle changes including dietary modifications and weight loss. Goal is to reduce cholesterol levels and potentially discontinue rosuvastatin if levels improve. Discussed ASCVD risk score and its implications for long-term cardiovascular health. Explained that improved blood pressure and cholesterol levels could lower this risk, potentially allowing for medication adjustments. - Continue rosuvastatin 5 mg daily - Limit red meat to once or twice a week, choosing lean cuts - Increase intake of chicken, fish, beans, and vegetables - Use unsalted butter and whole wheat bread - Reassess cholesterol levels in May

## 2023-12-19 NOTE — Assessment & Plan Note (Signed)
 Patient has lost 6 pounds in the past month through dietary changes and increased physical activity. Weight loss is contributing to better blood pressure and cholesterol management. Discussed the importance of continued weight loss for overall health improvement and potential medication reduction. - Continue current dietary plan - Continue daily walking and aim for 10,000 steps per day - Encourage maintaining weight loss efforts

## 2024-01-10 ENCOUNTER — Ambulatory Visit
Admission: RE | Admit: 2024-01-10 | Discharge: 2024-01-10 | Disposition: A | Discharge status: Home / Self Care | Source: Ambulatory Visit | Attending: Family Medicine | Admitting: Family Medicine

## 2024-01-10 DIAGNOSIS — Z1231 Encounter for screening mammogram for malignant neoplasm of breast: Secondary | ICD-10-CM | POA: Diagnosis present

## 2024-01-16 ENCOUNTER — Encounter: Payer: Self-pay | Admitting: Family Medicine

## 2024-02-03 ENCOUNTER — Encounter: Payer: Self-pay | Admitting: Family Medicine

## 2024-02-03 ENCOUNTER — Other Ambulatory Visit: Payer: Self-pay | Admitting: Family Medicine

## 2024-02-03 DIAGNOSIS — R600 Localized edema: Secondary | ICD-10-CM

## 2024-02-03 MED ORDER — FUROSEMIDE 20 MG PO TABS: 20.0000 mg | ORAL_TABLET | Freq: Every day | ORAL | 0 refills | Status: DC

## 2024-02-03 NOTE — Telephone Encounter (Signed)
 Ok to send by protocol

## 2024-02-03 NOTE — Telephone Encounter (Signed)
 Copied from CRM [id number]. Topic: Clinical - Medication Refill >> Feb 03, 2024  8:40 AM Orien Bird wrote: Most Recent Primary Care Visit:  Provider: Mazie Speed  Department: [facility]  Visit Type: OFFICE VISIT  Date: 12/19/2023  Medication: furosemide (LASIX) 20 MG tablet  Has the patient contacted their pharmacy? No (Agent: If no, request that the patient contact the pharmacy for the refill. If patient does not wish to contact the pharmacy document the reason why and proceed with request.) (Agent: If yes, when and what did the pharmacy advise?)  Is this the correct pharmacy for this prescription? Yes If no, delete pharmacy and type the correct one.  This is the patient's preferred pharmacy:   [facility] [address], [place], [place] [address] Phone number: [phone]  Patient did state she is on vacation and she needs at least 4 pills to hold her until she goes back home.   Has the prescription been filled recently? No  Is the patient out of the medication? No  Has the patient been seen for an appointment in the last year OR does the patient have an upcoming appointment? Yes  Can we respond through MyChart? Yes  Agent: Please be advised that Rx refills may take up to 3 business days. We ask that you follow-up with your pharmacy.

## 2024-02-03 NOTE — Telephone Encounter (Signed)
 Copied from CRM [id number]. Topic: Clinical - Medication Refill >> Feb 03, 2024  8:40 AM Orien Bird wrote: Most Recent Primary Care Visit:  Provider: Mazie Speed  Department: [facility]  Visit Type: OFFICE VISIT  Date: 12/19/2023  Medication: furosemide (LASIX) 20 MG tablet  Has the patient contacted their pharmacy? No (Agent: If no, request that the patient contact the pharmacy for the refill. If patient does not wish to contact the pharmacy document the reason why and proceed with request.) (Agent: If yes, when and what did the pharmacy advise?)  Is this the correct pharmacy for this prescription?  If no, delete pharmacy and type the correct one.  This is the patient's preferred pharmacy:   [facility] [address], [place], [place] [address] Phone number: [phone]  Patient did state she is on vacation and she needs at least 4 pills to hold her until she goes back home.   Has the prescription been filled recently? No  Is the patient out of the medication? No  Has the patient been seen for an appointment in the last year OR does the patient have an upcoming appointment? Yes  Can we respond through MyChart? Yes  Agent: Please be advised that Rx refills may take up to 3 business days. We ask that you follow-up with your pharmacy.

## 2024-02-15 ENCOUNTER — Ambulatory Visit: Admission: EM | Admit: 2024-02-15 | Discharge: 2024-02-15 | Disposition: A | Discharge status: Home / Self Care

## 2024-02-15 ENCOUNTER — Encounter: Payer: Self-pay | Admitting: Emergency Medicine

## 2024-02-15 DIAGNOSIS — M722 Plantar fascial fibromatosis: Secondary | ICD-10-CM

## 2024-02-15 NOTE — ED Provider Notes (Signed)
 [facility] - [place], [place]   Name: Ashley Ballard DOB: [birth year] MRN: [id number] CSN: [id number] PCP: Mazie Speed, MD  Arrival date and time:  02/15/24 1450  Chief Complaint:  Foot Pain (left)   NOTE: Prior to seeing the patient today, I have reviewed the triage nursing documentation and vital signs. Clinical staff has updated patient's PMH/PSHx, current medication list, and drug allergies/intolerances to ensure comprehensive history available to assist in medical decision making.   History:   HPI: Ashley Ballard is a 58 y.o. female who presents today alone with complaints of left foot pain.  Patient states this has been ongoing for the past 2 weeks.  The pain starts at her heel and radiates slightly to the middle of her foot and back of her foot.  She notices that her foot is stiff upon awakening and takes time for the stiffness to resolve.  Of note, she has increased her physical activity, walking for longer periods of time.  She has tried to treat the pain with naproxen and Bengay and has noticed mild to moderate leaf with the NSAID.  No recent injury to that foot or extremity.   Past Medical History:  Diagnosis Date   Family history of adverse reaction to anesthesia    mother had problems   GERD (gastroesophageal reflux disease)    Hypertension     Past Surgical History:  Procedure Laterality Date   BIOPSY ENDOMETRIAL  01/21/2014   BREAST CYST EXCISION Left 1983   DILATATION & CURETTAGE/HYSTEROSCOPY WITH MYOSURE N/A 03/04/2018   Procedure: DILATATION & CURETTAGE/HYSTEROSCOPY;  Surgeon: Kris Pester, MD;  Location: [facility] ORS;  Service: Gynecology;  Laterality: N/A;   TUBAL LIGATION      Family History  Problem Relation Age of Onset   Hypertension Mother    Diabetes Mother    Breast cancer Mother 17   Pneumonia Paternal Grandmother    Leukemia Paternal Grandfather    Breast cancer Maternal Grandmother 60   Heart attack Paternal Uncle     Kidney cancer Maternal Grandfather    Hypertension Daughter     Social History   Tobacco Use   Smoking status: Never   Smokeless tobacco: Never  Vaping Use   Vaping status: Never Used  Substance Use Topics   Alcohol use: Never   Drug use: Never    Patient Active Problem List   Diagnosis Date Noted   Pure hypercholesterolemia 12/19/2023   Arm swelling 11/27/2022   Endometrial polyp 01/08/2018   B12 deficiency 04/12/2017   Essential hypertension 01/06/2016   Chronic sinusitis 05/02/2015   Fibrocystic breast changes 04/07/2015   Acid reflux 04/07/2015   Low back pain with sciatica 04/07/2015   Morbid obesity (HCC) 04/07/2015   Avitaminosis D 04/07/2015    Home Medications:    No outpatient medications have been marked as taking for the 02/15/24 encounter [facility] Encounter).    Allergies:   Lisinopril  and Hydrochlorothiazide   Review of Systems (ROS): Review of Systems  Musculoskeletal:  Positive for arthralgias and myalgias. Negative for gait problem, neck pain and neck stiffness.  All other systems reviewed and are negative.    Vital Signs: Today's Vitals   02/15/24 1514 02/15/24 1516  BP:  118/72  Pulse:  87  Resp:  14  Temp:  97.8 F (36.6 C)  TempSrc:  Oral  SpO2:  100%  Weight: 236 lb 12.4 oz (107.4 kg)   Height: 5' 5.5" (1.664 m)   PainSc: 8  Physical Exam: Physical Exam Vitals and nursing note reviewed.  Constitutional:      Appearance: Normal appearance.  Cardiovascular:     Rate and Rhythm: Normal rate and regular rhythm.     Pulses: Normal pulses.          Dorsalis pedis pulses are 2+ on the left side.       Posterior tibial pulses are 2+ on the left side.     Heart sounds: Normal heart sounds.  Pulmonary:     Breath sounds: Normal breath sounds.  Musculoskeletal:     Left foot: Normal range of motion. No swelling or laceration.     Comments: Tenderness to heel with deep palpitation.  Increase pain with foot flexion.  Feet:      Left foot:     Skin integrity: Skin integrity normal.  Skin:    General: Skin is warm and dry.  Neurological:     Mental Status: She is alert.  Psychiatric:        Mood and Affect: Mood normal.        Behavior: Behavior normal.        Thought Content: Thought content normal.      Urgent Care Treatments / Results:   LABS: PLEASE NOTE: all labs that were ordered this encounter are listed, however only abnormal results are displayed. Labs Reviewed - No data to display  EKG: -None  RADIOLOGY: No results found.  PROCEDURES: Procedures  MEDICATIONS RECEIVED THIS VISIT: Medications - No data to display  PERTINENT CLINICAL COURSE NOTES/UPDATES:   Initial Impression / Assessment and Plan / Urgent Care Course:  Pertinent labs & imaging results that were available during my care of the patient were personally reviewed by me and considered in my medical decision making (see lab/imaging section of note for values and interpretations).  Ashley Ballard is a 58 y.o. female who presents to [facility] today with complaints of left foot pain, diagnosed with plantar fasciitis, and treated as such with the directions below. NP and patient reviewed discharge instructions below during visit.   Patient is well appearing overall in clinic today. She does not appear to be in any acute distress. Presenting symptoms (see HPI) and exam as documented above.   I have reviewed the follow up and strict return precautions for any new or worsening symptoms. Patient is aware of symptoms that would be deemed urgent/emergent, and would thus require further evaluation either here or in the emergency department. At the time of discharge, she verbalized understanding and consent with the discharge plan as it was reviewed with her. All questions were fielded by provider and/or clinic staff prior to patient discharge.    Final Clinical Impressions / Urgent Care Diagnoses:   Final diagnoses:  Plantar  fasciitis of left foot    New Prescriptions:  [place] Controlled Substance Registry consulted? Not Applicable  No orders of the defined types were placed in this encounter.     Discharge Instructions      You were seen for left foot pain and are being treated for plantar fasciitis.   -You can take 600 mg of ibuprofen  up to 3 times a day with food to help with the pain, specifically when the pain gets worse. - Use a frozen water bottle to roll your feet over right after walking. - If the pain continues, you may need further evaluation to get better support while walking, such as with the podiatrist.  Take care, Dr. Amye Baller, NP-c  Recommended Follow up Care:  Patient encouraged to follow up with the following provider within the specified time frame, or sooner as dictated by the severity of her symptoms. As always, she was instructed that for any urgent/emergent care needs, she should seek care either here or in the emergency department for more immediate evaluation.   Providence Brow, DNP, NP-c   Providence Brow, NP 02/15/24 [phone]

## 2024-02-15 NOTE — Discharge Instructions (Addendum)
 You were seen for left foot pain and are being treated for plantar fasciitis.   -You can take 600 mg of ibuprofen  up to 3 times a day with food to help with the pain, specifically when the pain gets worse. - Use a frozen water bottle to roll your feet over right after walking. - If the pain continues, you may need further evaluation to get better support while walking, such as with the podiatrist.  Take care, Dr. Amye Baller, NP-c

## 2024-02-15 NOTE — ED Triage Notes (Signed)
 Patient c/o pain on the bottom of her left foot and left heel of her foot that started yesterday.

## 2024-02-18 ENCOUNTER — Ambulatory Visit (INDEPENDENT_AMBULATORY_CARE_PROVIDER_SITE_OTHER): Admitting: Podiatry

## 2024-02-18 DIAGNOSIS — M722 Plantar fascial fibromatosis: Secondary | ICD-10-CM

## 2024-02-18 NOTE — Progress Notes (Signed)
 Subjective:  Patient ID: Ashley Ballard, female    DOB: [birth year],  MRN: [id number]  Chief Complaint  Patient presents with   Plantar Fasciitis    Pt stated that she has some left heel pain    58 y.o. female presents with the above complaint.  Patient presents with left heel pain that has been there for quite some time is progressive and worse worse with ambulation or shoe pressure patient would like to discuss treatment options for has not seen MRIs prior to seeing me pain scale 7 out of 10 dull aching nature   Review of Systems: Negative except as noted in the HPI. Denies N/V/F/Ch.  Past Medical History:  Diagnosis Date   Family history of adverse reaction to anesthesia    mother had problems   GERD (gastroesophageal reflux disease)    Hypertension     Current Outpatient Medications:    amLODipine  (NORVASC ) 10 MG tablet, Take 1 tablet (10 mg total) by mouth daily. OV due in Spring 2025, Disp: 90 tablet, Rfl: 1   Ascorbic Acid (VITAMIN C) 1000 MG tablet, Take 2,000 mg by mouth daily., Disp: , Rfl:    calcium  carbonate (OSCAL) 1500 (600 Ca) MG TABS tablet, Take by mouth., Disp: , Rfl:    cetirizine  (ZYRTEC ) 10 MG tablet, TAKE 1 TABLET(10 MG) BY MOUTH DAILY, Disp: 90 tablet, Rfl: 3   Cholecalciferol (VITAMIN D -3) 5000 units TABS, Take 5,000 Units by mouth daily., Disp: , Rfl:    fluticasone  (FLONASE ) 50 MCG/ACT nasal spray, Place 2 sprays into both nostrils daily., Disp: 16 g, Rfl: 11   furosemide  (LASIX ) 20 MG tablet, Take 1 tablet (20 mg total) by mouth daily., Disp: 90 tablet, Rfl: 3   furosemide  (LASIX ) 20 MG tablet, Take 1 tablet (20 mg total) by mouth daily., Disp: 30 tablet, Rfl: 0   ibuprofen  (ADVIL ) 800 MG tablet, Take 1 tablet (800 mg total) by mouth every 8 (eight) hours as needed. for pain, Disp: 270 tablet, Rfl: 1   potassium chloride  SA (KLOR-CON  M) 20 MEQ tablet, Take 1 tablet (20 mEq total) by mouth daily., Disp: 90 tablet, Rfl: 3   rosuvastatin  (CRESTOR ) 5 MG  tablet, Take 1 tablet (5 mg total) by mouth daily., Disp: 90 tablet, Rfl: 3  Social History   Tobacco Use  Smoking Status Never  Smokeless Tobacco Never    Allergies  Allergen Reactions   Lisinopril  Itching    Mouth Itching   Hydrochlorothiazide  Rash   Objective:  There were no vitals filed for this visit. There is no height or weight on file to calculate BMI. Constitutional Well developed. Well nourished.  Vascular Dorsalis pedis pulses palpable bilaterally. Posterior tibial pulses palpable bilaterally. Capillary refill normal to all digits.  No cyanosis or clubbing noted. Pedal hair growth normal.  Neurologic Normal speech. Oriented to person, place, and time. Epicritic sensation to light touch grossly present bilaterally.  Dermatologic Nails well groomed and normal in appearance. No open wounds. No skin lesions.  Orthopedic: Normal joint ROM without pain or crepitus bilaterally. No visible deformities. Tender to palpation at the calcaneal tuber left. No pain with calcaneal squeeze left. Ankle ROM diminished range of motion left. Silfverskiold Test: positive left.   Radiographs: None  Assessment:   1. Plantar fasciitis of left foot    Plan:  Patient was evaluated and treated and all questions answered.  Plantar Fasciitis, left - XR reviewed as above.  - Educated on icing and stretching. Instructions given.  -  Injection delivered to the plantar fascia as below. - DME: Plantar fascial brace dispensed to support the medial longitudinal arch of the foot and offload pressure from the heel and prevent arch collapse during weightbearing - Pharmacologic management: None  Procedure: Injection Tendon/Ligament Location: Left plantar fascia at the glabrous junction; medial approach. Skin Prep: alcohol Injectate: 0.5 cc 0.5% marcaine plain, 0.5 cc of 1% Lidocaine , 0.5 cc kenalog  10. Disposition: Patient tolerated procedure well. Injection site dressed with a  band-aid.  No follow-ups on file.

## 2024-02-27 ENCOUNTER — Telehealth: Payer: Self-pay

## 2024-02-27 ENCOUNTER — Other Ambulatory Visit: Payer: Self-pay

## 2024-02-27 ENCOUNTER — Encounter: Payer: Self-pay | Admitting: Family Medicine

## 2024-02-27 ENCOUNTER — Ambulatory Visit: Admitting: Family Medicine

## 2024-02-27 VITALS — BP 136/86 | HR 90 | Ht 65.5 in | Wt 231.9 lb

## 2024-02-27 DIAGNOSIS — E7800 Pure hypercholesterolemia, unspecified: Secondary | ICD-10-CM

## 2024-02-27 DIAGNOSIS — Z1211 Encounter for screening for malignant neoplasm of colon: Secondary | ICD-10-CM

## 2024-02-27 DIAGNOSIS — I10 Essential (primary) hypertension: Secondary | ICD-10-CM

## 2024-02-27 NOTE — Assessment & Plan Note (Signed)
 Discussed importance of healthy weight management Discussed diet and exercise

## 2024-02-27 NOTE — Assessment & Plan Note (Signed)
 Hyperlipidemia is managed with Crestor . Previous labs indicated a slight increase in cholesterol levels. Labs are due to assess current cholesterol levels and liver function. - Order labs for cholesterol and liver function.

## 2024-02-27 NOTE — Progress Notes (Signed)
 Established patient visit   Patient: Ashley Ballard   DOB: 18-Apr-1966   58 y.o. Female  MRN: [id number] Visit Date: 02/27/2024  Today's healthcare provider: Aden Agreste, MD   Chief Complaint  Patient presents with   Medical Management of Chronic Issues    Pt reports she is taking medications as prescribed with no symptoms to report   Hypertension    Pt reports monitoring at home   Hyperlipidemia   Weight Check    236 lb 1.02 oz at last visit on 12/19/23   Subjective    Hypertension  Hyperlipidemia   HPI     Medical Management of Chronic Issues    Additional comments: Pt reports she is taking medications as prescribed with no symptoms to report        Hypertension    Additional comments: Pt reports monitoring at home        Weight Check    Additional comments: 236 lb 1.02 oz at last visit on 12/19/23      Last edited by Pasty Bongo, CMA on 02/27/2024  8:11 AM.       Discussed the use of AI scribe software for clinical note transcription with the patient, who gave verbal consent to proceed.  History of Present Illness   Ashley Ballard is a 58 year old female with hypertension, hyperlipidemia, and morbid obesity who presents for chronic follow-up.  She experiences plantar fasciitis in her left foot, initially triggered by walking and worsened by stepping on a rock. This results in difficulty putting pressure on the foot and limping. She received a cortisone injection from a podiatrist and continues to ice the foot while walking at a slower pace.  Her current medications include amlodipine  10 mg daily, Lasix  20 mg daily, a potassium supplement, and Crestor  5 mg daily. She has no issues with her medications and has refills available for potassium, Lasix , and Crestor . Amlodipine  is obtained through mail order.  She is actively trying to lose weight and has lost five pounds. She walks five to six days a week and is considering incorporating weights  into her exercise routine.  A previous colonoscopy was scheduled for the end of May, but she has not received any cancellation notice despite records indicating otherwise. She plans to follow up on this matter.         Medications: Outpatient Medications Prior to Visit  Medication Sig   amLODipine  (NORVASC ) 10 MG tablet Take 1 tablet (10 mg total) by mouth daily. OV due in Spring 2025   Ascorbic Acid (VITAMIN C) 1000 MG tablet Take 2,000 mg by mouth daily.   calcium  carbonate (OSCAL) 1500 (600 Ca) MG TABS tablet Take by mouth.   cetirizine  (ZYRTEC ) 10 MG tablet TAKE 1 TABLET(10 MG) BY MOUTH DAILY   Cholecalciferol (VITAMIN D -3) 5000 units TABS Take 5,000 Units by mouth daily.   fluticasone  (FLONASE ) 50 MCG/ACT nasal spray Place 2 sprays into both nostrils daily.   furosemide  (LASIX ) 20 MG tablet Take 1 tablet (20 mg total) by mouth daily.   furosemide  (LASIX ) 20 MG tablet Take 1 tablet (20 mg total) by mouth daily.   ibuprofen  (ADVIL ) 800 MG tablet Take 1 tablet (800 mg total) by mouth every 8 (eight) hours as needed. for pain   potassium chloride  SA (KLOR-CON  M) 20 MEQ tablet Take 1 tablet (20 mEq total) by mouth daily.   rosuvastatin  (CRESTOR ) 5 MG tablet Take 1 tablet (5 mg total) by mouth  daily.   No facility-administered medications prior to visit.    Review of Systems     Objective    BP 136/86 (BP Location: Left Arm, Patient Position: Sitting, Cuff Size: Large)   Pulse 90   Ht 5' 5.5" (1.664 m)   Wt 231 lb 14.4 oz (105.2 kg)   LMP 11/19/2015   SpO2 100%   BMI 38.00 kg/m    Physical Exam Vitals reviewed.  Constitutional:      General: She is not in acute distress.    Appearance: Normal appearance. She is well-developed. She is not diaphoretic.  HENT:     Head: Normocephalic and atraumatic.  Eyes:     General: No scleral icterus.    Conjunctiva/sclera: Conjunctivae normal.  Neck:     Thyroid: No thyromegaly.  Cardiovascular:     Rate and Rhythm: Normal rate  and regular rhythm.     Heart sounds: Normal heart sounds. No murmur heard. Pulmonary:     Effort: Pulmonary effort is normal. No respiratory distress.     Breath sounds: Normal breath sounds. No wheezing, rhonchi or rales.  Musculoskeletal:     Cervical back: Neck supple.     Right lower leg: No edema.     Left lower leg: No edema.  Lymphadenopathy:     Cervical: No cervical adenopathy.  Skin:    General: Skin is warm and dry.     Findings: No rash.  Neurological:     Mental Status: She is alert and oriented to person, place, and time. Mental status is at baseline.  Psychiatric:        Mood and Affect: Mood normal.        Behavior: Behavior normal.      No results found for any visits on 02/27/24.  Assessment & Plan     Problem List Items Addressed This Visit       Cardiovascular and Mediastinum   Essential hypertension - Primary   Hypertension is well-controlled with current medication regimen. Blood pressure readings are satisfactory.      Relevant Orders   Comprehensive metabolic panel with GFR   Lipid panel     Other   Morbid obesity (HCC)   Discussed importance of healthy weight management Discussed diet and exercise       Relevant Orders   Comprehensive metabolic panel with GFR   Lipid panel   Pure hypercholesterolemia   Hyperlipidemia is managed with Crestor . Previous labs indicated a slight increase in cholesterol levels. Labs are due to assess current cholesterol levels and liver function. - Order labs for cholesterol and liver function.      Relevant Orders   Comprehensive metabolic panel with GFR   Lipid panel        Plantar Fasciitis Plantar fasciitis in the left foot, previously treated with a cortisone injection. Symptoms have improved but may require another injection in five weeks if symptoms persist. - Consider follow-up with podiatrist in five weeks for potential repeat cortisone injection.   Routine follow-up visit. Blood pressure is  well-controlled. Labs are due for cholesterol, kidney, and liver function monitoring. Discussed scheduling for future physical and Pap smear to align with health maintenance schedule. - Order labs for cholesterol, kidney, and liver function. - Schedule follow-up in six months. - Plan for Pap smear in six months.       Return in about 6 months (around 08/29/2024) for chronic disease f/u.       Aden Agreste, MD  [facility]  [facility] [phone] (phone) [phone] (fax)  [facility]

## 2024-02-27 NOTE — Telephone Encounter (Signed)
 Pt requesting call back to reschedule colonoscopy

## 2024-02-27 NOTE — Telephone Encounter (Signed)
 Pt call returned to reschedule her colonoscopy from 03/20/24 with Dr. Baldomero Bone to 04/08/24 with Dr. Cornel Diesel due to Dr. Ludger [place] last day is 03/20/24.  Referral has been updated.  Instructions updated.  Thanks, [place], [place]

## 2024-02-27 NOTE — Assessment & Plan Note (Signed)
 Hypertension is well-controlled with current medication regimen. Blood pressure readings are satisfactory.

## 2024-02-28 ENCOUNTER — Encounter: Payer: Self-pay | Admitting: Family Medicine

## 2024-02-28 ENCOUNTER — Ambulatory Visit: Payer: Managed Care, Other (non HMO) | Admitting: Family Medicine

## 2024-02-28 LAB — COMPREHENSIVE METABOLIC PANEL WITH GFR
ALT: 16 IU/L (ref 0–32)
AST: 15 IU/L (ref 0–40)
Albumin: 4.9 g/dL (ref 3.8–4.9)
Alkaline Phosphatase: 102 IU/L (ref 44–121)
BUN/Creatinine Ratio: 18 (ref 12–28)
BUN: 13 mg/dL (ref 8–27)
Bilirubin Total: 1.1 mg/dL (ref 0.0–1.2)
CO2: 21 mmol/L (ref 20–29)
Calcium: 9.3 mg/dL (ref 8.7–10.3)
Chloride: 102 mmol/L (ref 96–106)
Creatinine, Ser: 0.71 mg/dL (ref 0.57–1.00)
Globulin, Total: 2.5 g/dL (ref 1.5–4.5)
Glucose: 85 mg/dL (ref 70–99)
Potassium: 4.7 mmol/L (ref 3.5–5.2)
Sodium: 140 mmol/L (ref 134–144)
Total Protein: 7.4 g/dL (ref 6.0–8.5)
eGFR: 97 mL/min/{1.73_m2} (ref >59)

## 2024-02-28 LAB — LIPID PANEL
Chol/HDL Ratio: 2.3 ratio (ref 0.0–4.4)
Cholesterol, Total: 109 mg/dL (ref 100–199)
HDL: 47 mg/dL (ref >39)
LDL Chol Calc (NIH): 49 mg/dL (ref 0–99)
Triglycerides: 59 mg/dL (ref 0–149)
VLDL Cholesterol Cal: 13 mg/dL (ref 5–40)

## 2024-03-20 ENCOUNTER — Ambulatory Visit: Admit: 2024-03-20 | Payer: Managed Care, Other (non HMO) | Admitting: Gastroenterology

## 2024-03-20 SURGERY — COLONOSCOPY WITH PROPOFOL
Anesthesia: General

## 2024-03-24 ENCOUNTER — Ambulatory Visit (INDEPENDENT_AMBULATORY_CARE_PROVIDER_SITE_OTHER): Admitting: Podiatry

## 2024-03-24 DIAGNOSIS — M21961 Unspecified acquired deformity of right lower leg: Secondary | ICD-10-CM | POA: Diagnosis not present

## 2024-03-24 DIAGNOSIS — M722 Plantar fascial fibromatosis: Secondary | ICD-10-CM | POA: Diagnosis not present

## 2024-03-24 DIAGNOSIS — M21962 Unspecified acquired deformity of left lower leg: Secondary | ICD-10-CM | POA: Diagnosis not present

## 2024-03-24 MED ORDER — MELOXICAM 15 MG PO TABS: 15.0000 mg | ORAL_TABLET | Freq: Every day | ORAL | 0 refills | Status: DC

## 2024-03-24 NOTE — Progress Notes (Signed)
 Subjective:  Patient ID: Ashley Ballard, female    DOB: 08-24-1966,  MRN: [id number]  Chief Complaint  Patient presents with   Plantar Fasciitis    Plantar fasciitis of left foot follow up pt stated that it is better than it was she still has some slight discomfort at times    58 y.o. female presents with the above complaint.  Patient presents with left plantar fasciitis.  She states that she is doing better.  She still has some residual pain.  Pain scale is 5 out of 10 dull aching nature.  Injection helped considerably but she is here to discuss next treatment plan   Review of Systems: Negative except as noted in the HPI. Denies N/V/F/Ch.  Past Medical History:  Diagnosis Date   Family history of adverse reaction to anesthesia    mother had problems   GERD (gastroesophageal reflux disease)    Hypertension     Current Outpatient Medications:    meloxicam (MOBIC) 15 MG tablet, Take 1 tablet (15 mg total) by mouth daily., Disp: 30 tablet, Rfl: 0   Na Sulfate-K Sulfate-Mg Sulfate concentrate (SUPREP) 17.5-3.13-1.6 GM/177ML SOLN, as directed., Disp: , Rfl:    amLODipine  (NORVASC ) 10 MG tablet, Take 1 tablet (10 mg total) by mouth daily. OV due in Spring 2025, Disp: 90 tablet, Rfl: 1   Ascorbic Acid (VITAMIN C) 1000 MG tablet, Take 2,000 mg by mouth daily., Disp: , Rfl:    calcium  carbonate (OSCAL) 1500 (600 Ca) MG TABS tablet, Take by mouth., Disp: , Rfl:    cetirizine  (ZYRTEC ) 10 MG tablet, TAKE 1 TABLET(10 MG) BY MOUTH DAILY, Disp: 90 tablet, Rfl: 3   Cholecalciferol (VITAMIN D -3) 5000 units TABS, Take 5,000 Units by mouth daily., Disp: , Rfl:    fluticasone  (FLONASE ) 50 MCG/ACT nasal spray, Place 2 sprays into both nostrils daily., Disp: 16 g, Rfl: 11   furosemide  (LASIX ) 20 MG tablet, Take 1 tablet (20 mg total) by mouth daily., Disp: 90 tablet, Rfl: 3   furosemide  (LASIX ) 20 MG tablet, Take 1 tablet (20 mg total) by mouth daily., Disp: 30 tablet, Rfl: 0   ibuprofen  (ADVIL ) 800 MG  tablet, Take 1 tablet (800 mg total) by mouth every 8 (eight) hours as needed. for pain, Disp: 270 tablet, Rfl: 1   potassium chloride  SA (KLOR-CON  M) 20 MEQ tablet, Take 1 tablet (20 mEq total) by mouth daily., Disp: 90 tablet, Rfl: 3   rosuvastatin  (CRESTOR ) 5 MG tablet, Take 1 tablet (5 mg total) by mouth daily., Disp: 90 tablet, Rfl: 3  Social History   Tobacco Use  Smoking Status Never  Smokeless Tobacco Never    Allergies  Allergen Reactions   Lisinopril  Itching    Mouth Itching   Hydrochlorothiazide  Rash   Objective:  There were no vitals filed for this visit. There is no height or weight on file to calculate BMI. Constitutional Well developed. Well nourished.  Vascular Dorsalis pedis pulses palpable bilaterally. Posterior tibial pulses palpable bilaterally. Capillary refill normal to all digits.  No cyanosis or clubbing noted. Pedal hair growth normal.  Neurologic Normal speech. Oriented to person, place, and time. Epicritic sensation to light touch grossly present bilaterally.  Dermatologic Nails well groomed and normal in appearance. No open wounds. No skin lesions.  Orthopedic: Normal joint ROM without pain or crepitus bilaterally. No visible deformities. Tender to palpation at the calcaneal tuber left. No pain with calcaneal squeeze left. Ankle ROM diminished range of motion left. Silfverskiold Test: positive  left.   Radiographs: None  Assessment:   1. Plantar fasciitis of left foot   2. Foot deformity, bilateral     Plan:  Patient was evaluated and treated and all questions answered.  Plantar Fasciitis, left - XR reviewed as above.  - Educated on icing and stretching. Instructions given.  - second Injection delivered to the plantar fascia as below. - DME: Plantar fascial brace dispensed to support the medial longitudinal arch of the foot and offload pressure from the heel and prevent arch collapse during weightbearing - Pharmacologic management:  None  Pes planovalgus/foot deformity -I explained to patient the etiology of pes planovalgus and relationship with Planter fasciitis and various treatment options were discussed.  Given patient foot structure in the setting of Planter fasciitis I believe patient will benefit from custom-made orthotics to help control the hindfoot motion support the arch of the foot and take the stress away from plantar fascial.  Patient agrees with the plan like to proceed with orthotics -Patient was casted for orthotics  Procedure: Injection Tendon/Ligament Location: Left plantar fascia at the glabrous junction; medial approach. Skin Prep: alcohol Injectate: 0.5 cc 0.5% marcaine plain, 0.5 cc of 1% Lidocaine , 0.5 cc kenalog  10. Disposition: Patient tolerated procedure well. Injection site dressed with a band-aid.  No follow-ups on file.

## 2024-04-06 ENCOUNTER — Telehealth: Payer: Self-pay

## 2024-04-06 NOTE — Telephone Encounter (Signed)
 Pt has requested to reschedule 04/08/24 colonoscopy due to busy summer with kids.  Colonoscopy has been rescheduled to 06/17/24 still with Dr. Cornel Diesel.  Trish in Endo notified.  Instructions updated referral updated.  Thanks,  [place] ,[place]

## 2024-04-14 ENCOUNTER — Other Ambulatory Visit: Payer: Self-pay | Admitting: Family Medicine

## 2024-04-14 DIAGNOSIS — I10 Essential (primary) hypertension: Secondary | ICD-10-CM

## 2024-04-21 ENCOUNTER — Ambulatory Visit (INDEPENDENT_AMBULATORY_CARE_PROVIDER_SITE_OTHER): Admitting: Podiatry

## 2024-04-21 DIAGNOSIS — M722 Plantar fascial fibromatosis: Secondary | ICD-10-CM | POA: Diagnosis not present

## 2024-04-21 NOTE — Progress Notes (Signed)
 Subjective:  Patient ID: Ashley Ballard, female    DOB: Jun 04, 1966,  MRN: [id number]  Chief Complaint  Patient presents with   Plantar Fasciitis    58 y.o. female presents with the above complaint.  Patient presents with left plantar fasciitis.  She states that she is doing a little bit better.  She still has residual pain.  Did not come down with a second injection.  She would like to discuss next treatment plan.  Review of Systems: Negative except as noted in the HPI. Denies N/V/F/Ch.  Past Medical History:  Diagnosis Date   Family history of adverse reaction to anesthesia    mother had problems   GERD (gastroesophageal reflux disease)    Hypertension     Current Outpatient Medications:    amLODipine  (NORVASC ) 10 MG tablet, TAKE 1 TABLET BY MOUTH DAILY, Disp: 90 tablet, Rfl: 3   Ascorbic Acid (VITAMIN C) 1000 MG tablet, Take 2,000 mg by mouth daily., Disp: , Rfl:    calcium  carbonate (OSCAL) 1500 (600 Ca) MG TABS tablet, Take by mouth., Disp: , Rfl:    cetirizine  (ZYRTEC ) 10 MG tablet, TAKE 1 TABLET(10 MG) BY MOUTH DAILY, Disp: 90 tablet, Rfl: 3   Cholecalciferol (VITAMIN D -3) 5000 units TABS, Take 5,000 Units by mouth daily., Disp: , Rfl:    fluticasone  (FLONASE ) 50 MCG/ACT nasal spray, Place 2 sprays into both nostrils daily., Disp: 16 g, Rfl: 11   furosemide  (LASIX ) 20 MG tablet, Take 1 tablet (20 mg total) by mouth daily., Disp: 90 tablet, Rfl: 3   furosemide  (LASIX ) 20 MG tablet, Take 1 tablet (20 mg total) by mouth daily., Disp: 30 tablet, Rfl: 0   ibuprofen  (ADVIL ) 800 MG tablet, Take 1 tablet (800 mg total) by mouth every 8 (eight) hours as needed. for pain, Disp: 270 tablet, Rfl: 1   meloxicam  (MOBIC ) 15 MG tablet, Take 1 tablet (15 mg total) by mouth daily., Disp: 30 tablet, Rfl: 0   Na Sulfate-K Sulfate-Mg Sulfate concentrate (SUPREP) 17.5-3.13-1.6 GM/177ML SOLN, as directed., Disp: , Rfl:    potassium chloride  SA (KLOR-CON  M) 20 MEQ tablet, Take 1 tablet (20 mEq total)  by mouth daily., Disp: 90 tablet, Rfl: 3   rosuvastatin  (CRESTOR ) 5 MG tablet, Take 1 tablet (5 mg total) by mouth daily., Disp: 90 tablet, Rfl: 3  Social History   Tobacco Use  Smoking Status Never  Smokeless Tobacco Never    Allergies  Allergen Reactions   Lisinopril  Itching    Mouth Itching   Hydrochlorothiazide  Rash   Objective:  There were no vitals filed for this visit. There is no height or weight on file to calculate BMI. Constitutional Well developed. Well nourished.  Vascular Dorsalis pedis pulses palpable bilaterally. Posterior tibial pulses palpable bilaterally. Capillary refill normal to all digits.  No cyanosis or clubbing noted. Pedal hair growth normal.  Neurologic Normal speech. Oriented to person, place, and time. Epicritic sensation to light touch grossly present bilaterally.  Dermatologic Nails well groomed and normal in appearance. No open wounds. No skin lesions.  Orthopedic: Normal joint ROM without pain or crepitus bilaterally. No visible deformities. Tender to palpation at the calcaneal tuber left. No pain with calcaneal squeeze left. Ankle ROM diminished range of motion left. Silfverskiold Test: positive left.   Radiographs: None  Assessment:   No diagnosis found.   Plan:  Patient was evaluated and treated and all questions answered.  Plantar Fasciitis, left - XR reviewed as above.  - Educated on icing and  stretching. Instructions given.  - Will hold off on further injection as they have not helped. - DME: Cam boot immobilization - Pharmacologic management: None  Pes planovalgus/foot deformity -I explained to patient the etiology of pes planovalgus and relationship with Planter fasciitis and various treatment options were discussed.  Given patient foot structure in the setting of Planter fasciitis I believe patient will benefit from custom-made orthotics to help control the hindfoot motion support the arch of the foot and take the  stress away from plantar fascial.  Patient agrees with the plan like to proceed with orthotics -Patient was casted for orthotics  Procedure: Injection Tendon/Ligament Location: Left plantar fascia at the glabrous junction; medial approach. Skin Prep: alcohol Injectate: 0.5 cc 0.5% marcaine plain, 0.5 cc of 1% Lidocaine , 0.5 cc kenalog  10. Disposition: Patient tolerated procedure well. Injection site dressed with a band-aid.  No follow-ups on file.

## 2024-04-23 ENCOUNTER — Telehealth: Payer: Self-pay

## 2024-04-23 NOTE — Telephone Encounter (Signed)
 Voice message has been left for patient to call office to reschedule her 06/17/24 colonoscopy with Dr. Marinda.  Dr. Marinda will be on vacation.  Thanks,  [place], CMA

## 2024-05-01 ENCOUNTER — Telehealth: Payer: Self-pay | Admitting: Podiatry

## 2024-05-01 NOTE — Telephone Encounter (Signed)
 Called pt to notify orthotics are here and they can get fitted when they see Dr. Tobie on 7/29  Orthotics placed in [place] bin

## 2024-05-19 ENCOUNTER — Ambulatory Visit (INDEPENDENT_AMBULATORY_CARE_PROVIDER_SITE_OTHER): Admitting: Podiatry

## 2024-05-19 DIAGNOSIS — M21961 Unspecified acquired deformity of right lower leg: Secondary | ICD-10-CM

## 2024-05-19 DIAGNOSIS — M21962 Unspecified acquired deformity of left lower leg: Secondary | ICD-10-CM | POA: Diagnosis not present

## 2024-05-19 DIAGNOSIS — M722 Plantar fascial fibromatosis: Secondary | ICD-10-CM

## 2024-05-19 NOTE — Progress Notes (Signed)
 Subjective:  Patient ID: Ashley Ballard, female    DOB: October 02, 1966,  MRN: [id number]  Chief Complaint  Patient presents with   Plantar Fasciitis    Pt stated that she is doing better than what she was     58 y.o. female presents with the above complaint.  Patient presents with left plantar fasciitis.  She states she is doing a lot better no further pain she would like to discuss next treatment plan denies any other acute issues  Review of Systems: Negative except as noted in the HPI. Denies N/V/F/Ch.  Past Medical History:  Diagnosis Date   Family history of adverse reaction to anesthesia    mother had problems   GERD (gastroesophageal reflux disease)    Hypertension     Current Outpatient Medications:    amLODipine  (NORVASC ) 10 MG tablet, TAKE 1 TABLET BY MOUTH DAILY, Disp: 90 tablet, Rfl: 3   Ascorbic Acid (VITAMIN C) 1000 MG tablet, Take 2,000 mg by mouth daily., Disp: , Rfl:    calcium  carbonate (OSCAL) 1500 (600 Ca) MG TABS tablet, Take by mouth., Disp: , Rfl:    cetirizine  (ZYRTEC ) 10 MG tablet, TAKE 1 TABLET(10 MG) BY MOUTH DAILY, Disp: 90 tablet, Rfl: 3   Cholecalciferol (VITAMIN D -3) 5000 units TABS, Take 5,000 Units by mouth daily., Disp: , Rfl:    fluticasone  (FLONASE ) 50 MCG/ACT nasal spray, Place 2 sprays into both nostrils daily., Disp: 16 g, Rfl: 11   furosemide  (LASIX ) 20 MG tablet, Take 1 tablet (20 mg total) by mouth daily., Disp: 90 tablet, Rfl: 3   furosemide  (LASIX ) 20 MG tablet, Take 1 tablet (20 mg total) by mouth daily., Disp: 30 tablet, Rfl: 0   ibuprofen  (ADVIL ) 800 MG tablet, Take 1 tablet (800 mg total) by mouth every 8 (eight) hours as needed. for pain, Disp: 270 tablet, Rfl: 1   meloxicam  (MOBIC ) 15 MG tablet, Take 1 tablet (15 mg total) by mouth daily., Disp: 30 tablet, Rfl: 0   Na Sulfate-K Sulfate-Mg Sulfate concentrate (SUPREP) 17.5-3.13-1.6 GM/177ML SOLN, as directed., Disp: , Rfl:    potassium chloride  SA (KLOR-CON  M) 20 MEQ tablet, Take 1 tablet  (20 mEq total) by mouth daily., Disp: 90 tablet, Rfl: 3   rosuvastatin  (CRESTOR ) 5 MG tablet, Take 1 tablet (5 mg total) by mouth daily., Disp: 90 tablet, Rfl: 3  Social History   Tobacco Use  Smoking Status Never  Smokeless Tobacco Never    Allergies  Allergen Reactions   Lisinopril  Itching    Mouth Itching   Hydrochlorothiazide  Rash   Objective:  There were no vitals filed for this visit. There is no height or weight on file to calculate BMI. Constitutional Well developed. Well nourished.  Vascular Dorsalis pedis pulses palpable bilaterally. Posterior tibial pulses palpable bilaterally. Capillary refill normal to all digits.  No cyanosis or clubbing noted. Pedal hair growth normal.  Neurologic Normal speech. Oriented to person, place, and time. Epicritic sensation to light touch grossly present bilaterally.  Dermatologic Nails well groomed and normal in appearance. No open wounds. No skin lesions.  Orthopedic: Normal joint ROM without pain or crepitus bilaterally. No visible deformities. No further tender to palpation at the calcaneal tuber left. No pain with calcaneal squeeze left. Ankle ROM diminished range of motion left. Silfverskiold Test: positive left.   Radiographs: None  Assessment:   No diagnosis found.   Plan:  Patient was evaluated and treated and all questions answered.  Plantar Fasciitis, left - Clinically healed with  cam boot immobilization no further plantar fasciitis symptoms noted.  At this time I encouraged stretching exercises wearing orthotics and shoe gear modification she states understanding if any foot and ankle issues in future she will come back and see me. Pes planovalgus/foot deformity -I explained to patient the etiology of pes planovalgus and relationship with Planter fasciitis and various treatment options were discussed.  Given patient foot structure in the setting of Planter fasciitis I believe patient will benefit from  custom-made orthotics to help control the hindfoot motion support the arch of the foot and take the stress away from plantar fascial.  Patient agrees with the plan like to proceed with orthotics - Orthotics were dispensed are functioning well no acute complaints    No follow-ups on file.

## 2024-05-27 ENCOUNTER — Encounter: Payer: Self-pay | Admitting: Family Medicine

## 2024-05-27 DIAGNOSIS — Z1211 Encounter for screening for malignant neoplasm of colon: Secondary | ICD-10-CM

## 2024-05-28 NOTE — Telephone Encounter (Signed)
 Per patient's chart note sent to PCP,    Ashley Ballard to P [facility] (supporting Jon CHRISTELLA Eva, MD)     05/27/24  2:12 PM Hey Dr B The gastro has cancelled my appt 3 times for scheduling conflicts. I was due to go the end of the month and they have cancelled that one. My question is can you refer me to a gastro dr so I can get an appointment for my procedure. I would rather speak to who is doing the procedure instead of anybody at this point after I have had the scheduling issue so many times    Thank you!  PCP has sent her referral to [facility].  Thanks, [place], CMA

## 2024-05-28 NOTE — Telephone Encounter (Signed)
 Ok to send GI referral to [facility] instead of [facility]

## 2024-06-17 ENCOUNTER — Ambulatory Visit: Admission: RE | Admit: 2024-06-17 | Source: Home / Self Care | Admitting: General Surgery

## 2024-06-17 SURGERY — COLONOSCOPY
Anesthesia: General

## 2024-09-01 ENCOUNTER — Encounter: Payer: Self-pay | Admitting: Family Medicine

## 2024-09-01 ENCOUNTER — Ambulatory Visit (INDEPENDENT_AMBULATORY_CARE_PROVIDER_SITE_OTHER): Admitting: Family Medicine

## 2024-09-01 VITALS — BP 137/78 | HR 89 | Ht 65.5 in | Wt 242.6 lb

## 2024-09-01 DIAGNOSIS — I10 Essential (primary) hypertension: Secondary | ICD-10-CM | POA: Diagnosis not present

## 2024-09-01 DIAGNOSIS — E559 Vitamin D deficiency, unspecified: Secondary | ICD-10-CM

## 2024-09-01 DIAGNOSIS — M722 Plantar fascial fibromatosis: Secondary | ICD-10-CM

## 2024-09-01 DIAGNOSIS — E7800 Pure hypercholesterolemia, unspecified: Secondary | ICD-10-CM

## 2024-09-01 DIAGNOSIS — J301 Allergic rhinitis due to pollen: Secondary | ICD-10-CM

## 2024-09-01 DIAGNOSIS — G8929 Other chronic pain: Secondary | ICD-10-CM

## 2024-09-01 DIAGNOSIS — E538 Deficiency of other specified B group vitamins: Secondary | ICD-10-CM

## 2024-09-01 DIAGNOSIS — R600 Localized edema: Secondary | ICD-10-CM

## 2024-09-01 MED ORDER — CETIRIZINE HCL 10 MG PO TABS: ORAL_TABLET | ORAL | 3 refills | Status: AC

## 2024-09-01 MED ORDER — IBUPROFEN 800 MG PO TABS: 800.0000 mg | ORAL_TABLET | Freq: Three times a day (TID) | ORAL | 1 refills | Status: AC | PRN

## 2024-09-01 MED ORDER — FUROSEMIDE 20 MG PO TABS: 20.0000 mg | ORAL_TABLET | Freq: Every day | ORAL | 3 refills | Status: AC

## 2024-09-01 MED ORDER — POTASSIUM CHLORIDE CRYS ER 20 MEQ PO TBCR
20.0000 meq | EXTENDED_RELEASE_TABLET | Freq: Every day | ORAL | 3 refills | Status: AC
Start: 2024-09-01 — End: ?

## 2024-09-01 NOTE — Progress Notes (Signed)
 Established patient visit   Patient: Ashley Ballard   DOB: 07/29/1966   58 y.o. Female  MRN: [id number] Visit Date: 09/01/2024  Today's healthcare provider: Jon Eva, MD   Chief Complaint  Patient presents with   Medical Management of Chronic Issues    Taking medications as prescribed with no side effects or symptoms to report. Would like to think about influenza and pneumococcal vaccines. Has an appointment next week for colonoscopy.    Hypertension   Hyperlipidemia   Obesity   Subjective    Hypertension  Hyperlipidemia   HPI     Medical Management of Chronic Issues    Additional comments: Taking medications as prescribed with no side effects or symptoms to report. Would like to think about influenza and pneumococcal vaccines. Has an appointment next week for colonoscopy.       Last edited by Lilian Fitzpatrick, CMA on 09/01/2024  8:34 AM.       Discussed the use of AI scribe software for clinical note transcription with the patient, who gave verbal consent to proceed.  History of Present Illness   Ashley Ballard is a 58 year old female with hypertension, morbid obesity, and hyperlipidemia who presents for a six-month follow-up.  She is currently taking amlodipine  10 mg daily, Lasix  20 mg daily with a potassium supplement, and Crestor  5 mg daily without any issues. Swelling is managed with compression socks due to prolonged sitting.  Plantar fasciitis initially affected her left foot in April and then her right foot, impacting her ability to exercise and contributing to a weight gain of about ten pounds. She can walk almost a mile and a half in thirty minutes and is working on increasing her pace. She uses orthotics, performs foot stretches, and utilizes a roller ball and a band for toe stretching.  Her cholesterol levels were excellent in May, and her vitamin D  levels were good within the last year.       Medications: Outpatient Medications Prior to  Visit  Medication Sig   amLODipine  (NORVASC ) 10 MG tablet TAKE 1 TABLET BY MOUTH DAILY   Ascorbic Acid (VITAMIN C) 1000 MG tablet Take 2,000 mg by mouth daily.   calcium  carbonate (OSCAL) 1500 (600 Ca) MG TABS tablet Take by mouth.   Cholecalciferol (VITAMIN D -3) 5000 units TABS Take 5,000 Units by mouth daily.   fluticasone  (FLONASE ) 50 MCG/ACT nasal spray Place 2 sprays into both nostrils daily.   ibuprofen  (ADVIL ) 800 MG tablet Take 1 tablet (800 mg total) by mouth every 8 (eight) hours as needed. for pain   rosuvastatin  (CRESTOR ) 5 MG tablet Take 1 tablet (5 mg total) by mouth daily.   [DISCONTINUED] cetirizine  (ZYRTEC ) 10 MG tablet TAKE 1 TABLET(10 MG) BY MOUTH DAILY   [DISCONTINUED] furosemide  (LASIX ) 20 MG tablet Take 1 tablet (20 mg total) by mouth daily.   [DISCONTINUED] potassium chloride  SA (KLOR-CON  M) 20 MEQ tablet Take 1 tablet (20 mEq total) by mouth daily.   [DISCONTINUED] furosemide  (LASIX ) 20 MG tablet Take 1 tablet (20 mg total) by mouth daily. (Patient not taking: Reported on 09/01/2024)   [DISCONTINUED] meloxicam  (MOBIC ) 15 MG tablet Take 1 tablet (15 mg total) by mouth daily. (Patient not taking: Reported on 09/01/2024)   [DISCONTINUED] Na Sulfate-K Sulfate-Mg Sulfate concentrate (SUPREP) 17.5-3.13-1.6 GM/177ML SOLN as directed. (Patient not taking: Reported on 09/01/2024)   No facility-administered medications prior to visit.    Review of Systems     Objective  BP 137/78 (BP Location: Left Arm, Patient Position: Sitting, Cuff Size: Large)   Pulse 89   Ht 5' 5.5 (1.664 m)   Wt 242 lb 9.6 oz (110 kg)   LMP 11/19/2015   SpO2 100%   BMI 39.76 kg/m    Physical Exam Vitals reviewed.  Constitutional:      General: She is not in acute distress.    Appearance: Normal appearance. She is well-developed. She is not diaphoretic.  HENT:     Head: Normocephalic and atraumatic.  Eyes:     General: No scleral icterus.    Conjunctiva/sclera: Conjunctivae normal.   Neck:     Thyroid: No thyromegaly.  Cardiovascular:     Rate and Rhythm: Normal rate and regular rhythm.     Heart sounds: Normal heart sounds. No murmur heard. Pulmonary:     Effort: Pulmonary effort is normal. No respiratory distress.     Breath sounds: Normal breath sounds. No wheezing, rhonchi or rales.  Musculoskeletal:     Cervical back: Neck supple.     Right lower leg: No edema.     Left lower leg: No edema.  Lymphadenopathy:     Cervical: No cervical adenopathy.  Skin:    General: Skin is warm and dry.     Findings: No rash.  Neurological:     Mental Status: She is alert and oriented to person, place, and time. Mental status is at baseline.  Psychiatric:        Mood and Affect: Mood normal.        Behavior: Behavior normal.      No results found for any visits on 09/01/24.  Assessment & Plan     Problem List Items Addressed This Visit       Cardiovascular and Mediastinum   Essential hypertension - Primary   Blood pressure is well-controlled with current medication regimen. No issues reported with amlodipine  and Lasix . - Continue amlodipine  10 mg daily - Continue Lasix  20 mg daily - Ordered microalbuminuria test to screen for early kidney damage      Relevant Medications   furosemide  (LASIX ) 20 MG tablet   Other Relevant Orders   Comprehensive metabolic panel with GFR   Urine Microalbumin w/creat. ratio     Other   Morbid obesity (HCC)   BMI 39 and assoc with HTN and HLD Weight gain noted, attributed to decreased activity due to plantar fasciitis. She is attempting to increase physical activity and manage weight. - Encouraged continued physical activity as tolerated      Relevant Orders   CBC w/Diff/Platelet   Hemoglobin A1c   Avitaminosis D   Relevant Orders   VITAMIN D  25 Hydroxy (Vit-D Deficiency, Fractures)   B12 deficiency   Relevant Orders   B12   Pure hypercholesterolemia   Cholesterol levels were excellent in May. Current medication  regimen appears effective. - Continue Crestor  5 mg daily - Ordered cholesterol panel      Relevant Medications   furosemide  (LASIX ) 20 MG tablet   Other Relevant Orders   Comprehensive metabolic panel with GFR   Lipid panel   Other Visit Diagnoses       Seasonal allergic rhinitis due to pollen       Relevant Medications   cetirizine  (ZYRTEC ) 10 MG tablet     Localized edema       Relevant Medications   furosemide  (LASIX ) 20 MG tablet     Plantar fasciitis  Plantar fasciitis, bilateral Bilateral plantar fasciitis with recent exacerbation affecting both feet. She is using orthotics and performing foot stretches. Progressing with walking but experiencing some discomfort. - Continue orthotics and foot stretches - Advised icing feet after walking - Encouraged gradual increase in walking pace  Vitamin D  deficiency and B vitamin deficiency Vitamin D  levels were excellent in the last check. B12 levels to be re-evaluated. - Ordered vitamin D  and B12 levels  Allergic rhinitis due to pollen - Continue current management  General Health Maintenance Discussion of flu and pneumonia vaccinations. She is considering the pneumonia vaccine and plans to get the flu shot at [facility]. - Consider pneumonia vaccine - Get flu shot at [facility]       Return in about 6 months (around 03/01/2025) for CPE.       Jon Eva, MD  [facility] [phone] (phone) [phone] (fax)  [facility]

## 2024-09-01 NOTE — Assessment & Plan Note (Addendum)
 BMI 39 and assoc with HTN and HLD Weight gain noted, attributed to decreased activity due to plantar fasciitis. She is attempting to increase physical activity and manage weight. - Encouraged continued physical activity as tolerated

## 2024-09-01 NOTE — Assessment & Plan Note (Signed)
 Cholesterol levels were excellent in May. Current medication regimen appears effective. - Continue Crestor  5 mg daily - Ordered cholesterol panel

## 2024-09-01 NOTE — Assessment & Plan Note (Signed)
 Blood pressure is well-controlled with current medication regimen. No issues reported with amlodipine  and Lasix . - Continue amlodipine  10 mg daily - Continue Lasix  20 mg daily - Ordered microalbuminuria test to screen for early kidney damage

## 2024-09-05 LAB — LIPID PANEL
Chol/HDL Ratio: 2.5 ratio (ref 0.0–4.4)
Cholesterol, Total: 119 mg/dL (ref 100–199)
HDL: 47 mg/dL (ref >39)
LDL Chol Calc (NIH): 57 mg/dL (ref 0–99)
Triglycerides: 76 mg/dL (ref 0–149)
VLDL Cholesterol Cal: 15 mg/dL (ref 5–40)

## 2024-09-05 LAB — CBC WITH DIFFERENTIAL/PLATELET
Basophils Absolute: 0 x10E3/uL (ref 0.0–0.2)
Basos: 0 %
EOS (ABSOLUTE): 0.2 x10E3/uL (ref 0.0–0.4)
Eos: 3 %
Hematocrit: 40.1 % (ref 34.0–46.6)
Hemoglobin: 13.1 g/dL (ref 11.1–15.9)
Immature Grans (Abs): 0 x10E3/uL (ref 0.0–0.1)
Immature Granulocytes: 0 %
Lymphocytes Absolute: 1.8 x10E3/uL (ref 0.7–3.1)
Lymphs: 33 %
MCH: 31.5 pg (ref 26.6–33.0)
MCHC: 32.7 g/dL (ref 31.5–35.7)
MCV: 96 fL (ref 79–97)
Monocytes Absolute: 0.5 x10E3/uL (ref 0.1–0.9)
Monocytes: 10 %
Neutrophils Absolute: 3 x10E3/uL (ref 1.4–7.0)
Neutrophils: 54 %
Platelets: 221 x10E3/uL (ref 150–450)
RBC: 4.16 x10E6/uL (ref 3.77–5.28)
RDW: 11.6 % — ABNORMAL LOW (ref 11.7–15.4)
WBC: 5.5 x10E3/uL (ref 3.4–10.8)

## 2024-09-05 LAB — COMPREHENSIVE METABOLIC PANEL WITH GFR
ALT: 132 IU/L — ABNORMAL HIGH (ref 0–32)
AST: 78 IU/L — ABNORMAL HIGH (ref 0–40)
Albumin: 4.7 g/dL (ref 3.8–4.9)
Alkaline Phosphatase: 88 IU/L (ref 49–135)
BUN/Creatinine Ratio: 14 (ref 12–28)
BUN: 11 mg/dL (ref 8–27)
Bilirubin Total: 1.1 mg/dL (ref 0.0–1.2)
CO2: 22 mmol/L (ref 20–29)
Calcium: 9.3 mg/dL (ref 8.7–10.3)
Chloride: 104 mmol/L (ref 96–106)
Creatinine, Ser: 0.77 mg/dL (ref 0.57–1.00)
Globulin, Total: 2.8 g/dL (ref 1.5–4.5)
Glucose: 89 mg/dL (ref 70–99)
Potassium: 4.1 mmol/L (ref 3.5–5.2)
Sodium: 141 mmol/L (ref 134–144)
Total Protein: 7.5 g/dL (ref 6.0–8.5)
eGFR: 88 mL/min/1.73 (ref >59)

## 2024-09-05 LAB — HEMOGLOBIN A1C
Est. average glucose Bld gHb Est-mCnc: 97 mg/dL
Hgb A1c MFr Bld: 5 % (ref 4.8–5.6)

## 2024-09-05 LAB — VITAMIN B12: Vitamin B-12: 718 pg/mL (ref 232–1245)

## 2024-09-05 LAB — MICROALBUMIN / CREATININE URINE RATIO
Creatinine, Urine: 7.2 mg/dL
Microalbumin, Urine: <3 ug/mL

## 2024-09-05 LAB — VITAMIN D 25 HYDROXY (VIT D DEFICIENCY, FRACTURES): Vit D, 25-Hydroxy: 49.6 ng/mL (ref 30.0–100.0)

## 2024-09-07 ENCOUNTER — Ambulatory Visit: Payer: Self-pay | Admitting: Family Medicine

## 2024-09-07 ENCOUNTER — Other Ambulatory Visit: Payer: Self-pay

## 2024-09-07 DIAGNOSIS — I10 Essential (primary) hypertension: Secondary | ICD-10-CM

## 2024-09-07 NOTE — Progress Notes (Signed)
 Last read by Zariel B Maciejewski at 1:06PM on 09/07/2024.

## 2024-10-19 ENCOUNTER — Other Ambulatory Visit: Payer: Self-pay

## 2024-10-19 DIAGNOSIS — I10 Essential (primary) hypertension: Secondary | ICD-10-CM

## 2024-10-20 ENCOUNTER — Ambulatory Visit: Payer: Self-pay | Admitting: Family Medicine

## 2024-10-20 LAB — COMPREHENSIVE METABOLIC PANEL WITH GFR
ALT: 26 IU/L (ref 0–32)
AST: 25 IU/L (ref 0–40)
Albumin: 4.5 g/dL (ref 3.8–4.9)
Alkaline Phosphatase: 92 IU/L (ref 49–135)
BUN/Creatinine Ratio: 13 (ref 12–28)
BUN: 11 mg/dL (ref 8–27)
Bilirubin Total: 1.4 mg/dL — ABNORMAL HIGH (ref 0.0–1.2)
CO2: 20 mmol/L (ref 20–29)
Calcium: 9.4 mg/dL (ref 8.7–10.3)
Chloride: 106 mmol/L (ref 96–106)
Creatinine, Ser: 0.82 mg/dL (ref 0.57–1.00)
Globulin, Total: 2.7 g/dL (ref 1.5–4.5)
Glucose: 92 mg/dL (ref 70–99)
Potassium: 4.8 mmol/L (ref 3.5–5.2)
Sodium: 140 mmol/L (ref 134–144)
Total Protein: 7.2 g/dL (ref 6.0–8.5)
eGFR: 82 mL/min/1.73

## 2024-10-20 LAB — MICROALBUMIN / CREATININE URINE RATIO
Creatinine, Urine: 18.9 mg/dL
Microalb/Creat Ratio: <16 mg/g{creat} (ref 0–29)
Microalbumin, Urine: <3 ug/mL

## 2024-10-20 LAB — SPECIMEN STATUS REPORT

## 2024-10-31 ENCOUNTER — Encounter: Payer: Self-pay | Admitting: Family Medicine

## 2024-10-31 DIAGNOSIS — J328 Other chronic sinusitis: Secondary | ICD-10-CM

## 2024-11-02 MED ORDER — FLUTICASONE PROPIONATE 50 MCG/ACT NA SUSP: 2.0000 | Freq: Every day | NASAL | 11 refills | Status: AC

## 2024-11-19 ENCOUNTER — Other Ambulatory Visit: Payer: Self-pay | Admitting: Family Medicine

## 2025-03-01 ENCOUNTER — Ambulatory Visit: Admitting: Family Medicine
